# Patient Record
Sex: Male | Born: 1960 | Race: White | Hispanic: No | Marital: Married | Smoking: Never smoker
Health system: Southern US, Academic
[De-identification: ages and names within clinical notes are randomized; demographics above are authoritative.]

## PROBLEM LIST (undated history)

## (undated) DIAGNOSIS — S99919A Unspecified injury of unspecified ankle, initial encounter: Secondary | ICD-10-CM

## (undated) DIAGNOSIS — T148XXA Other injury of unspecified body region, initial encounter: Secondary | ICD-10-CM

## (undated) DIAGNOSIS — E785 Hyperlipidemia, unspecified: Secondary | ICD-10-CM

## (undated) DIAGNOSIS — S8990XA Unspecified injury of unspecified lower leg, initial encounter: Secondary | ICD-10-CM

## (undated) DIAGNOSIS — R42 Dizziness and giddiness: Secondary | ICD-10-CM

## (undated) DIAGNOSIS — K219 Gastro-esophageal reflux disease without esophagitis: Secondary | ICD-10-CM

## (undated) DIAGNOSIS — Z9989 Dependence on other enabling machines and devices: Secondary | ICD-10-CM

## (undated) DIAGNOSIS — Z973 Presence of spectacles and contact lenses: Secondary | ICD-10-CM

## (undated) DIAGNOSIS — G4730 Sleep apnea, unspecified: Secondary | ICD-10-CM

## (undated) DIAGNOSIS — E119 Type 2 diabetes mellitus without complications: Secondary | ICD-10-CM

## (undated) HISTORY — PX: HX CATARACT REMOVAL: SHX102

## (undated) HISTORY — PX: HX HERNIA REPAIR: SHX51

## (undated) HISTORY — DX: Unspecified injury of unspecified ankle, initial encounter: S99.919A

## (undated) HISTORY — PX: HX ORCHIOPEXY: SHX67

## (undated) HISTORY — PX: HX OTHER: 2100001105

## (undated) HISTORY — DX: Other injury of unspecified body region, initial encounter: T14.8XXA

## (undated) HISTORY — PX: HX WISDOM TEETH EXTRACTION: SHX21

## (undated) HISTORY — PX: HX TONSILLECTOMY: SHX27

## (undated) HISTORY — PX: KNEE ARTHROSCOPY: SUR90

## (undated) HISTORY — DX: Unspecified injury of unspecified lower leg, initial encounter: S89.90XA

---

## 1999-01-16 ENCOUNTER — Ambulatory Visit (HOSPITAL_COMMUNITY): Payer: Self-pay

## 1999-02-01 ENCOUNTER — Ambulatory Visit (INDEPENDENT_AMBULATORY_CARE_PROVIDER_SITE_OTHER): Payer: Self-pay | Admitting: NEUROSURGERY

## 1999-03-01 ENCOUNTER — Ambulatory Visit (INDEPENDENT_AMBULATORY_CARE_PROVIDER_SITE_OTHER): Payer: Self-pay | Admitting: NEUROSURGERY

## 2003-01-26 ENCOUNTER — Ambulatory Visit (HOSPITAL_COMMUNITY): Payer: Self-pay

## 2003-11-03 ENCOUNTER — Emergency Department (HOSPITAL_COMMUNITY): Payer: Self-pay | Admitting: EXTERNAL

## 2007-12-20 ENCOUNTER — Ambulatory Visit (INDEPENDENT_AMBULATORY_CARE_PROVIDER_SITE_OTHER): Payer: Worker's Compensation | Admitting: PREVENTIVE MEDICINE

## 2007-12-20 DIAGNOSIS — K429 Umbilical hernia without obstruction or gangrene: Secondary | ICD-10-CM

## 2007-12-26 NOTE — H&P (Signed)
[facility] Department of Occupational Medicine  [address]  [place], [place] [address]      INDEPENDENT MEDICAL EVALUATION    PATIENT NAME: [name], [name]  CHART NUMBER: [id number]  DATE OF BIRTH: 03-27-1961  DATE OF SERVICE: 12/20/2007    CLAIM NUMBER:   [id number]  DATE OF INJURY:  08/27/2007    At the onset of our visit, the claimant was reminded they are here for an independent medical evaluation. The claimant was advised that we are not, nor would we become, their treating physician under this claim. Additionally, the claimant was advised that whatever information they provided in relation to this claim would be included in our report.  The claimant was asked, as the examination proceeded, to perform maneuvers to the best of their ability; however, they were not to perform any maneuver which might result in further injury.      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  While he was lifting the hood on his truck, he felt a tearing sensation in his abdomen and developed pain in his abdomen suddenly.  That pain persisted and went to the emergency department at [facility].  They diagnosed him with an umbilical hernia.  He was referred to Dr. Okey Dupre, a surgeon, who a few weeks later took him to the operating room and did surgery in late January 2009 with a mesh repair.  He had a few postop followup visits with Dr. Okey Dupre, but developed no complications and then on 10/14/2007, he returned to work without limitations.    At the present time, he has no complaints and no limitations in his ability to perform his usual work.    REVIEW OF SYSTEMS:  A complete review of systems is available in the claimant's POC chart.  Pertinent positives include history of  earaches and some sinus difficulties.  He does have a history of fractures, plus the hernia, which he lists under musculoskeletal, but with this hernia, no disk problems.  He does have a history of weight gain and reports frequent worry.  The balance of his review of systems is negative.    CURRENT MEDICATIONS:  None.      ALLERGIES:  He reports no drug, food or environmental allergies.    SOCIAL HISTORY:  The claimant denies use of alcohol, having quit in 1988.  Denies use of smoked and smokeless tobacco, as well as illicit drugs.  He reports he has his bachelor's degree.  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  He did serve in [employer] for four years.  He is not retired and is not on Hydrologist.    PAST SURGICAL HISTORY:  History of broken thumb, the hernia, and wisdom teeth removal.    FAMILY HISTORY:  Father deceased from an injury and Mother deceased from cancer.  He has no siblings.    PHYSICAL EXAMINATION:  The claimant was alert and cooperative with evaluation.  His vital signs include a height of 5 feet 8 inches, weight 346 pounds, temperature 37.3, pulse 80, respirations 16 and blood pressure 144/84.      Examination was limited to the belly starting with inspection.  In an arc just inferior to the umbilicus, there is a scar that is approximately 7 cm in overall length.  This scar is still red.  It is not raised or hypertrophied and was normally mobile.  Palpation of the area about the belly button finds that there is a palpable nodule to the right and superior to the umbilicus that is approximately 1 cm in diameter, this was somewhat rubbery in consistency.  Careful palpation of the area around the umbilicus, I was not able to identify any defect in the abdominal wall that persists.    REVIEW OF PROVIDED MEDICAL RECORDS:  Provided medical records start with a referral letter, although I do not seem to have any questions.   I just have the scheduling letter with the claimant from [facility].  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 as a Nurse, mental health.  The physician diagnosis was of abdominal pain with abdominal herniation and the type of injury was stretched skin, 553.9.      The attending physician's report dated 10/01/2007, reflects that he had repair of an incarcerated umbilical hernia with Ventralex dual mesh patch of 6.5 cm in diameter on 09/13/2007 and seen postop 09/18/2007.  He had some Cipro perioperatively and Vicodin with soreness and inability to bend.      I have the pathology report dated 09/13/2007, and their impression was that it was incarcerated omentum from an umbilical hernia.      The operative report dated 09/13/2007 reflects the diagnosis of an umbilical hernia, incarcerated, with protuberant 3 inches with excoriated skin and then the postoperative diagnosis was the same with also 2 cm fascial defect found.  The operation was repair of incarcerated umbilical hernia with Ventralex dual mesh patch 6.5 cm in diameter done by Dr. Brayton El.      I have appropriate operative report and postoperative followup notes plus emergency department note reflecting their diagnosis of, I believe, it says medvulate hernia.      Return to work notice reflects return to work on 10/21/2007.      I do have the referral letter, dated 11/20/2007, which asks the three standard questions.    DIAGNOSIS:  Umbilical hernia, status post repair.    PROGNOSIS:  This gentleman has returned to his pre-injury job at full duty and has been able to do that for the last two months without limitation. I have no reason to expect that this would change.    REVIEW AND RECOMMENDATIONS:  1.  DOES THE CLAIMANT HAVE A WORK-RELATED INJURY OR ILLNESS?  It does seem quite reasonable that the type of work he was doing with having to lift the hood of a vehicle could result in  an umbilical hernia or certainly incarceration of an umbilical hernia, such as this gentleman had.  I have no evidence suggesting another cause, although his morbid obesity could certainly contribute to the development of such a condition.    2.  TREATMENTS AND/OR CONSULTATIONS NEEDED:  At this point, I do not believe any additional treatment or consultations will be necessary for this gentleman.    3.  IS THE CLAIMANT AT MAXIMUM MEDICAL IMPROVEMENT?  It is the opinion of this evaluating physician that this claimant is at maximum medical improvement with relation to the injuries covered under this claim.    4.  CLAIMANT'S WORK STATUS:  This gentleman has returned to his pre-injury job  and is doing that at full duty and has for the last several months.    5.  LIMITATIONS.  At this point, this gentleman's limitations would be none.      6.  WOULD THE CLAIMANT BENEFIT FROM WORK CONDITIONING OR WORK HARDENING?  I do not believe this gentleman would benefit from work conditioning or work hardening as he has returned to his pre-injury job without limitations.    7.  DOES THE CLAIMANT CURRENTLY MEET THE CRITERIA FOR TTD?  At this point, I believe this gentleman is eligible for neither medical nor vocational TTD benefits.    8.  PERMANENT IMPAIRMENT CALCULATIONS:  This claimant was evaluated in accordance with the AMA Guides to the Evaluation of Permanent Impairment, the Fourth Edition.  There are two residual defects that need to be considered.      The first of these would be the abdominal wall defect itself.  Abdominal wall hernias are evaluated using Table 7 on Page 243 of The Guides.  To qualify for Class 1 requires a palpable defect of supporting structures of the abdominal wall and a slight protrusion defect with increased abdominal pressure, easily reducible, or occasional mild discomfort at the site of the defect, but not precluding normal activity.  My examination reveals that this gentleman does not have a  palpable defect and also does have very occasional discomfort that does not preclude any activities.  Therefore, I believe we could place him in Class 1.  Class 1 does allow a 0-9% impairment of the whole person.  It is my opinion that there is a 0% impairment of the whole person.  This gentleman is quite similar to the example offered for Class 1 further down Page 247.    The second defect that must be considered is that to his skin from the incision.  This claimant had a 7-cm curvilinear scar that is an area that would be exposed by normal male bathing attire.  Evaluation of skin disorders is done using Table 2 on Page 280 of The Guides, and you would place him in Class 1 of that table.  There is a long series of examples of findings of skin changes, and it is my opinion that the scar, which is in a potentially visible area, which I expect will continue to become less and less visible as we get further out from the surgery is most similar to the examples listed on Page 281 of The Guides, which yields a 0% impairment of the whole person.  This gentleman has a 0% impairment of the whole person for his scar.      In summary, a 0% impairment of the whole person for abdominal wall defect combined with 0% impairment of the whole person for skin defects and that yields a 0% impairment of the whole person for all injuries covered under this claim.    9.  WOULD THE CLAIMANT BENEFIT FROM A VOCATIONAL REHABILITATION ASSESSMENT?  At this point, vocational rehabilitation would not be necessary for this gentleman as he has returned to his pre-injury employment at full duty.      Verlin Dike III, DO  Assistant Clinical Professor  [facility] Department of Occupational Medicine    MV/HQI/[id number]; D: 12/22/2007 84:13:24; T: 12/22/2007 08:48:38  Time:  Records 15 minutes. IME 1.25 Hours

## 2009-12-17 ENCOUNTER — Emergency Department (EMERGENCY_DEPARTMENT_HOSPITAL): Payer: Self-pay

## 2009-12-17 ENCOUNTER — Emergency Department (EMERGENCY_DEPARTMENT_HOSPITAL): Admission: EM | Admit: 2009-12-17 | Discharge: 2009-12-17 | Payer: Self-pay

## 2010-07-20 ENCOUNTER — Observation Stay (HOSPITAL_BASED_OUTPATIENT_CLINIC_OR_DEPARTMENT_OTHER): Payer: 59 | Admitting: HOSPITALIST

## 2010-07-20 ENCOUNTER — Emergency Department (EMERGENCY_DEPARTMENT_HOSPITAL): Payer: 59

## 2010-07-20 ENCOUNTER — Observation Stay
Admission: EM | Admit: 2010-07-20 | Discharge: 2010-07-22 | Disposition: A | Discharge status: Home / Self Care | Payer: 59 | Attending: HOSPITALIST | Admitting: HOSPITALIST

## 2010-07-20 ENCOUNTER — Encounter (HOSPITAL_COMMUNITY): Payer: Self-pay

## 2010-07-20 DIAGNOSIS — R072 Precordial pain: Principal | ICD-10-CM | POA: Insufficient documentation

## 2010-07-20 DIAGNOSIS — E785 Hyperlipidemia, unspecified: Secondary | ICD-10-CM | POA: Insufficient documentation

## 2010-07-20 DIAGNOSIS — E119 Type 2 diabetes mellitus without complications: Secondary | ICD-10-CM | POA: Insufficient documentation

## 2010-07-20 DIAGNOSIS — I10 Essential (primary) hypertension: Secondary | ICD-10-CM | POA: Insufficient documentation

## 2010-07-20 DIAGNOSIS — G4730 Sleep apnea, unspecified: Secondary | ICD-10-CM | POA: Insufficient documentation

## 2010-07-20 HISTORY — DX: Hyperlipidemia, unspecified: E78.5

## 2010-07-20 HISTORY — DX: Sleep apnea, unspecified: G47.30

## 2010-07-20 HISTORY — DX: Morbid (severe) obesity due to excess calories: E66.01

## 2010-07-20 LAB — BASIC METABOLIC PANEL
ANION GAP: 7 mmol/L (ref 5–16)
BUN/CREAT RATIO: 15 (ref 6–22)
BUN: 15 mg/dL (ref 8–26)
CALCIUM: 8.2 mg/dL — ABNORMAL LOW (ref 8.5–10.4)
CARBON DIOXIDE: 24 mmol/L (ref 23–33)
CHLORIDE: 106 mmol/L (ref 96–111)
CREATININE: 1.01 mg/dL (ref 0.62–1.27)
ESTIMATED GLOMERULAR FILTRATION RATE: >59 ml/min/1.73m2 (ref >59)
GLUCOSE,NONFAST: 132 mg/dL (ref 65–139)
POTASSIUM: 5.7 mmol/L — ABNORMAL HIGH (ref 3.5–5.1)
SODIUM: 137 mmol/L (ref 136–145)

## 2010-07-20 LAB — VENOUS BLOOD GAS/CO-OX - OP ONLY
%FIO2: 97 %
BASE DEFICIT: 0.6 mmol/L (ref 0.0–2.0)
BICARBONATE: 23.5 mmol/L (ref 22.0–26.0)
CARBOHYHEMOGLOBIN: 1.6 % — ABNORMAL HIGH (ref 0.0–1.5)
HEMATOCRIT: 46 % (ref 39.8–50.2)
HEMOGLOBIN: 15.4 g/dL (ref 13.5–18.0)
MET-HEMOGLOBIN: 0 % (ref 0.0–3.0)
O2CT: 14.6 (ref 7.5–17.5)
O2HB: 67.7 % (ref 40.0–70.0)
PCO2: 43 mmHg (ref 41.0–51.0)
PH: 7.37 (ref 7.310–7.410)
PO2: 41 mmHg (ref 35–50)

## 2010-07-20 LAB — CBC/DIFF
BASOPHILS: 0 % (ref 0–1)
BASOS ABS: 0.024 THOU/uL (ref 0.0–0.2)
EOS ABS: 0.135 THOU/uL (ref 0.1–0.3)
EOSINOPHIL: 1 % (ref 1–6)
HCT: 43.3 % (ref 39.8–50.2)
HGB: 14.3 g/dL (ref 13.1–17.3)
LYMPHOCYTES: 24 % (ref 20–45)
LYMPHS ABS: 2.64 THOU/uL (ref 1.0–4.8)
MCH: 29.9 pg (ref 27.4–33.0)
MCHC: 33 g/dL (ref 31.6–35.5)
MCV: 90.5 fL (ref 82.0–99.0)
MONOCYTES: 5 % (ref 4–13)
MONOS ABS: 0.552 THOU/uL (ref 0.1–0.9)
MPV: 7 FL — ABNORMAL LOW (ref 7.4–10.4)
NRBC'S: 0 /100{WBCs}
PLATELET COUNT: 278 10*3/uL (ref 140–450)
PMN ABS: 7.64 THOU/uL (ref 1.5–7.7)
PMN'S: 70 % (ref 40–75)
RBC: 4.79 MIL/uL (ref 4.46–5.70)
RDW: 13.2 % (ref 10.2–14.0)
WBC: 11 THOU/uL (ref 3.5–11.0)

## 2010-07-20 LAB — PT/INR
INR: 1 (ref 0.8–1.2)
PROTHROMBIN TIME: 10.8 s (ref 9.1–11.5)

## 2010-07-20 LAB — CREATINE KINASE (CK), MB FRACTION, SERUM
CK-MB: 2.4 ng/mL (ref <6.4)
MB INDEX: 1.4 % (ref 0.0–5.0)

## 2010-07-20 LAB — D-DIMER: D-DIMER (QUANT): 0.33 mg{FEU}/L (ref <0.59)

## 2010-07-20 LAB — TROPONIN-I (FOR ED ONLY): TROPONIN-I: <0.01 ng/mL (ref <0.030)

## 2010-07-20 LAB — CREATINE KINASE (CK), TOTAL, SERUM OR PLASMA: CREATINE KINASE (CK): 177 U/L (ref 48–222)

## 2010-07-20 LAB — PTT (PARTIAL THROMBOPLASTIN TIME): APTT: 21.3 s — ABNORMAL LOW (ref 22.0–32.0)

## 2010-07-20 MED ORDER — HYDROMORPHONE (PF) 1 MG/ML INJECTION SOLUTION
0.2000 mg | INTRAMUSCULAR | Status: DC | PRN
Start: 2010-07-20 — End: 2010-07-23

## 2010-07-20 MED ORDER — OLMESARTAN 20 MG TABLET
40.0000 mg | ORAL_TABLET | Freq: Every day | ORAL | Status: DC
Start: 2010-07-21 — End: 2010-07-23
  Administered 2010-07-21 – 2010-07-22 (×2): 40 mg via ORAL
  Filled 2010-07-20 (×2): qty 2

## 2010-07-20 MED ORDER — ENOXAPARIN 40 MG/0.4 ML SUBCUTANEOUS SYRINGE
40.0000 mg | INJECTION | SUBCUTANEOUS | Status: DC
Start: 2010-07-21 — End: 2010-07-23
  Administered 2010-07-21 – 2010-07-22 (×2): 40 mg via SUBCUTANEOUS
  Filled 2010-07-20 (×3): qty 0.4

## 2010-07-20 MED ORDER — NITROGLYCERIN 0.4 MG SUBLINGUAL TABLET
0.4000 mg | SUBLINGUAL_TABLET | SUBLINGUAL | Status: DC | PRN
Start: 2010-07-20 — End: 2010-07-23

## 2010-07-20 MED ORDER — SODIUM CHLORIDE 0.9 % INTRAVENOUS SOLUTION
INTRAVENOUS | Status: DC
Start: 2010-07-21 — End: 2010-07-21

## 2010-07-20 MED ORDER — HYDROCHLOROTHIAZIDE 25 MG TABLET
25.0000 mg | ORAL_TABLET | Freq: Every day | ORAL | Status: DC
Start: 2010-07-21 — End: 2010-07-21
  Administered 2010-07-21: 25 mg via ORAL
  Filled 2010-07-20: qty 1

## 2010-07-20 MED ORDER — SODIUM CHLORIDE 0.9 % (FLUSH) INJECTION SYRINGE
0.5000 mL | INJECTION | INTRAMUSCULAR | Status: DC | PRN
Start: 2010-07-20 — End: 2010-07-23

## 2010-07-20 MED ORDER — OMEGA-3 FATTY ACIDS-VITAMIN E 1,000 MG CAPSULE
1000.00 mg | ORAL_CAPSULE | Freq: Two times a day (BID) | ORAL | Status: DC
Start: 2010-07-21 — End: 2010-07-23
  Administered 2010-07-21 (×2): 0 mg via ORAL
  Administered 2010-07-21: 1000 mg via ORAL
  Administered 2010-07-22: 0 mg via ORAL
  Filled 2010-07-20 (×5): qty 1

## 2010-07-20 MED ORDER — SENNOSIDES 8.6 MG-DOCUSATE SODIUM 50 MG TABLET
1.0000 | ORAL_TABLET | Freq: Two times a day (BID) | ORAL | Status: DC | PRN
Start: 2010-07-20 — End: 2010-07-23
  Filled 2010-07-20 (×3): qty 1

## 2010-07-20 MED ORDER — POLYETHYLENE GLYCOL 3350 17 GRAM ORAL POWDER PACKET
17.0000 g | Freq: Every day | ORAL | Status: DC | PRN
Start: 2010-07-20 — End: 2010-07-23
  Filled 2010-07-20 (×3): qty 1

## 2010-07-20 MED ORDER — MULTIVITAMIN TABLET
1.0000 | ORAL_TABLET | Freq: Every day | ORAL | Status: DC
Start: 2010-07-21 — End: 2010-07-23
  Administered 2010-07-21 – 2010-07-22 (×2): 1 via ORAL
  Filled 2010-07-20 (×2): qty 1

## 2010-07-20 MED ORDER — ASPIRIN 81 MG TABLET
81.0000 mg | ORAL_TABLET | Freq: Every day | ORAL | Status: DC
Start: 2010-07-21 — End: 2010-07-20

## 2010-07-20 MED ORDER — DOCUSATE SODIUM 100 MG CAPSULE
100.0000 mg | ORAL_CAPSULE | Freq: Two times a day (BID) | ORAL | Status: DC | PRN
Start: 2010-07-20 — End: 2010-07-23
  Filled 2010-07-20 (×3): qty 1

## 2010-07-20 MED ORDER — SODIUM CHLORIDE 0.9 % (FLUSH) INJECTION SYRINGE
1.0000 mL | INJECTION | Freq: Three times a day (TID) | INTRAMUSCULAR | Status: DC
Start: 2010-07-20 — End: 2010-07-23
  Administered 2010-07-21: 0 mL
  Administered 2010-07-21 (×2): 1 mL
  Administered 2010-07-21: 0 mL
  Administered 2010-07-22: 1 mL

## 2010-07-20 MED ORDER — ONDANSETRON HCL (PF) 4 MG/2 ML INJECTION SOLUTION
4.0000 mg | Freq: Four times a day (QID) | INTRAMUSCULAR | Status: DC | PRN
Start: 2010-07-20 — End: 2010-07-23

## 2010-07-20 MED ORDER — ASPIRIN 81 MG CHEWABLE TABLET
81.0000 mg | CHEWABLE_TABLET | Freq: Every day | ORAL | Status: DC
Start: 2010-07-21 — End: 2010-07-23
  Administered 2010-07-21 – 2010-07-22 (×2): 81 mg via ORAL
  Filled 2010-07-20 (×2): qty 1

## 2010-07-20 MED ORDER — INSULIN LISPRO 100 UNIT/ML SUB-Q SSIP
2.00 [IU] | INJECTION | Freq: Four times a day (QID) | SUBCUTANEOUS | Status: DC | PRN
Start: 2010-07-20 — End: 2010-07-23
  Administered 2010-07-21 – 2010-07-22 (×3): 4 [IU] via SUBCUTANEOUS
  Filled 2010-07-20: qty 3

## 2010-07-20 NOTE — ED Nurses Note (Signed)
1930  Pt in bed, alert and oriented x3.  Pt denies chest pain.  Pt c/o back pain, rate 1 on pain scale 0-10.  Denies shortness of breath.  Monitor showing sr rate 94.  Skin warm, dry and pink.  o2 sat 96% on ra. Pt verbalized wanting to eat. Pt stated he has not eaten since 8am.  Notified  Resident of pt's needs. Order received to feed pt.  Medicine consulted for admission.  Hm

## 2010-07-20 NOTE — H&P (Signed)
[facility]   General Medicine  Admission H&P    Date of Service:  07/20/2010  [name], [name], 49 y.o. 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  The patient had not exerted himself prior to the episode of pain.   The patient notes the pain felt like a pressure, as if someone was push against his chest wall.  Notes a generalized tingling feeling, but no focal weakness or numbness.  Denies jaw pain or arm pain.  No similar previous episodes noted.  Notes some nausea, but no vomiting or change in bowel habits.  Denies abdominal pain.  No SOB.  Denies cough, fever, chills or diaphoresis.  The pain resolved when he received nitroglycerin SL x 2.  The patient denies previous cardiac disease.  The patient has noted risk factors of morbid obesity, diabetes, HTN, and hyperlipidemia.  He denies a family history of CAD and tobacco use.  The patient notes he had a stress test a "few years back", but cannot recall when, but he states the test was "normal".          PAST MEDICAL:    Past Medical History   Diagnosis Date   . HTN    . Diabetes    . Sleep apnea    . Hyperlipidemia    . Morbid obesity        Past Surgical History   Procedure Date   . Hx hernia repair      inguinal and umbilical repair    . Hx wisdom teeth extraction        Medication Sig   . Liraglutide (VICTOZA) 0.6 mg/0.1 mL (18 mg/3 mL) Subcutaneous Pen Injector 1.2 mg by Subcutaneous route Once a day.   . Metformin (GLUCOPHAGE) 1,000 mg Oral Tablet take 1,000 mg by mouth Twice daily with food.   . Olmesartan-Hydrochlorothiazide (BENICAR HCT) 40-12.5 mg Oral Tablet take 1 Tab by mouth Once a day.    . Glipizide (GLUCOTROL) 10 mg Oral Tablet take 10 mg by mouth Daily before Breakfast. Take 30 minutes before meals    . Aspirin 81 mg Oral Tablet take 81 mg by mouth Once a day.   . Omega-3 Fatty Acids-Vitamin E (FISH OIL) 1,000 mg Oral Capsule take 1,000 mg by mouth Twice daily.   . multivitamin Oral Tablet take 1 Tab by mouth Once a day.   . hydrochlorothiazide (HYDRODIURIL) 25 mg Oral Tablet take 25 mg by mouth Once a day.     Allergies   Allergen Reactions   . Tylox (Oxycodone-acetaminophen)        Family History  Family History   Problem Relation Age of Onset   . Diabetes Paternal Uncle        Social History  History   Substance Use Topics   . Smoking status: Never Smoker    . Smokeless tobacco: Not on file   . Alcohol Use: No          ROS: Other than ROS in the HPI, all other systems were negative.    Examination:  Temperature: 37 C (98.6 F)  Heart Rate: 96   BP (Non-Invasive): 142/65 mmHg  Respiratory  Rate: 20   SpO2-1: 96 %  Pain Score: 5  General: morbidly obese and no distress  Eyes: Conjunctiva clear.  HENT:Head atraumatic and normocephalic  Neck: No JVD  Lungs: Clear to auscultation bilaterally.   Cardiovascular: regular rate and rhythm  S1, S2 normal     no reproducible chest pain no noted murmurs  Abdomen: Soft, non-tender, Bowel sounds normal, obese  Genito-urinary: Deferred  Extremities: No cyanosis or edema  Skin: Skin warm and dry  Neurologic: Grossly normal  Psychiatric: Normal    Labs:    I have reviewed all lab results.    EKG:  SR with RsR`, left atrial abnormality, and non-specific t wave changes     Imaging Studies:  I have reviewed all imaging studies.      DNR Status: Full Code     Assessment/Plan:   Active Hospital Problems   (*Primary Problem)   Diagnoses   . 1Chest pain   . HTN (hypertension)     Chronic   . Diabetes mellitus     Chronic   . Hyperlipidemia     Chronic   . Morbid obesity     Chronic   . Sleep apnea     Chronic        1.  Chest pain, may be cardiac in nature, currently asymptomatic, however, with patient's multiple risk factors (morbid obesity, HTN, hyperlipidemia, and diabetes), will plan to check serial cardiac enzymes and MPS in AM.  Plan for NPO after midnight.  Will check A1C and lipid panel in AM.      2.  HTN, chronic, continue home therapy  3.  Diabetes, will hold oral agents, fingersticks and sliding scale, check A1C in Am  4.  Hyperlipidemia, patient states he is not on statin, will continue omega fish oil, plan to check lipid profile in AM, may need statin therapy  5.  Sleep apnea, will continue CPAP at night  6.  Morbid obesity, will place on diabetic diet     DVT/PE Prophylaxis: Lovenox    Sanjuana Mae, MD 07/20/2010, 9:49 PM

## 2010-07-20 NOTE — ED Nurses Note (Signed)
Pt report called to 7E and given to nurse Lolita Lenz, RN.  hm

## 2010-07-20 NOTE — ED Provider Notes (Signed)
HPI Comments:           [name] is a 49 y.o. Male ho presents to the ER with symptom of cheatpain and back pain. He states that about an hour ago while he was driving he developed suddent onset of chest pain and back pain. He states that his cheat pain was retrosternal,dull aching,3/10 intensity along with nausea. Back pain was cramping pain,10/10 intensity,radiating to shoulders. He states he also had blurring of vision and whole body numbness.    Non SOB,abdominal pain,bladder and bowel disturbances,seizurs,LOS,fever,headaches.    Past Medical History:    HTN                                                           DIABETES                                                      SLEEP APNEA                                                   Past Surgical History:    Hx hernia repair                                              Hx wisdom teeth extraction                                    Family history   Mother had cancer  Uncle- Diabetes and HTN    Social History: Live with is wife and daughter. Works as a Naval architect. Has mild erectile dysfunction. Denies nicotine,alcohol and illicit drug use.    Current outpatient prescriptions:  Liraglutide (VICTOZA) 0.6 mg/0.1 mL (18 mg/3 mL) Subcutaneous Pen Injector, 1.2 mg by Subcutaneous route Once a day.  Metformin (GLUCOPHAGE) 1,000 mg Oral Tablet, take 1,000 mg by mouth Twice daily with food.  Olmesartan-Hydrochlorothiazide (BENICAR HCT) 40-12.5 mg Oral Tablet, take 1 Tab by mouth Once a day.  Glipizide (GLUCOTROL) 10 mg Oral Tablet, take 10 mg by mouth Daily before Breakfast. Take 30 minutes before meals   Aspirin 81 mg Oral Tablet, take 81 mg by mouth Once a day.  Omega-3 Fatty Acids-Vitamin E (FISH OIL) 1,000 mg Oral Capsule, take 1,000 mg by mouth Twice daily.  multivitamin Oral Tablet, take 1 Tab by mouth Once a day.  hydrochlorothiazide (HYDRODIURIL) 25 mg Oral Tablet, take 25 mg by mouth Once a day.           Review of Systems    Cardiovascular: Positive for chest pain.   Musculoskeletal: Positive for back pain.   All other systems reviewed and are negative.      Physical Exam   Constitutional: He is oriented to person, place, and time. He appears well-developed and well-nourished.   HENT:  Head: Normocephalic and atraumatic.   Eyes: Conjunctivae and EOM are normal. Pupils are equal, round, and reactive to light.   Neck: Normal range of motion. Neck supple.   Cardiovascular: Regular rhythm, normal heart sounds and intact distal pulses.  Tachycardia present.    Pulmonary/Chest: Effort normal and breath sounds normal.   Abdominal: Soft.   Musculoskeletal: Normal range of motion.   Neurological: He is alert and oriented to person, place, and time. No cranial nerve deficit.   Skin: Skin is warm.   Psychiatric: He has a normal mood and affect. His behavior is normal. Judgment and thought content normal.     ED Course:    Results:  Recent Results   -CBC/DIFF     WBC (THOU/uL)                 11.0      Low: 3.5  High: 11.0     RBC (MIL/uL)                  4.79      Low: 4.46  High: 5.70     HGB (g/dL)                    16.1      Low: 13.1  High: 17.3     HCT (%)                       43.3      Low: 39.8  High: 50.2     MCV (fL)                      90.5      Low: 82.0  High: 99.0     MCH (pg)                      29.9      Low: 27.4  High: 33.0     MCHC (g/dL)                   09.6      Low: 31.6  High: 35.5     RDW (%)                       13.2      Low: 10.2  High: 14.0     PLATELET COUNT (THOU/uL)      278       Low: 140  High: 450     MPV (FL)                      7.0 (*)   Low: 7.4  High: 10.4     PMN'S (%)                     70        Low: 40   High: 75     PMN ABS (THOU/uL)             7.640     Low: 1.5  High: 7.7     LYMPHOCYTES (%)               24        Low: 20   High: 45     LYMPHS ABS (THOU/uL)          2.640     Low:  1.0  High: 4.8     MONOCYTES (%)                 5         Low: 4    High: 13      MONOS ABS (THOU/uL)           0.552     Low: 0.1  High: 0.9     EOSINOPHIL (%)                1         Low: 1    High: 6      EOS ABS (THOU/uL)             0.135     Low: 0.1  High: 0.3     BASOPHILS (%)                 0         Low: 0    High: 1      BASOS ABS (THOU/uL)           0.024     Low: 0.0  High: 0.2     NRBC'S (/100WBC)              0       Range: 0    -BASIC METABOLIC PANEL, NON-FASTING     SODIUM (mmol/L)               137       Low: 136  High: 145     POTASSIUM (mmol/L)            5.7 (*)   Low: 3.5  High: 5.1     CHLORIDE (mmol/L)             106       Low: 96   High: 111     CARBON DIOXIDE (mmol/L)       24        Low: 23   High: 33     ANION GAP (mmol/L)            7         Low: 5    High: 16     CREATININE (mg/dL)            5.40      Low: 0.62  High: 1.27     ESTIMATED GLOMERULAR FILTRATION RA* (ml/min/1.44m2)  >59     Range: >59     GLUCOSE,NONFAST (mg/dL)       981       Low: 65   High: 139     BUN (mg/dL)                   15        Low: 8    High: 26     BUN/CREAT RATIO               15        Low: 6    High: 22     CALCIUM (mg/dL)               8.2 (*)   Low: 8.5  High: 10.4    -PT/INR     PROTHROMBIN TIME (Sec)        10.8      Low: 9.1  High: 11.5     INR  1.0       Low: 0.8  High: 1.2    -ROUTINE PTT     aPTT (Sec)                    21.3 (*)  Low: 22.0  High: 32.0    -TROPONIN-I (FOR ED ONLY)     TROPONIN-I (ng/mL)            <0.010  Range: <0.030    -CREATINE KINASE (CK), TOTAL     CREATINE KINASE (CK) (U/L)    177       Low: 48   High: 222    -CREATINE KINASE (CK) MB ISOENZYME     CK-MB (ng/mL)                 2.4     Range: <6.4     MB INDEX (%)                  1.4       Low: 0.0  High: 5.0    -D-DIMER     D-DIMER (QUANT) (mg/L FEU)    0.33    Range: <0.59    -VENOUS BLOOD GAS/COOXIMETRY     %FIO2 (%)                     97                               PH                            7.370     Low: 7.310  High: 7.410      PCO2 (mm Hg)                  43.0      Low: 41.0  High: 51.0     PO2 (mm Hg)                   41        Low: 35   High: 50     BICARBONATE (mmol/L)          23.5      Low: 22.0  High: 26.0     BASE EXCESS (mmol/L)                    Low: 0.0  High: 2.0                   Value:Test Not Performed     BASE DEFICIT (mmol/L)         0.6       Low: 0.0  High: 2.0     Specimen Type                 VENOUS                           HEMOGLOBIN (g/dL)             16.1      Low: 13.5  High: 18.0     O2HB (%)                      67.7      Low: 40.0  High: 70.0  CARBOHYHEMOGLOBIN (%)         1.6 (*)   Low: 0.0  High: 1.5     MET-HEMOGLOBIN (%)            0.0       Low: 0.0  High: 3.0     O2CT                          14.6      Low: 7.5  High: 17.5     RTCXV SPEC TYPE               VENOUS                           HEMATOCRIT (%)                46        Low: 39.8  High: 50.2      ED Course:  XR AP MOBILE CHEST  CBC/DIFF  BASIC METABOLIC PANEL, NON-FASTING  PT/INR  PTT (PARTIAL THROMBOPLASTIN TIME)  TROPONIN-I (FOR ED ONLY)  CPK (CK)  CK ISOENZYMES ONLY  D-DIMER  VENOUS BLOOD GAS/COOXIMETRY  ECG 12-LEAD  2 doses of sublingual nitrates given and patient states his chest pain is completely gone and has very minimal back pain.  Repeat EKG showed no progression or any significant changes.  Medicine consulted    Evaluation / Plan:  Disposition- Patient will be admitted to medicine for further evaluation and management.    Heath Gold, MD 07/21/2010, 12:13 AM

## 2010-07-20 NOTE — ED Nurses Note (Signed)
Pt given bag lunch, ate 100%.  Medicine consult at bedside. hm

## 2010-07-20 NOTE — ED Nurses Note (Signed)
1548:  Patient is alert and oriented x 3.  Respirations are even and non-labored.  Skin is pink, warm and dry.  Patient states he suddenly developed chest pain this evening while driving.  Patient denies shortness of breath, diaphoresis, nausea and vomiting.  Patient states the pain was relieved with NTG administered by EMS prior to his arrival here in the ED.  Patient placed on cardiac monitor, dinamapp, and pulse ox.  Patient made aware to notify me if chest pain reoccurs.  IV site, left ac, established by EMS.      1630:  Patient continues to deny chest pain.  Will continue to monitor.    1729:  Patient continues to deny chest pain.  Patient ambulated to restroom.    1908:  Patient care transferred with verbal nursing report to Lucia Gaskins, RN.

## 2010-07-20 NOTE — ED Nurses Note (Signed)
2140  Pt calm and relax, sitting up in bed. Monitor showing sr rate 90.  Given diet sprite as requested.  Hm

## 2010-07-20 NOTE — ED Attending Note (Signed)
I saw and examined the patient.  I directly supervised the patient's care.  I discussed the patient's care with the resident and I agree with the findings, diagnosis, and plan as documented in the resident's note.  Any exceptions, additions, or corrections are noted below.  49 yo M presents with sudden chest and back pain. Aching, nonradiating. Pain in the middle of his upper back also. Chest pain now gone after NTG.  Hx htn, DM.   Bedside US - no R DVT, heart without effusion, gross aortic root dilation, squeeze grossly OK, no obvious RV dilation  Will admit for rule out  MDM - re-evaluated pt, hemo monitoring, reviewed old records, reviewed labs, reviewed imaging, reviewed and interpreted ECGs, discussed care with consulting service, IV therapy was given

## 2010-07-21 ENCOUNTER — Inpatient Hospital Stay (HOSPITAL_COMMUNITY): Payer: 59

## 2010-07-21 LAB — CBC/DIFF
BASOPHILS: 0 % (ref 0–1)
BASOS ABS: 0.014 THOU/uL (ref 0.0–0.2)
EOS ABS: 0.171 THOU/uL (ref 0.1–0.3)
EOSINOPHIL: 2 % (ref 1–6)
HCT: 41.5 % (ref 39.8–50.2)
HGB: 13.8 g/dL (ref 13.1–17.3)
LYMPHOCYTES: 31 % (ref 20–45)
LYMPHS ABS: 3.11 THOU/uL (ref 1.0–4.8)
MCH: 30.1 pg (ref 27.4–33.0)
MCHC: 33.3 g/dL (ref 31.6–35.5)
MCV: 90.6 fL (ref 82.0–99.0)
MONOCYTES: 6 % (ref 4–13)
MONOS ABS: 0.599 THOU/uL (ref 0.1–0.9)
MPV: 7 FL — ABNORMAL LOW (ref 7.4–10.4)
NRBC'S: 0 /100{WBCs}
PLATELET COUNT: 276 THOU/uL (ref 140–450)
PMN ABS: 6.16 THOU/uL (ref 1.5–7.7)
PMN'S: 61 % (ref 40–75)
RBC: 4.58 MIL/uL (ref 4.46–5.70)
RDW: 13.1 % (ref 10.2–14.0)
WBC: 10.1 THOU/uL (ref 3.5–11.0)

## 2010-07-21 LAB — BASIC METABOLIC PANEL
ANION GAP: 11 mmol/L (ref 5–16)
BUN/CREAT RATIO: 16 (ref 6–22)
BUN: 15 mg/dL (ref 8–26)
CALCIUM: 8.3 mg/dL — ABNORMAL LOW (ref 8.5–10.4)
CARBON DIOXIDE: 24 mmol/L (ref 23–33)
CHLORIDE: 103 mmol/L (ref 96–111)
CREATININE: 0.96 mg/dL (ref 0.62–1.27)
ESTIMATED GLOMERULAR FILTRATION RATE: >59 ml/min/1.73m2 (ref >59)
GLUCOSE,NONFAST: 207 mg/dL — ABNORMAL HIGH (ref 65–139)
POTASSIUM: 3.2 mmol/L — ABNORMAL LOW (ref 3.5–5.1)
SODIUM: 138 mmol/L (ref 136–145)

## 2010-07-21 LAB — PHOSPHORUS: PHOSPHORUS: 2.3 mg/dL — ABNORMAL LOW (ref 2.4–4.7)

## 2010-07-21 LAB — LIPID PANEL
CHOLESTEROL: 162 mg/dL (ref <200)
HDL-CHOLESTEROL: 28 mg/dL — ABNORMAL LOW (ref >39)
LDL (CALCULATED): 79 mg/dL (ref <100)
NON - HDL (CALCULATED): 134 mg/dL (ref <190)
TRIGLYCERIDES: 274 mg/dL — ABNORMAL HIGH (ref <150)
VLDL (CALCULATED): 55 mg/dL — ABNORMAL HIGH (ref <30)

## 2010-07-21 LAB — HGA1C (HEMOGLOBIN A1C WITH EST AVG GLUCOSE)
ESTIMATED AVERAGE GLUCOSE: 206 mg/dL
HEMOGLOBIN A1C: 8.8 % — ABNORMAL HIGH (ref 4.0–6.0)

## 2010-07-21 LAB — TROPONIN-I
TROPONIN-I: <0.01 ng/mL (ref <0.030)
TROPONIN-I: <0.01 ng/mL (ref <0.030)
TROPONIN-I: <0.01 ng/mL (ref <0.030)

## 2010-07-21 LAB — PERFORM POC WHOLE BLOOD GLUCOSE
GLUCOSE, POINT OF CARE: 209 mg/dL — ABNORMAL HIGH (ref 70–105)
GLUCOSE, POINT OF CARE: 219 mg/dL — ABNORMAL HIGH (ref 70–105)

## 2010-07-21 LAB — MAGNESIUM: MAGNESIUM: 2.1 mg/dL (ref 1.7–2.5)

## 2010-07-21 MED ORDER — REGADENOSON 0.4 MG/5 ML INTRAVENOUS SYRINGE
0.40 mg | INJECTION | INTRAVENOUS | Status: AC
Start: 2010-07-21 — End: 2010-07-21
  Administered 2010-07-21: 0.4 mg via INTRAVENOUS

## 2010-07-21 MED ORDER — NITROGLYCERIN 0.4 MG SUBLINGUAL TABLET
0.40 mg | SUBLINGUAL_TABLET | SUBLINGUAL | Status: DC | PRN
Start: 2010-07-21 — End: 2011-12-04

## 2010-07-21 MED ORDER — REGADENOSON 0.4 MG/5 ML INTRAVENOUS SYRINGE
INJECTION | INTRAVENOUS | Status: AC
Start: 2010-07-21 — End: 2010-07-21
  Filled 2010-07-21: qty 5

## 2010-07-21 MED ORDER — METOPROLOL TARTRATE 25 MG TABLET
25.00 mg | ORAL_TABLET | Freq: Two times a day (BID) | ORAL | Status: DC
Start: 2010-07-21 — End: 2010-07-23
  Administered 2010-07-21 – 2010-07-22 (×2): 25 mg via ORAL
  Filled 2010-07-21 (×5): qty 1

## 2010-07-21 MED ORDER — NITROGLYCERIN 0.4 MG SUBLINGUAL TABLET
0.4000 mg | SUBLINGUAL_TABLET | SUBLINGUAL | Status: DC | PRN
Start: 2010-07-21 — End: 2010-07-21

## 2010-07-21 MED ORDER — POTASSIUM CHLORIDE ER 20 MEQ TABLET,EXTENDED RELEASE(PART/CRYST)
40.0000 meq | ORAL_TABLET | Freq: Once | ORAL | Status: AC
Start: 2010-07-21 — End: 2010-07-21
  Administered 2010-07-21: 40 meq via ORAL
  Filled 2010-07-21: qty 2

## 2010-07-21 MED ORDER — METOPROLOL TARTRATE 25 MG TABLET
25.00 mg | ORAL_TABLET | Freq: Two times a day (BID) | ORAL | Status: DC
Start: 2010-07-21 — End: 2018-04-03

## 2010-07-21 NOTE — Progress Notes (Signed)
[facility]  Discharge Day Note    [name]  Date of service: 07/21/2010  Date of Admission:  07/20/2010  Hospital Day:  LOS: 1 day       Examination at discharge:  Vital Signs:  Temperature: 36.9 C (98.4 F) (07/21/10 1558)  Heart Rate: 84  (07/21/10 1558)  BP (Non-Invasive): 129/69 mmHg (07/21/10 1558)  Respiratory Rate: 18  (07/21/10 1558)  SpO2-1: 96 % (07/21/10 1049)  Pain Score: 0 (07/21/10 1049)  General: no distress  HENT:Head atraumatic and normocephalic, ENT without erythema or injection, mucouse membranes moist.  Neck: No JVD or thyromegaly or lymphadenopathy  Lungs: Clear to auscultation bilaterally.   Cardiovascular: regular rate and rhythm, S1, S2 normal, no murmur, click, rub or gallop  Abdomen: Soft, non-tender, Bowel sounds normal, non-distended  Extremities: No cyanosis or edema  Neurologic: Grossly normal    Assessment/ Plan:   Active Hospital Problems   (*Primary Problem)   Diagnoses   . 1Chest pain   . HTN (hypertension)     Chronic   . Diabetes mellitus     Chronic   . Hyperlipidemia     Chronic   . Morbid obesity     Chronic   . Sleep apnea     Chronic       Discharge home.   Follow up with PCP and Cardiology.   Instructions given.   Start metoprolol and nitroglycerin prn.     Disposition : Home discharge      Darylene Price, MD 07/21/2010, 5:40 PM

## 2010-07-21 NOTE — Respiratory Therapy (Signed)
Placed pt on c-pap for sleep apnea.

## 2010-07-21 NOTE — Nurses Notes (Addendum)
Pt denies chest pain. Completed stress test. Pt radioactive till 9AM tomorrow Service notified test is completed  and diet requested. Assessment per [employer].

## 2010-07-21 NOTE — Progress Notes (Addendum)
[facility]  Medicine Progress Note    [name]  Date of service: 07/21/2010  Date of Admission:  07/20/2010    Hospital Day:  LOS: 1 day   Subjective:  Still complaining of funny sensation in the chest, but better than yesterday. Stress test results mentioned, wants to go home tomorrow, as he has no ride tonight.     Vital Signs:  Temp (24hrs) Max:37.2 C (99 F)      Systolic (24hrs), Avg:132 mmHg, Min:124 mmHg, Max:143 mmHg    Diastolic (24hrs), Avg:68 mmHg, Min:60 mmHg, Max:84 mmHg    Temp  Avg: 36.8 C (98.2 F)  Min: 36 C (96.8 F)  Max: 37.2 C (99 F)  Pulse  Avg: 88.5   Min: 81   Max: 96   Resp  Avg: 18.7   Min: 16   Max: 20   SpO2  Avg: 95.8 %  Min: 95 %  Max: 97 %  Pain Score: 0  Fi02    I/O:  I/O last 24 hours:    Intake/Output Summary (Last 24 hours) at 07/21/10 1854  Last data filed at 07/21/10 1500   Gross per 24 hour   Intake   1000 ml   Output   1100 ml   Net   -100 ml         Current facility-administered medications:  metoprolol (LOPRESSOR) tablet  25 mg Oral Q12H   multivitamin tablet  1 Tab Oral Daily   olmesartan (BENICAR) tablet  40 mg Oral Daily   Omega-3 Fatty Acids-Vitamin E 1,000 mg Cap 1,000 mg 1,000 mg Oral 2x/day   enoxaparin (LOVENOX) 40 mg/0.4 mL SubQ injection  40 mg Subcutaneous Q24H   docusate sodium (COLACE) capsule  100 mg Oral 2x/day PRN   polyethylene glycol (MIRALAX) oral packet  17 g Oral Daily PRN   sennosides-docusate sodium (SENOKOT-S) 8.6-50mg  per tablet  1 Tab Oral 2x/day PRN   ondansetron (ZOFRAN) 2 mg/mL injection  4 mg Intravenous Q6H PRN   NS 10 mL injection  1 mL Intracatheter Q8HRS   NS 10 mL injection  0.5 mL Intracatheter Q1 MIN PRN   HYDROmorphone (DILAUDID) 1 mg/mL injection  0.2 mg Intravenous Q4H PRN   nitroglycerin (NITROSTAT) sublingual tablet  0.4 mg Sublingual Q5 Min PRN   SSIP insulin lispro human (HUMALOG) 100 units/mL injection  2-6 Units Subcutaneous 4x/day PRN   aspirin chewable tablet 81 mg 81 mg Oral Daily          Allergies   Allergen Reactions   . Tylox (Oxycodone-acetaminophen)          Physical Exam:    General: no distress, moderately obese.  Lungs: Bilateral air entry present, Clear.   Cardiovascular: regular rate and rhythm, S1, S2 normal, no murmur, click, rub or gallop  Abdomen: Soft, non-tender, Bowel sounds normal, non-distended  Extremities: No cyanosis or edema      Labs:  Lab Results for Last 24 Hours:    Results for orders placed during the hospital encounter of 07/20/10 (from the past 24 hour(s))   TROPONIN-I    Collection Time    07/20/10 11:00 PM   Component Value Range   . TROPONIN-I <0.010  <0.030 (ng/mL)   HGA1C (HEMOGLOBIN A1C WITH EST AVG GLUCOSE)    Collection Time    07/20/10 11:00 PM   Component Value Range   . HEMOGLOBIN A1C 8.8 (*) 4.0 - 6.0 (%)   . ESTIMATED AVERAGE GLUCOSE 206  (mg/dL)  TROPONIN-I    Collection Time    07/21/10  3:00 AM   Component Value Range   . TROPONIN-I <0.010  <0.030 (ng/mL)   CBC/DIFF    Collection Time    07/21/10  6:00 AM   Component Value Range   . WBC 10.1  3.5 - 11.0 (THOU/uL)   . RBC 4.58  4.46 - 5.70 (MIL/uL)   . HGB 13.8  13.1 - 17.3 (g/dL)   . HCT 41.5  39.8 - 50.2 (%)   . MCV 90.6  82.0 - 99.0 (fL)   . MCH 30.1  27.4 - 33.0 (pg)   . MCHC 33.3  31.6 - 35.5 (g/dL)   . RDW 13.1  10.2 - 14.0 (%)   . PLATELET COUNT 276  140 - 450 (THOU/uL)   . MPV 7.0 (*) 7.4 - 10.4 (FL)   . PMN'S 61  40 - 75 (%)   . PMN ABS 6.160  1.5 - 7.7 (THOU/uL)   . LYMPHOCYTES 31  20 - 45 (%)   . LYMPHS ABS 3.110  1.0 - 4.8 (THOU/uL)   . MONOCYTES 6  4 - 13 (%)   . MONOS ABS 0.599  0.1 - 0.9 (THOU/uL)   . EOSINOPHIL 2  1 - 6 (%)   . EOS ABS 0.171  0.1 - 0.3 (THOU/uL)   . BASOPHILS 0  0 - 1 (%)   . BASOS ABS 0.014  0.0 - 0.2 (THOU/uL)   . NRBC'S 0  0 (/100WBC)   BASIC METABOLIC PANEL, NON-FASTING    Collection Time    07/21/10  6:00 AM   Component Value Range   . SODIUM 138  136 - 145 (mmol/L)   . POTASSIUM 3.2 (*) 3.5 - 5.1 (mmol/L)   . CHLORIDE 103  96 - 111 (mmol/L)    . CARBON DIOXIDE 24  23 - 33 (mmol/L)   . ANION GAP 11  5 - 16 (mmol/L)   . CREATININE 0.96  0.62 - 1.27 (mg/dL)   . ESTIMATED GLOMERULAR FILTRATION RATE >59  >59 (ml/min/1.19m2)   . GLUCOSE,NONFAST 207 (*) 65 - 139 (mg/dL)   . BUN 15  8 - 26 (mg/dL)   . BUN/CREAT RATIO 16  6 - 22    . CALCIUM 8.3 (*) 8.5 - 10.4 (mg/dL)   MAGNESIUM    Collection Time    07/21/10  6:00 AM   Component Value Range   . MAGNESIUM 2.1  1.7 - 2.5 (mg/dL)   PHOSPHORUS    Collection Time    07/21/10  6:00 AM   Component Value Range   . PHOSPHORUS 2.3 (*) 2.4 - 4.7 (mg/dL)   LIPID PANEL    Collection Time    07/21/10  6:00 AM   Component Value Range   . TRIGLYCERIDES 274 (*) <150 (mg/dL)   . CHOLESTEROL 162  <200 (mg/dL)   . HDL-CHOLESTEROL 28 (*) >39 (mg/dL)   . LDL (CALCULATED) 79  <100 (mg/dL)   . VLDL (CALCULATED) 55 (*) <30 (mg/dL)   . NON - HDL (CALCULATED) 134  <190 (mg/dL)   TROPONIN-I    Collection Time    07/21/10  7:00 AM   Component Value Range   . TROPONIN-I <0.010  <0.030 (ng/mL)   POCT WHOLE BLOOD GLUCOSE    Collection Time    07/21/10 10:52 AM   Component Value Range   . GLUCOSE, POINT OF CARE 209 (*) 70 - 105 (mg/dL)  Assessment/ Plan:   Active Hospital Problems   (*Primary Problem)   Diagnoses   . 1Chest pain   . HTN (hypertension)     Chronic   . Diabetes mellitus     Chronic   . Hyperlipidemia     Chronic   . Morbid obesity     Chronic   . Sleep apnea     Chronic       Plan:    1. Stress test was done and the results are reveiwed, mentioned as limited MPS shows a fixed inferior wall defect, differential considerations include infarct vs diaphragm attenuation. Cardiology fellow was consulted who reviewed the stress test report and felt that this could be artifact, and if there is any defect it is fixed, so he can be discharged with metoprolol and prn nitroglycerin and follow up in [facility] in 2 weeks.    2. Patient was about to be discharged at 6pm, but he states that he has no ride and he likes to go home in the morning.   Will discharge him in am.   3. Start Metoprolol 25mg  po bid, d/c hctz.  4. LDL is 79. TC: 162. Patient not on statins.   5. Hypokalemia: corrected.     DVT/PE Prophylaxis: Lovenox    Disposition Planning: Home discharge      Darylene Price, MD 07/21/2010, 6:54 PM

## 2010-07-21 NOTE — Nurses Notes (Signed)
FS 147 per Zach [place], no coverage required.

## 2010-07-21 NOTE — Discharge Summary (Signed)
[facility]                                              DISCHARGE SUMMARY      PATIENT NAMENobuo, Nunziata   MRN:  [id number]  DOB:  [birth year]    ADMISSION DATE:  07/20/2010  DISCHARGE DATE: 07/21/2010    ATTENDING PHYSICIAN: Riker Collier, MD  PRIMARY CARE PHYSICIAN: Sander Nephew, DO     ADMISSION DIAGNOSIS: Chest pain  DISCHARGE DIAGNOSIS:   Hospital Problems    1Chest pain         Date Noted: 07/20/2010      HTN (hypertension)         Date Noted: 07/20/2010      Diabetes mellitus         Date Noted: 07/20/2010      Hyperlipidemia         Date Noted: 07/20/2010      Morbid obesity         Date Noted: 07/20/2010      Sleep apnea         Date Noted: 07/20/2010               Abbee Cremeens, MD                                                                                     DISCHARGE MEDICATIONS:  Current Discharge Medication List      START taking these medications       metoprolol (LOPRESSOR) 25 mg Oral Tablet    take 1 Tab by mouth every 12 hours.    Qty: 30 Tab Refills: 2        nitroglycerin (NITROSTAT) 0.4 mg Sublingual Tablet, Sublingual    1 Tab by Sublingual route Every 5 minutes as needed for Chest pain. for 3 doses over 15 minutes    Qty: 30 Tab Refills: 1          CONTINUE these medications which have NOT CHANGED       Liraglutide (VICTOZA) 0.6 mg/0.1 mL (18 mg/3 mL) Subcutaneous Pen Injector    1.2 mg by Subcutaneous route Once a day.        Metformin (GLUCOPHAGE) 1,000 mg Oral Tablet    take 1,000 mg by mouth Twice daily with food.        Olmesartan-Hydrochlorothiazide (BENICAR HCT) 40-12.5 mg Oral Tablet    take 1 Tab by mouth Once a day.        Glipizide (GLUCOTROL) 10 mg Oral Tablet    take 10 mg by mouth Daily before Breakfast. Take 30 minutes before meals         Aspirin 81 mg Oral Tablet    take 81 mg by mouth Once a day.        Omega-3 Fatty Acids-Vitamin E (FISH OIL) 1,000 mg Oral Capsule    take 1,000 mg by mouth Twice daily.           multivitamin Oral Tablet    take 1 Tab by  mouth Once a day.        hydrochlorothiazide (HYDRODIURIL) 25 mg Oral Tablet    take 25 mg by mouth Once a day.                DISCHARGE INSTRUCTIONS:     SCHEDULE FOLLOW-UP - CARDIOLOGY - [place] [facility]   Follow-up in: 2 WEEKS    Reason for visit: HOSPITAL DISCHARGE    Followup reason: Hospital discharge, atypical chest pain with risk factors, Stress test showed fixed inferior defect.     Provider: First Available Physician      SCHEDULE FOLLOW-UP OUTSIDE PHYSICIAN   Follow-up in: 1 WEEK    Followup reason: hospital discharge    Provider: Dr. Tiburcio Pea      DISCHARGE INSTRUCTION - DIET   Diet: LIMIT SALT/SODIUM      DISCHARGE INSTRUCTION - ACTIVITY   Activity: MAY RESUME PREVIOUS ACTIVITY      MISCELLANEOUS INSTRUCTIONS TO PATIENT   Please check Blood pressure and if BP is running low, hold Hydrochlorthiazide.   Any severe chest pain unresolved by nitroglycerin 3 pills every 5 min, call 911 and go to ER.       REASON FOR HOSPITALIZATION AND HOSPITAL COURSE:  This is a 49 y.o., male admitted with atypical chest pain. EKG was unremarkable, admitted to Observation unit and 3 sets of cardiac enzymes obtained and negative. Stress test was done and report was read as: 1. Limited MPS shows a fixed inferior wall defect. Differential considerations include infarct versus diaphragm attenuation. Correlate with other noninvasive cardiac studies.     Cardiology fellow was consulted who reviewed the stress test report and felt that this could be artifact, and if there is any defect it is fixed, so he can be discharged with metoprolol and prn nitroglycerin and follow up in [facility] heart institute in 2 weeks.   When the patient was stable he was discharged home and advised him to follow up with PCP and cardiology.     CONDITION ON DISCHARGE:  A. Ambulation: independent  B. Self-care Ability: full  C. Cognitive Status alert, awake and oriented.     DISCHARGE DISPOSITION:  Home discharge      cc: Primary Care Physician:  Sander Nephew  [address]  [place] [address]     UE:[id number] Physician:  Referral Self  No address on file.       Darylene Price, MD

## 2010-07-21 NOTE — Care Management Notes (Addendum)
Care Coordinator/Social Work Plan    Patient Name: [name]  DOB: 28-Jan-1961  Age: 49  Account Number: [id number]  MR Number: [id number]    Assessment Information    100. Adult Admission Assessment  Created By Lorenda Ishihara Date/Time 2010-07-21 15:27:18.210    Patient Assessment & Education    CCC/MSW met with pt/family/other and provided education on    On role of the CM Dept in their hospital stay  On role of the Clinical Care Coordinator  Communications:    Primary Language:    English    In what languge does the patient/caregiver want the educational material documen  ted:    English    Does the patient/caregiver need a Translator:    No  Living Arrangments    Prior to admission housing arrangements:    With Spouse    Steps:    Number of steps to enter 6  Number of steps inside 0  Support System:    Agencies the patient was using prior to admission:    None    Comments:    Notes:  Pt. has a Bipap at home.  Agencies, Pharmacy, Dialysis and PCP Active Prior to Admission:    Does the patient have prescription coverage?    Yes- Commercial     Patient's Pharmacy    Name & Phone Number [facility]    Primary Care Physician    Practice/MD Name & Phone Number Dr. Thornton Park-- [facility]  Case Discussed with:    Case Discussed with Patient    Yes    Case Discussed with Medical Team (MD, RN, OT, NP, PA)    Yes, Name and profession Dr. Brayton Mars    Was the Patient and/or caregiver(s) able to verbalize understanding of the disch  arge plan?    Yes D/C home    Additional Information    Notes:  49 y/o male admitted for chest pain/nausea. Pt. has no h/o of HHA or DME Utiliz  ation other than for bipap. Pt. can arrange transportation home at d/c. Antici  pated d/c to home when medically stable.    140. Medical Necessity and Level of Care  Created By Blaine Hamper Date/Time 2010-07-22 08:59:23.020    Medical Necessity and Level Of Care Notification    Level of Care     Met with pt/family/other and provided education on OBS patient status    Level of Care Notification    Patient notified of Level of Care status change to Observation (Date / Time)    07/22/2010 9am

## 2010-07-21 NOTE — Nurses Notes (Signed)
Pt received from ED. Report received from ED RN. Pt is alert and oriented. Lungs are clear 96% on RA. Pt has complaint of mild back pain, but states that it is much better than when he first came to ER. Admission completed. See flowsheet for full assessment.

## 2010-07-21 NOTE — Nurses Notes (Signed)
Pt NPO for stress test. PO potassium due at 0700. MD paged. MD states pt may take medication with sips of water. Will carry out orders as requested.

## 2010-07-21 NOTE — Care Management Notes (Signed)
Utilization Review Determination    SECTION I  Reason for Physician Advisor Referral: Does not meet screening criteria for admission. 07/20/10 (date)    Current MERLIN MD Order: inpt  Current MERLIN ADT Order: inpt  Utilization Review Findings: obs  Additional information available for medical team: Yes    Utilization Review MD: Asencion Noble  Based on clinical information available to me as per above and in chart, the patient's status should be: Observation    Utilization Review RN: Sullivan Lone, RN                               Date/Time: 07/22/10 17:50  -------------------------------------------------------------------------------------------------------------------  1.  I have reviewed this case with the patient's treating physician [facility], and the MD agrees with this change in status.  They are aware of the referral to the Utilization Review Committee.    4.  MERLIN order changed: Yes       OBS Database: Yes       UDR Doc in Allscripts: Yes    5.  The patient has been notified on na (time) on na (day) of any changes in level of care. Discharged prior to review.        Patient Notification Doc in Allscripts:Yes    Utilization Review RN: Sullivan Lone, RN                                  Date/Time: 07/21/10 17:51  (Patient notification is required only if the status is changed while an Inpatient to Observation Status)  -------------------------------------------------------------------------------------------------------------------

## 2010-07-22 LAB — BASIC METABOLIC PANEL
ANION GAP: 6 mmol/L (ref 5–16)
BUN/CREAT RATIO: 11 (ref 6–22)
BUN: 10 mg/dL (ref 8–26)
CALCIUM: 8.2 mg/dL — ABNORMAL LOW (ref 8.5–10.4)
CARBON DIOXIDE: 26 mmol/L (ref 23–33)
CHLORIDE: 104 mmol/L (ref 96–111)
CREATININE: 0.95 mg/dL (ref 0.62–1.27)
ESTIMATED GLOMERULAR FILTRATION RATE: >59 ml/min/1.73m2 (ref >59)
GLUCOSE,NONFAST: 168 mg/dL — ABNORMAL HIGH (ref 65–139)
POTASSIUM: 3.8 mmol/L (ref 3.5–5.1)
SODIUM: 136 mmol/L (ref 136–145)

## 2010-07-22 LAB — CBC/DIFF
BASOPHILS: 0 % (ref 0–1)
BASOS ABS: 0.025 THOU/uL (ref 0.0–0.2)
EOS ABS: 0.246 THOU/uL (ref 0.1–0.3)
EOSINOPHIL: 3 % (ref 1–6)
HCT: 42.5 % (ref 39.8–50.2)
HGB: 13.9 g/dL (ref 13.1–17.3)
LYMPHOCYTES: 26 % (ref 20–45)
LYMPHS ABS: 2.16 THOU/uL (ref 1.0–4.8)
MCH: 29.5 pg (ref 27.4–33.0)
MCHC: 32.6 g/dL (ref 31.6–35.5)
MCV: 90.5 fL (ref 82.0–99.0)
MONOCYTES: 7 % (ref 4–13)
MONOS ABS: 0.542 THOU/uL (ref 0.1–0.9)
MPV: 6.2 FL — ABNORMAL LOW (ref 7.4–10.4)
NRBC'S: 0 /100{WBCs}
PLATELET COUNT: 259 THOU/uL (ref 140–450)
PMN ABS: 5.33 THOU/uL (ref 1.5–7.7)
PMN'S: 64 % (ref 40–75)
RBC: 4.7 MIL/uL (ref 4.46–5.70)
RDW: 13.1 % (ref 10.2–14.0)
WBC: 8.3 THOU/uL (ref 3.5–11.0)

## 2010-07-22 LAB — MAGNESIUM: MAGNESIUM: 2.2 mg/dL (ref 1.7–2.5)

## 2010-07-22 LAB — PHOSPHORUS: PHOSPHORUS: 3.8 mg/dL (ref 2.4–4.7)

## 2010-07-22 LAB — PERFORM POC WHOLE BLOOD GLUCOSE: GLUCOSE, POINT OF CARE: 233 mg/dL — ABNORMAL HIGH (ref 70–105)

## 2010-07-22 NOTE — Care Management Notes (Signed)
Patient did not discharge last night as planned OBS letter given this am

## 2010-07-22 NOTE — Respiratory Therapy (Signed)
Pt wore CPAP all night with no issues, which is ordered for sleep apnea.

## 2010-07-22 NOTE — Consults (Addendum)
[facility]   Cardiology Consult  Initial Consultation      [name], 49 y.o. male  Date of service: 07/22/2010  Date of Birth:  March 06, 1961  Requestion Faculty:Dr. Panugoti  Information Obtained from: patient, health care provider and history reviewed via medical record  Chief Complaint: Chest pain    Impression: 49 yr old male with no known CAD admitted with -   1. Atypical chest pain.  2. HTN  3. DM  4. Hyperlipidemia.  5. Sleep apnea.  6. Obesity.    Recommendations:  1. Further testing invasive/ non-invasive needed in view of a rather inconclusive stress MPS. Patient is more inclined to follow up as an outpatient in 1-2 weeks time and then decide on further management strategy. Does not wish to undergo invasive evaluation with a cardiac cath at this time.  2. Agree with aspirin, betablocker and prn nitro. Statin for a goal LDL of < 100.   3. Good blood sugar control. Low cholesterol/ fat diet and exercise for weight reduction.  4. Report immediately if recurrence of symptoms.    Follow up at [facility] in 1-2 weeks time.    The patient verbalized understanding and agrees with the above plan of care.    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  Describes the pain as a pressure like feeling as if someone was pushing against his chest wall. Notes a generalized tingling feeling, but no focal weakness or numbness. No jaw pain or arm pain. Intermittent back pain. No similar previous episodes. Some associated nausea, but no vomiting or change in bowel habits. Denies abdominal pain. No SOB. The pain resolved with nitroglycerin SL x 2. No previous cardiac disease. No exertional chest discomfort in the past.     Cardiac risk factors : morbid obesity, diabetes, HTN, and hyperlipidemia, sleep apnea. No family history of CAD and tobacco use.     Stress test a "few years back", cannot recall when, but apparently normal.     No cough, fever, chills or diaphoresis.       Past Medical History   Diagnosis Date   . HTN    . Diabetes    . Sleep apnea    . Hyperlipidemia    . Morbid obesity        Past Surgical History   Procedure Date   . Hx hernia repair      inguinal and umbilical repair    . Hx wisdom teeth extraction    . Hx tonsillectomy        Prior to Admission Medications:  Prescriptions prior to admission   Medication Sig Dispense Refill   . Liraglutide (VICTOZA) 0.6 mg/0.1 mL (18 mg/3 mL) Subcutaneous Pen Injector 1.2 mg by Subcutaneous route Once a day.       . Metformin (GLUCOPHAGE) 1,000 mg Oral Tablet take 1,000 mg by mouth Twice daily with food.       . Olmesartan-Hydrochlorothiazide (BENICAR HCT) 40-12.5 mg Oral Tablet take 1 Tab by mouth Once a day.       . Glipizide (GLUCOTROL) 10 mg Oral Tablet take 10 mg by mouth Daily before Breakfast. Take 30 minutes before meals        . Aspirin 81 mg Oral Tablet take 81 mg by mouth Once  a day.       . Omega-3 Fatty Acids-Vitamin E (FISH OIL) 1,000 mg Oral Capsule take 1,000 mg by mouth Twice daily.       . multivitamin Oral Tablet take 1 Tab by mouth Once a day.       . hydrochlorothiazide (HYDRODIURIL) 25 mg Oral Tablet take 25 mg by mouth Once a day.           Inpatient Medications:    Current facility-administered medications:  metoprolol (LOPRESSOR) tablet  25 mg Oral Q12H   multivitamin tablet  1 Tab Oral Daily   olmesartan (BENICAR) tablet  40 mg Oral Daily   Omega-3 Fatty Acids-Vitamin E 1,000 mg Cap 1,000 mg 1,000 mg Oral 2x/day   enoxaparin (LOVENOX) 40 mg/0.4 mL SubQ injection  40 mg Subcutaneous Q24H   docusate sodium (COLACE) capsule  100 mg Oral 2x/day PRN   polyethylene glycol (MIRALAX) oral packet  17 g Oral Daily PRN    sennosides-docusate sodium (SENOKOT-S) 8.6-50mg  per tablet  1 Tab Oral 2x/day PRN   ondansetron (ZOFRAN) 2 mg/mL injection  4 mg Intravenous Q6H PRN   NS 10 mL injection  1 mL Intracatheter Q8HRS   NS 10 mL injection  0.5 mL Intracatheter Q1 MIN PRN   HYDROmorphone (DILAUDID) 1 mg/mL injection  0.2 mg Intravenous Q4H PRN   nitroglycerin (NITROSTAT) sublingual tablet  0.4 mg Sublingual Q5 Min PRN   SSIP insulin lispro human (HUMALOG) 100 units/mL injection  2-6 Units Subcutaneous 4x/day PRN   aspirin chewable tablet 81 mg 81 mg Oral Daily       Allergies   Allergen Reactions   . Tylox (Oxycodone-acetaminophen)        Dye Allergy:  No  Iodine Allergy:  No    ROS: Other than ROS in the HPI, all other systems were negative.    Exam:  Temperature: 36.8 C (98.2 F)  Heart Rate: 89   BP (Non-Invasive): 140/75 mmHg  Respiratory Rate: 19   SpO2-1: 97 %  Pain Score: 0  General: no distress  Eyes: Conjunctiva clear., Pupils equal and round.   HENT:Head atraumatic and normocephalic  Neck: No JVD  Lungs: Clear to auscultation bilaterally.   Cardiovascular: regular rate and rhythm  S1, S2 normal  no rub     no murmur.  Abdomen: Soft, non-tender, Bowel sounds normal  Extremities: trace edema.  Skin: Skin warm and dry  Neurologic: Grossly normal      Labs:  BMP:     Lab Results   Component Value Date    SODIUM 136 07/22/2010    POTASSIUM 3.8 07/22/2010    CHLORIDE 104 07/22/2010    CO2 26 07/22/2010    BUN 10 07/22/2010    CREATININE 0.95 07/22/2010    GLUCOSENF 168* 07/22/2010    ANIONGAP 6 07/22/2010    BUNCRRATIO 11 07/22/2010    GFR >59 07/22/2010    CALCIUM 8.2* 07/22/2010       CBC with Differential:     Lab Results   Component Value Date    WBC 8.3 07/22/2010    HGB 13.9 07/22/2010    HCT 42.5 07/22/2010    PLTCNT 259 07/22/2010    RBC 4.70 07/22/2010    MCV 90.5 07/22/2010    MCHC 32.6 07/22/2010    MCH 29.5 07/22/2010    RDW 13.1 07/22/2010    MPV 6.2* 07/22/2010    PMNS 64 07/22/2010    LYMPHOCYTES 26 07/22/2010    EOSINOPHIL  3 07/22/2010     MONOCYTES 7 07/22/2010    BASOPHILS 0 07/22/2010       Magnesium:     Lab Results   Component Value Date    MAGNESIUM 2.2 07/22/2010       Phosphorus:     Lab Results   Component Value Date    PHOSPHORUS 3.8 07/22/2010       INR:     Lab Results   Component Value Date    INR 1.0 07/20/2010       PTT:     Lab Results   Component Value Date    APTT 21.3* 07/20/2010       Last 3 Cardiac Markers:  No results found for this encounter  Lipid Panel:    Lab Results   Component Value Date    CHOLESTEROL 162 07/21/2010    HDLCHOL 28* 07/21/2010    LDLCHOL 79 07/21/2010    TRIG 274* 07/21/2010    VLDLCAL 55* 07/21/2010       TSH:    No results found for this basename: tsh       Imaging Studies:    Previous echo results:  None.  Previous ECG results:  NSR  Previous cath results:  None.  Previous stess results on 07/21/2010:  IMPRESSION:   1. Limited MPS shows a fixed inferior wall defect. Differential considerations include infarct versus diaphragm attenuation. Correlate with other noninvasive cardiac studies.     Delane Ginger, MD 07/22/2010, 9:34 AM    Cardiology Consult Staff    Please see full note by resident/fellow. I discussed this morning with Dr. Raphael Gibney, and suggested PET-CT or cath. Patient was discharged before I saw him; we made afternoon rounds near 2 p.m.      Gara Kroner, MD  Beeper: [phone]  Cell phone: [phone]

## 2010-07-22 NOTE — Nurses Notes (Signed)
Patient given discharge instructions per AVS. Written copy and prescriptions given. IV access removed. Patient to be discharged to home, ambulatory. Cab called to take patient to his truck.

## 2010-07-22 NOTE — Nurses Notes (Signed)
Pt has no C/O N/V, SOB or Chest pain .  Vital signs are stable. Afebrile.  O2 sats are 96-98 on RA.  Complete assessment as per doc flowsheet.  Will continue to monitor

## 2010-08-04 ENCOUNTER — Encounter (HOSPITAL_BASED_OUTPATIENT_CLINIC_OR_DEPARTMENT_OTHER): Payer: 59 | Admitting: INTERVENTIONAL CARDIOLOGY

## 2011-08-22 DIAGNOSIS — L0390 Cellulitis, unspecified: Secondary | ICD-10-CM

## 2011-08-22 HISTORY — DX: Cellulitis, unspecified: L03.90

## 2011-09-03 ENCOUNTER — Observation Stay
Admission: EM | Admit: 2011-09-03 | Discharge: 2011-09-06 | Disposition: A | Discharge status: Home / Self Care | Payer: 59 | Attending: Urology | Admitting: Urology

## 2011-09-03 ENCOUNTER — Encounter (HOSPITAL_COMMUNITY): Payer: Self-pay

## 2011-09-03 ENCOUNTER — Observation Stay (HOSPITAL_BASED_OUTPATIENT_CLINIC_OR_DEPARTMENT_OTHER): Payer: 59 | Admitting: Urology

## 2011-09-03 DIAGNOSIS — E119 Type 2 diabetes mellitus without complications: Secondary | ICD-10-CM | POA: Insufficient documentation

## 2011-09-03 DIAGNOSIS — L03319 Cellulitis of trunk, unspecified: Principal | ICD-10-CM | POA: Insufficient documentation

## 2011-09-03 LAB — BASIC METABOLIC PANEL
BUN/CREAT RATIO: 12 (ref 6–22)
BUN: 9 mg/dL (ref 8–26)
CALCIUM: 8.5 mg/dL (ref 8.5–10.4)
CARBON DIOXIDE: 24 mmol/L (ref 23–33)
CHLORIDE: 105 mmol/L (ref 96–111)
CREATININE: 0.72 mg/dL (ref 0.62–1.27)
ESTIMATED GLOMERULAR FILTRATION RATE: >59 ml/min/1.73m2 (ref >59)
GLUCOSE,NONFAST: 192 mg/dL — ABNORMAL HIGH (ref 65–139)
POTASSIUM: 3.4 mmol/L — ABNORMAL LOW (ref 3.5–5.1)
SODIUM: 139 mmol/L (ref 136–145)

## 2011-09-03 LAB — CBC/DIFF
BASOPHILS: 0 % (ref 0–1)
BASOS ABS: 0.01 10*3/uL (ref 0.0–0.2)
EOS ABS: 0.32 10*3/uL — ABNORMAL HIGH (ref 0.1–0.3)
HCT: 42.1 % (ref 39.8–50.2)
HGB: 14.4 g/dL (ref 13.1–17.3)
LYMPHOCYTES: 22 % (ref 20–45)
LYMPHS ABS: 2.17 10*3/uL (ref 1.0–4.8)
MCH: 29.9 pg (ref 27.4–33.0)
MCHC: 34.1 g/dL (ref 31.6–35.5)
MCV: 87.9 fL (ref 82.0–99.0)
MONOS ABS: 0.637 10*3/uL (ref 0.1–0.9)
MPV: 6.3 FL — ABNORMAL LOW (ref 7.4–10.4)
NRBC'S: 0 /100WBC
PLATELET COUNT: 357 10*3/uL (ref 140–450)
PMN'S: 68 % (ref 40–75)
RBC: 4.8 MIL/uL (ref 4.46–5.70)
RDW: 12.5 % (ref 10.2–14.0)
WBC: 9.8 THOU/uL (ref 3.5–11.0)

## 2011-09-03 LAB — C-REACTIVE PROTEIN(CRP),INFLAMMATION: C-REACTIVE PROTEIN (CRP),INFLAMMATION: 3.632 mg/dL — ABNORMAL HIGH (ref <0.800)

## 2011-09-03 MED ORDER — DIPHTH,PERTUS(ACEL)TETANUS(PF)2LF-(2.5-5-3-5MCG)-5 LF/0.5 ML IM SUSP
0.50 mL | INTRAMUSCULAR | Status: AC
Start: 2011-09-04 — End: 2011-09-04
  Administered 2011-09-04: 0.5 mL via INTRAMUSCULAR
  Filled 2011-09-03: qty 0.5

## 2011-09-03 MED ORDER — ONDANSETRON HCL (PF) 4 MG/2 ML INJECTION SOLUTION
INTRAMUSCULAR | Status: AC
Start: 2011-09-03 — End: 2011-09-05
  Filled 2011-09-03: qty 2

## 2011-09-03 MED ORDER — ONDANSETRON HCL (PF) 4 MG/2 ML INJECTION SOLUTION
4.00 mg | INTRAMUSCULAR | Status: AC
Start: 2011-09-04 — End: 2011-09-03
  Administered 2011-09-03: 4 mg via INTRAVENOUS

## 2011-09-03 NOTE — ED Nurses Note (Signed)
Pt reports increased nausea.  ED Resident notified, orders obtained for 4mg  zofran, pt medicated see MAR.  Will continue to monitor.

## 2011-09-03 NOTE — ED Attending Note (Signed)
Note begun by: Ezzie Dural, MD 09/03/2011, 11:33 PM    I was physically present and directly supervised this patient's care.  Patient was seen and examined.  The resident's history and exam were reviewed.  Key elements in addition to and/or correction of that documentation are as follows:  Vitals  Blood pressure 168/98, pulse 101, temperature 36.9 C (98.5 F), resp. rate 18, weight 162.388 kg (358 lb), SpO2 96.00%..  This is a 51 y.o. male.  He is here complaining of testicle pain and swelling and redness. This is been progressive for the last 48 hours. 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 He was seen at an outside facility where ultrasound showed no torsion however they were concerned for possible Fournier's gangrene. They transferred him for further evaluation. His scrotum is swollen red tender and fluctuant. No crepitus.         Old records were reviewed.          Disposition: Admitted    Clinical Impression:     Encounter Diagnosis   Name Primary?   . Cellulitis, scrotum        Critical Care:  None

## 2011-09-03 NOTE — ED Provider Notes (Signed)
HPI  Pt sent from [facility] with concern for testicular swelling and redness, at outside facility concern for Fournier's gangrene. Had outside [place] that ruled out torsion. Given Primaxin and Vanc and transferred here. Outside labs were unremarkable and WBC was 10.2. The pt states that his testicle redness and swelling began two days prior.  Pt is about two weeks out from treatment of bronchitis with amoxicillin. He denies any fevers, chills, nausea, vomiting, abdominal pain.      Review of Systems  Constitutional: No fever, chills or weakness   Skin: No rash or diaphoresis  HENT: No headaches or congestion  Eyes: No vision changes   Cardio: No chest pain, palpitations or leg swelling   Respiratory: No cough, wheezing or SOB  GI:  No nausea, vomiting or stool changes  GU:  No urinary changes. + scrotal redness and swelling  MSK: No joint or back pain  Neuro: No seizures or LOC  Psychiatric: No depression, SI or substance abuse  All other systems reviewed and are negative.        History:   Past Medical History:  Past Medical History   Diagnosis Date   . HTN    . Diabetes    . Sleep apnea    . Morbid obesity        Past Surgical History:  Past Surgical History   Procedure Date   . Hx hernia repair    . Hx hernia repair    . Hx orchiopexy        Social History:  History   Substance Use Topics   . Smoking status: Never Smoker    . Smokeless tobacco: Not on file   . Alcohol Use: No     History   Drug Use        Family History:  Reports no significant family history    The patient past medical, surgical, family, and social history were reviewed with the patient. No discrepancies from what is noted above.      Physical Exam  Nursing notes reviewed.    ED Triage Vitals   Enc Vitals Group      BP (Non-Invasive) 09/03/11 2231 168/98 mmHg      Heart Rate 09/03/11 2231 101       Respiratory Rate 09/03/11 2231 18       Temperature 09/03/11 2231 36.9 C (98.5 F)      Temp src --       SpO2-1 09/03/11 2231 96 %       Weight 09/03/11 2231 162.388 kg (358 lb)     Constitutional: NAD. Oriented  HENT:   Head: Normocephalic and atraumatic.   Mouth/Throat: Oropharynx is clear and moist.   Eyes: EOMI, PERRL   Neck: Trachea midline. Neck supple.  Cardiovascular: RRR, No murmurs, rubs or gallops. Intact distal pulses.  Pulmonary/Chest: BS equal bilaterally. No respiratory distress. No wheezes, rales or chest tenderness.   Abdominal: BS +. Abdomen soft, no tenderness, rebound or guarding.    GU: Scrotum redness, swelling, and fluctuance. No crepitus. Redness extends to bilateral inguinal areas and perineum.  Musculoskeletal: No edema, tenderness or deformity.  Skin: warm and dry. No rash, erythema, pallor or cyanosis  Psychiatric: normal mood and affect. Behavior is normal.   Neurological: Alert. Grossly intact.      Course  Concern for Fournier's vs cellulitis of the scrotum.  Repeat basic labs.  Outside images loaded onto image grid.  Orders Placed This Encounter   . ADULT  ROUTINE BLOOD CULTURE, SET OF 2 BOTTLES (BACTERIA AND YEAST)   . ADULT ROUTINE BLOOD CULTURE, SET OF 2 BOTTLES (BACTERIA AND YEAST)   . URINE CULTURE,ROUTINE   . CBC/DIFF   . BASIC METABOLIC PANEL, NON-FASTING   . URINALYSIS W/CULTURE (INPT ROUTINE)   . C-REACTIVE PROTEIN(CRP),INFLAMMATION   . SEDIMENTATION RATE   . BLOOD/SPECIMEN IN LAB   . PT/INR   . PTT (PARTIAL THROMBOPLASTIN TIME)   . ondansetron (ZOFRAN) 2 mg/mL injection   . diphtheria,pertussis-acel,tetanus (ADACEL) vaccine     Urology was consulted. After their evaluation Dr. Althea Charon believed the pt had cellulitis of the groin. He desired to admit the pt for observation and continue iv abx. He did not want a CT of the pt at this time, and would order it as needed upstairs.    The pt was admitted without further difficulty.    Kennon Holter, MD 09/04/2011, 1:04 AM

## 2011-09-03 NOTE — ED Nurses Note (Signed)
Pt to room 7 from [facility], with increased swelling with rash to testicles and genital area.  Upon arrival to ED pt is alert + oriented, able to move all extremities and denies any further complaints.  Labs drawn from 20g IV to right hand placed PTA.

## 2011-09-04 ENCOUNTER — Encounter (HOSPITAL_COMMUNITY): Payer: Self-pay | Admitting: Urology

## 2011-09-04 LAB — PT/INR
INR: 1 (ref 0.8–1.2)
PROTHROMBIN TIME: 10.7 s (ref 9.1–11.5)

## 2011-09-04 LAB — CLOSTRIDIUM DIFFICILE TOXIN DETECTION

## 2011-09-04 LAB — SEDIMENTATION RATE: SEDIMENTATION RATE: 28 mm/hr — ABNORMAL HIGH (ref 0–15)

## 2011-09-04 LAB — PERFORM POC WHOLE BLOOD GLUCOSE
GLUCOSE, POINT OF CARE: 284 mg/dL — ABNORMAL HIGH (ref 70–105)
GLUCOSE, POINT OF CARE: 258 mg/dL — ABNORMAL HIGH (ref 70–105)
GLUCOSE, POINT OF CARE: 240 mg/dL — ABNORMAL HIGH (ref 70–105)

## 2011-09-04 MED ORDER — ONDANSETRON HCL (PF) 4 MG/2 ML INJECTION SOLUTION
4.00 mg | Freq: Four times a day (QID) | INTRAMUSCULAR | Status: DC | PRN
Start: 2011-09-04 — End: 2011-09-06

## 2011-09-04 MED ORDER — VANCOMYCIN 10 GRAM INTRAVENOUS SOLUTION
15.00 mg/kg | Freq: Two times a day (BID) | INTRAVENOUS | Status: AC
Start: 2011-09-04 — End: 2011-09-04
  Administered 2011-09-04 (×2): 2500 mg via INTRAVENOUS
  Administered 2011-09-04: 0 mg via INTRAVENOUS
  Filled 2011-09-04 (×2): qty 25

## 2011-09-04 MED ORDER — SODIUM CHLORIDE 0.9 % (FLUSH) INJECTION SYRINGE
2.00 mL | INJECTION | Freq: Three times a day (TID) | INTRAMUSCULAR | Status: DC
Start: 2011-09-04 — End: 2011-09-06
  Administered 2011-09-04 – 2011-09-05 (×5): 2 mL
  Administered 2011-09-05: 0 mL
  Administered 2011-09-05: 2 mL
  Administered 2011-09-06: 0 mL

## 2011-09-04 MED ORDER — INSULIN LISPRO (U-100) 100 UNIT/ML SUBCUTANEOUS SOLUTION
8.0000 [IU] | Freq: Three times a day (TID) | SUBCUTANEOUS | Status: DC
Start: 2011-09-04 — End: 2011-09-06
  Administered 2011-09-04 – 2011-09-06 (×5): 8 [IU] via SUBCUTANEOUS
  Filled 2011-09-04: qty 3

## 2011-09-04 MED ORDER — INSULIN GLARGINE (U-100) 100 UNIT/ML SUBCUTANEOUS SOLUTION
25.0000 [IU] | SUBCUTANEOUS | Status: AC
Start: 2011-09-04 — End: 2011-09-04
  Administered 2011-09-04: 25 [IU] via SUBCUTANEOUS
  Filled 2011-09-04: qty 25

## 2011-09-04 MED ORDER — DIPHENHYDRAMINE 25 MG CAPSULE
25.00 mg | ORAL_CAPSULE | Freq: Every evening | ORAL | Status: DC | PRN
Start: 2011-09-04 — End: 2011-09-06
  Administered 2011-09-06: 25 mg via ORAL
  Filled 2011-09-04: qty 1

## 2011-09-04 MED ORDER — SODIUM CHLORIDE 0.9 % (FLUSH) INJECTION SYRINGE
2.00 mL | INJECTION | INTRAMUSCULAR | Status: DC | PRN
Start: 2011-09-04 — End: 2011-09-06

## 2011-09-04 MED ORDER — POTASSIUM CHLORIDE ER 20 MEQ TABLET,EXTENDED RELEASE(PART/CRYST)
40.0000 meq | ORAL_TABLET | Freq: Once | ORAL | Status: AC
Start: 2011-09-04 — End: 2011-09-04
  Administered 2011-09-04: 40 meq via ORAL
  Filled 2011-09-04: qty 2

## 2011-09-04 MED ORDER — PIPERACILLIN-TAZOBACTAM 40.5 GRAM INTRAVENOUS SOLUTION
3.38 g | Freq: Three times a day (TID) | INTRAVENOUS | Status: DC
Start: 2011-09-04 — End: 2011-09-05
  Administered 2011-09-04 (×3): 3.375 g via INTRAVENOUS
  Administered 2011-09-04: 0 g via INTRAVENOUS
  Administered 2011-09-05: 3.375 g via INTRAVENOUS
  Filled 2011-09-04 (×6): qty 15

## 2011-09-04 MED ORDER — INSULIN REGULAR HUMAN 100 UNIT/ML INJECTION SSIP
2.00 [IU] | INJECTION | Freq: Four times a day (QID) | SUBCUTANEOUS | Status: DC | PRN
Start: 2011-09-04 — End: 2011-09-04
  Administered 2011-09-04 (×2): 6 [IU] via SUBCUTANEOUS
  Filled 2011-09-04: qty 3

## 2011-09-04 MED ORDER — ENOXAPARIN 40 MG/0.4 ML SUBCUTANEOUS SYRINGE
40.00 mg | INJECTION | SUBCUTANEOUS | Status: DC
Start: 2011-09-04 — End: 2011-09-05
  Administered 2011-09-04 – 2011-09-05 (×2): 40 mg via SUBCUTANEOUS
  Filled 2011-09-04 (×3): qty 0.4

## 2011-09-04 MED ORDER — INSULIN LISPRO 100 UNIT/ML SUB-Q SSIP
3.00 [IU] | INJECTION | SUBCUTANEOUS | Status: DC | PRN
Start: 2011-09-04 — End: 2011-09-06
  Administered 2011-09-04 (×2): 6 [IU] via SUBCUTANEOUS
  Administered 2011-09-05 (×2): 3 [IU] via SUBCUTANEOUS
  Administered 2011-09-05: 9 [IU] via SUBCUTANEOUS
  Administered 2011-09-05 – 2011-09-06 (×2): 3 [IU] via SUBCUTANEOUS

## 2011-09-04 MED ORDER — OLMESARTAN 20 MG TABLET
20.0000 mg | ORAL_TABLET | Freq: Every day | ORAL | Status: DC
Start: 2011-09-04 — End: 2011-09-06
  Administered 2011-09-04 – 2011-09-06 (×3): 20 mg via ORAL
  Filled 2011-09-04 (×3): qty 1

## 2011-09-04 MED ORDER — METOPROLOL TARTRATE 25 MG TABLET
25.00 mg | ORAL_TABLET | Freq: Two times a day (BID) | ORAL | Status: DC
Start: 2011-09-04 — End: 2011-09-06
  Administered 2011-09-04 – 2011-09-06 (×5): 25 mg via ORAL
  Filled 2011-09-04 (×7): qty 1

## 2011-09-04 MED ORDER — PNEUMOCOCCAL 23 POLYVALENT VACCINE 25 MCG/0.5 ML INJECTION SOLUTION
0.5000 mL | Freq: Once | INTRAMUSCULAR | Status: AC
Start: 2011-09-04 — End: 2011-09-04
  Administered 2011-09-04: 0.5 mL via INTRAMUSCULAR
  Filled 2011-09-04: qty 0.5

## 2011-09-04 MED ORDER — INSULIN GLARGINE (U-100) 100 UNIT/ML SUBCUTANEOUS SOLUTION
25.0000 [IU] | Freq: Every evening | SUBCUTANEOUS | Status: DC
Start: 2011-09-05 — End: 2011-09-06
  Administered 2011-09-05: 25 [IU] via SUBCUTANEOUS
  Filled 2011-09-04: qty 25

## 2011-09-04 NOTE — Consults (Addendum)
[facility]  Endocrinology Initial Diabetes Consult      Admission Date: 09/03/2011  Name: [name]  Age: 51 y.o.  Attending: Al-Omar, Samuel Germany, MD  PCP Nehemiah Settle, MD    Assessment  1. Diabetes Mellitus Type II    Recommendations:   1. Start Lispro 8units TID with meals.  2. Start Lantus 25units qAM starting tomorrow and please give a one time dose of Lantus 25units now.  3. Start a conservative Lispro SSI.  4. Please check a HgbA1C.  5. Start a 2000 calorie ADA diet.  6. Have Diabetic Educator see patient.      History of Present Illness    [name] is a 51 y.o. year old male with Diabetes Mellitus Type II for 3 years.     He has no/following complications from DM:  Complications Yes/No   Retinopathy No   Stroke No   Coronary Artery Disease No   Hypertension No   Hyperlipidemia No   Gastroparesis No   Chronic Kidney Disease No   Microalbuminuria No   Peripheral Artery Disease No   Peripheral Vascular Disease No   Autonomic Neuropathy No   Peripheral Neuropathy No     Current Diabetes Medications  Metformin (Glucophage), Glipizide and Liraglutide (Victoza)    Blood Sugar monitoring/Home testing frequency: Pre and post-meals    Recent Labs   Basename 09/04/11 1112 09/04/11 0119    GLUCOSEPOC 258* 284*          Hypoglycemia: Not occurring    Last Eye exam: unknown  Last Foot exam: unknown  No results found for this basename: HA1C,GLUCOSEFAST,CHOLESTEROL,HDLCHOL,LDLCHOL,TRIG,MICALBRNUR,MICALBCRERAT in the last 16109 hours    Other recent hospitalizations for diabetes related issue: No    Patient Active Problem List   Diagnoses   . Cellulitis, scrotum   . Sleep apnea     Past Medical History  Past Medical History   Diagnosis Date   . HTN    . Diabetes    . Sleep apnea    . Morbid obesity     Past Surgical History   Procedure Date   . Hx hernia repair      Bilateral   . Hx hernia repair    . Hx orchiopexy         Current Facility-Administered Medications   Medication    . enoxaparin (LOVENOX) 40 mg/0.4 mL SubQ injection   . vancomycin (VANCOCIN) 2,500 mg in NS 500 mL IVPB   . piperacillin-tazobactam (ZOSYN) 3.375 g in D5W 50 mL IVPB   . SSIP insulin R human (HUMULIN R) 100 units/mL injection   . ondansetron (ZOFRAN) 2 mg/mL injection   . diphenhydrAMINE (BENADRYL) capsule   . metoprolol tartrate (LOPRESSOR) tablet   . NS flush syringe    And   . NS flush syringe   . olmesartan (BENICAR) tablet   . potassium chloride (K-DUR) extended release tablet   . ondansetron (ZOFRAN) 2 mg/mL injection   . diphtheria,pertussis-acel,tetanus (ADACEL) vaccine     Allergies   Allergen Reactions   . Tylox (Oxycodone-Acetaminophen)        Family History  No family hx of DM    Social History  History   Substance Use Topics   . Smoking status: Never Smoker    . Smokeless tobacco: Not on file   . Alcohol Use: No      Review of Systems  Non-Endocrine ROS: fever, headache, visual changes, chest pain, shortness of breath, nausea/vomiting/diarrhea, bruising,  rash, burning/numbness/tingling, weakness  Endocrine Related ROS: negative    Examination:  Vital Signs:  Temp  Avg: 36.9 C (98.4 F)  Min: 36.7 C (98.1 F)  Max: 37 C (98.6 F)    Pulse  Avg: 98.5   Min: 88   Max: 105  BP  Min: 143/76  Max: 173/100   Resp  Avg: 18.3   Min: 18   Max: 20  SpO2  Avg: 96 %  Min: 96 %  Max: 96 %   Pain Score: 0    General: Well appearing obesemale in no distress        Bubba Hales, MD 09/04/2011, 3:14 PM  Karen Chafe, MD  PGY-3  Department of Internal Medicine  [facility]     I have seen this patient and reviewed the chart and examined the patient and agree with the above note. Lindie Spruce, MD 09/05/2011, 8:20 AM

## 2011-09-04 NOTE — Care Management Notes (Signed)
Utilization Review Determination    SECTION I  Reason for Physician Advisor Referral: Does not meet screening criteria for admission. 09/04/11    Current MERLIN MD Order: inpatient  Current MERLIN ADT Order: inpatient  Utilization Review Findings: observation  Additional information available for medical team: No    Utilization Review MD: Seward Meth  Based on clinical information available to me as per above and in chart, the patient's status should be: Observation    Utilization Review RN: J.Jolin Benavides                               Date/Time: 09/04/11  1044  -------------------------------------------------------------------------------------------------------------------  1.  I have reviewed this case with the patient's treating physician Dr.Hubsher, and the MD agrees with this change in status.  They are aware of the referral to the Utilization Review Committee.    Marland Kitchen  MERLIN order changed: Yes              UDR Doc in Allscripts: Yes

## 2011-09-04 NOTE — Care Management Notes (Signed)
[facility]  Care Management Initial Evaluation    Patient Name: [name]  Date of Birth: 02/08/1961  Sex: male  Date/Time of Admission: 09/03/2011 10:29 PM  Room/Bed: 805/A  Payor: Investment banker, operational HEALTHCARE  Plan: [place] [facility]  Product Type: Non Managed Care      [name] is a 51 y.o., male    Height/Weight: 172.7 cm (5\' 8" ) / 156.4 kg (344 lb 12.8 oz)    Subjective/Objective: Discharge plan/needs     LOS: 1 day   Admitting Diagnosis: CELLULITIS OF GROIN  Assessment:    09/04/11 1100   Assessment Detail   Assessment Type Admission   Date of Care Management Update 09/04/11   Date of Next DCP Update 09/05/11   Care Management Plan   Discharge Planning Status initial meeting   Projected Discharge Date 09/05/11   CM will evaluate for rehabilitation potential no   Discharge Needs Assessment   Patient has PCP/Pediatrician?  Yes       Name of Provider Dr. Durel Salts   !! Taking Coumadin at home? No   Equipment Currently Used At Home cpap   Equipment Needed After Discharge none   Discharge Facility/Level Of Care Needs Home (Patient/Family Member/other) (code 1)   Transportation Available family or friend will provide   Referral Information   Admission Type observation   Insurance Verified verified-no change   Care Manager Assigned to Case [employer]   Living Environment   Lives With spouse   Quality Of Family Relationships supportive   Able To Return To Prior Living Arrangements yes   Home Safety   Home Assessment: No Problems Identified   Home Accessibility no concerns   Functional Status Current   Ambulation 0-->independent   Transferring 0-->independent   Toileting 0-->independent   Bathing 0-->independent   Dressing 0-->independent   Eating 0-->independent   Communication 0-->understands/communicates without difficulty   Swallowing (If Score 2 Or More For Any Item, Consult Rehab Services) 0-->swallows foods and liquids without difficulty   Functional Status Prior    Ambulation 0-->independent   Transferring 0-->independent   Toileting 0-->independent   Bathing 0-->independent   Dressing 0-->independent   Eating 0-->independent   Communication 0-->understands/communicates without difficulty   Swallowing 0-->swallows foods and liquids without difficulty   Swelling noted to groin and scrotal area. Cellulitis noted. IVs initiated at outside hospital and they obtained cultures also.   Met with patient at bedside. Lives in [place] with wife. Has support, will have transportation at discharge. Has insurance with RX coverage. Utilizes [facility] for meds in [place]. No HH. DME-[facility]. PCP-Dr. Durel Salts.   Discharge Plan:  Discharge to home when medically stable. No discharge needs at this time.The patient will continue to be evaluated for developing discharge needs.     Case Manager: Hendricks Limes Asmaa Tirpak, RN 09/04/2011, 3:33 PM  Phone: [phone]

## 2011-09-04 NOTE — Care Plan (Signed)
Problem: General Plan of Care(Adult,OB)  Goal: Plan of Care Review(Adult,OB)  The patient and/or their representative will communicate an understanding of their plan of care   patient admitted for cellulitis of the groin area. Patient scrotum elevated per orders, and scrotal support applied by patient. Monitor patient for pain, VS, and Labs. Patient on CPAP at night for sleep apnea.

## 2011-09-04 NOTE — Care Plan (Signed)
Problem: General Plan of Care(Adult,OB)  Goal: Plan of Care Review(Adult,OB)  The patient and/or their representative will communicate an understanding of their plan of care   Outcome: Ongoing (see interventions/notes)  [name] was placed on cpap for sleep apnea. He is to wear it QHS. Settings were adjusted per comfort. [name] was educated on cpap usage and what to do when he is ready to take it off. He has no questions at this time.Will follow and continue with current plan.

## 2011-09-04 NOTE — ED Nurses Note (Signed)
Report given to RN pt to room 805 via carrier with transport.

## 2011-09-04 NOTE — Consults (Addendum)
[facility] - Visited [name] per request MD for DM education. All information discussed documented under Patient Education. See notes. [name] states that he has had DM x 3 years. He states that prior to admission he was controlled by Metformin, Glipizide and Victoza.  He states that he was testing his BG 1 x a day alternating before and after meals.  He states that he is being followed by his PCP.  Discussed current hospital insulin regimen. He voiced concern regarding length of time he will be on insulin.  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 Notified resident of [name] concern. Plan to return in am to continue DM education. He denies having any questions at this time.  Encouraged him to call the DEC when needed. Contact number given.   Given consult folder containing information for type II. (proper sharps disposal, consult form, living with diabetes, diabetes survival skills, glucose self testing tips, hyperglycemia, sick days, hypoglycemia, adult diabetes health screenings and maintenance, blood glucose monitoring, diabetes and heart disease, importance of exercise, healthy breakfast ideas, healthy snacks, and diabetic foot care).

## 2011-09-04 NOTE — Progress Notes (Addendum)
[facility]                                                  UROLOGY  PROGRESS NOTE    [name], [name], 51 y.o. male  Date of Admission:  09/03/2011  Date of Birth:  1960/12/26  Date of Service:  09/04/2011    Post Op Day:   S/P    Chief Complaint: scrotal swelling    Subjective: Doing well this morning.  Pain is improved from yesterday.  Denies any fevers or chills.     Vital Signs:  Temp (24hrs) Max:36.9 C (98.5 F)      Temperature: 36.9 C (98.4 F) (09/04/11 0323)  BP (Non-Invasive): 143/76 mmHg (09/04/11 0323)  Heart Rate: 88  (09/04/11 0323)  Respiratory Rate: 18  (09/04/11 0323)  Pain Score: 3 (09/04/11 0114)  SpO2-1: 96 % (09/03/11 2231)    Current Medications:    Current Facility-Administered Medications:  enoxaparin (LOVENOX) 40 mg/0.4 mL SubQ injection 40 mg Subcutaneous Q24H   vancomycin (VANCOCIN) 2,500 mg in NS 500 mL IVPB 15 mg/kg Intravenous Q12H   piperacillin-tazobactam (ZOSYN) 3.375 g in D5W 50 mL IVPB 3.375 g Intravenous Q8H   SSIP insulin R human (HUMULIN R) 100 units/mL injection 2-6 Units Subcutaneous Q6H PRN   ondansetron (ZOFRAN) 2 mg/mL injection 4 mg Intravenous Q6H PRN   diphenhydrAMINE (BENADRYL) capsule 25 mg Oral HS PRN - MR x 1   metoprolol tartrate (LOPRESSOR) tablet 25 mg Oral Q12H   NS flush syringe 2 mL Intracatheter Q8HRS   And      NS flush syringe 2-6 mL Intracatheter Q1 MIN PRN   olmesartan (BENICAR) tablet 20 mg Oral Daily       Today's Physical Exam:  General: no distress  Lungs: non-labored breathing   Abdomen: obese. Soft. Erythema on pannus improved   Genito-urinary: scrotal swelling improved. Erythema on groin improved   Extremities: No cyanosis or edema  Skin: Skin warm and dry  Neurologic: Alert and oriented x3    I/O:   Intake/Output Summary (Last 24 hours) at 09/04/11 0722  Last data filed at 09/04/11 0600   Gross per 24 hour   Intake    574 ml   Output      0 ml   Net    574 ml      I/O current shift:  01/14 0000 - 01/14 0759  In: 574 [I.V.:574]  Out: -     Prophylaxis:  Date Started Date Completed   DVT/PE Lovenox     GI Not indicated          Nutrition/Residuals:  Full Liquid       Labs    Reviewed:   Lab Results for Last 24 Hours:    Results for orders placed during the hospital encounter of 09/03/11 (from the past 24 hour(s))   CBC/DIFF       Component Value Range    WBC 9.8  3.5 - 11.0 THOU/uL    RBC 4.80  4.46 - 5.70 MIL/uL    HGB 14.4  13.1 - 17.3 g/dL    HCT 21.3  08.6 - 57.8 %    MCV 87.9  82.0 - 99.0 fL    MCH 29.9  27.4 - 33.0 pg    MCHC 34.1  31.6 - 35.5 g/dL  RDW 12.5  10.2 - 14.0 %    PLATELET COUNT 357  140 - 450 THOU/uL    MPV 6.3 (*) 7.4 - 10.4 FL    PMN'S 68  40 - 75 %    PMN ABS 6.650  1.5 - 7.7 THOU/uL    LYMPHOCYTES 22  20 - 45 %    LYMPHS ABS 2.170  1.0 - 4.8 THOU/uL    MONOCYTES 7  4 - 13 %    MONOS ABS 0.637  0.1 - 0.9 THOU/uL    EOSINOPHIL 3  1 - 6 %    EOS ABS 0.320 (*) 0.1 - 0.3 THOU/uL    BASOPHILS 0  0 - 1 %    BASOS ABS 0.010  0.0 - 0.2 THOU/uL    NRBC'S 0  0 /100WBC   BASIC METABOLIC PANEL, NON-FASTING       Component Value Range    SODIUM 139  136 - 145 mmol/L    POTASSIUM 3.4 (*) 3.5 - 5.1 mmol/L    CHLORIDE 105  96 - 111 mmol/L    CARBON DIOXIDE 24  23 - 33 mmol/L    ANION GAP 10  5 - 16 mmol/L    CREATININE 0.72  0.62 - 1.27 mg/dL    ESTIMATED GLOMERULAR FILTRATION RATE >59  >59 ml/min/1.49m2    GLUCOSE,NONFAST 192 (*) 65 - 139 mg/dL    BUN 9  8 - 26 mg/dL    BUN/CREAT RATIO 12  6 - 22    CALCIUM 8.5  8.5 - 10.4 mg/dL   C-REACTIVE PROTEIN(CRP),INFLAMMATION       Component Value Range    C-REACTIVE PROTEIN HIGH SENSITIVITY (INFLAMMATION) 3.632 (*) <0.800 mg/dL   SEDIMENTATION RATE       Component Value Range    SEDIMENTATION RATE 28 (*) 0 - 15 mm/hr   PT/INR       Component Value Range    PROTHROMBIN TIME 10.7  9.1 - 11.5 Sec    INR 1.0  0.8 - 1.2   PTT (PARTIAL THROMBOPLASTIN TIME)       Component Value Range    APTT 18.7 (*) 22.0 - 32.0 Sec    POCT WHOLE BLOOD GLUCOSE       Component Value Range    GLUCOSE, POINT OF CARE 284 (*) 70 - 105 mg/dL     Ordered:  None    Radiology Tests   Reviewed: N/A  Ordered:  n/a      Assessment/ Plan:   Active Hospital Problems   (*Primary Problem)   Diagnoses   . *Cellulitis, scrotum   . Sleep apnea     Cont Vanc and Zosyn   Will advance to regular diet   Hypokalemia - will replete     Dispo: Possible d/c to home in 2-3 days       [place] Edwin Morley, MD 09/04/2011, 7:22 AM      I saw and examined the patient.  I reviewed the resident's note.  I agree with the findings and plan of care as documented in the resident's note.  Any exceptions/additions are edited/noted.    Cyril Loosen, MD 09/04/2011, 8:37 AM

## 2011-09-04 NOTE — Pharmacy Vancomycin Dosing (Signed)
Therapeutic Drug Monitoring  09/04/2011      [name],Dean D   Date of Birth:  Jun 17, 1961    Vancomycin 15mg /kg q 12 hours ordered X 2 doses. No levels needed.     [name], [facility]  09/04/2011  12:51 AM

## 2011-09-04 NOTE — Nurses Notes (Signed)
Services at bedside. Scrotum elevated with ice per orders.

## 2011-09-04 NOTE — H&P (Addendum)
[facility]  Urology Admission History and Physical      [name], [name], 51 y.o. male  Date of Admission:  09/03/2011  Date of Birth:  [birth year]    PCP: Dean Settle, MD    Information Obtained from: patient, health care provider and history reviewed via medical record  Chief Complaint: scrotal edema and cellulitis        HPI: (must include no less than 4 of the following main descriptors) Location (of pain): Quality (character of pain) Severity (minimal, mild, severe, scale or 1-10) Duration (how long has pain/sx present) Timing (when does pain/sx occur)  Context (activity at/before onset) Modifying Factors (what makes pain/sx  Better/worse) Associate Sign/Sx (what accompanies main pain/sx)     [name] is a 51 y.o., White male who presents as a transfer from [facility] with worsening scrotal swelling and edema.  Patient reports that about a week ago he noted a rash in his inguinal folds and scrotum and began applying zync oxide.  The rash remained unchanged until yesterday he noted increased swelling of his scrotum with white marks on the skin.  He states that his scrotum is uncomfortable and "burns to touch"  Additionally he has trouble urinating without urine spraying onto his scrotum.  Patient denies any fevers (he reports T max of 99.3 yesterday.  His WBC at outside facility was 10.5 and his U/A showed trace leukocytes.  He denies any issues with urinating and has been having loose BM since starting the Amoxicillin a week ago for bronchitis.  He was given a dose of Zosyn and Imipenim prior to transfer.  Blood and urine cultures were obtained.   Patient has past history of torsion in the "early 1980s" that was repaired emergently.  Additionally has had bilateral inguinal hernia repairs in the past and umbilical hernia repair.  PMH significant for DMII which runs in the 170-200 range, HTN, "split ribs from coughing" and sleep apnea.  Last meal was this morning and patient reports mild nausea due to "not eating all day." Scrotal ultrasound was performed at outside facility and was negative for torsion.         ROS:  MUST comment on all "Abnormal" findings   ROS Other than ROS in the HPI, all other systems were negative.      PAST MEDICAL/ FAMILY/ SOCIAL HISTORY:       Past Medical History   Diagnosis Date   . HTN    . Diabetes    . Sleep apnea    . Morbid obesity      Allergies   Allergen Reactions   . Tylox (Oxycodone-Acetaminophen)      Medications Prior to Admission     Medication    GLIPIZIDE ORAL    take  by mouth.      metFORMIN (GLUCOPHAGE) 850 mg Oral Tablet    take 850 mg by mouth Twice daily with food.      olmesartan (BENICAR) 20 mg Oral Tablet    take 20 mg by mouth Once a day.      metoprolol (LOPRESSOR) 25 mg Oral Tablet    take 25 mg by mouth Twice daily.      amoxicillin (AMOXIL) 500 mg Oral Capsule    take 500 mg by mouth Three times a day.      LIRAGLUTIDE (VICTOZA SUBQ)    by Subcutaneous route.             Current Facility-Administered Medications:  enoxaparin (LOVENOX) 40 mg/0.4 mL SubQ injection 40 mg Subcutaneous Q24H   vancomycin (VANCOCIN) 2,500 mg in NS IVPB 15 mg/kg Intravenous Q12H   piperacillin-tazobactam (ZOSYN) 3.375 g in D5W 50 mL IVPB 3.375 g Intravenous Q8H   SSIP insulin R human (HUMULIN R) 100 units/mL injection 2-6 Units Subcutaneous Q6H PRN   ondansetron (ZOFRAN) 2 mg/mL injection 4 mg Intravenous Q6H PRN   diphenhydrAMINE (BENADRYL) capsule 25 mg Oral HS PRN - MR x 1   metoprolol tartrate (LOPRESSOR) tablet 25 mg Oral Q12H   NS flush syringe 2 mL Intracatheter Q8HRS   And       NS flush syringe 2-6 mL Intracatheter Q1 MIN PRN   olmesartan (BENICAR) tablet 20 mg Oral Daily   diphtheria,pertussis-acel,tetanus (ADACEL) vaccine 0.5 mL Intramuscular Now     Past Surgical History   Procedure Date   . Hx hernia repair      Bilateral   . Hx hernia repair    . Hx orchiopexy      No family history of urologic cancer or adverse reaction to anesthesia     History   Substance Use Topics   . Smoking status: Never Smoker    . Smokeless tobacco: Not on file   . Alcohol Use: No       PHYSICAL EXAMINATION: MUST comment on all "Abnormal" findings       Temperature: 36.9 C (98.5 F)  Heart Rate: 101   BP (Non-Invasive): 168/98 mmHg  Respiratory Rate: 18   SpO2-1: 96 %  General: morbidly obese and no distress  Eyes: Pupils equal and round.   HENT:Head atraumatic and normocephalic  Neck: Unable to assess JVD due to body habitus  Lungs: Clear to auscultation bilaterally.   Cardiovascular: regular rate and rhythm  Abdomen: Soft, non-tender , Obese and mildly distended.    Genito-urinary: Circumcised male with diffuse cellulitis of the inguinal folds, penis and scrotum.  Scrotal edema is roughly the size of large soft ball with several areas of white skin break down.  Scrotum is soft but testicular exam limited due to edema  Extremities: No cyanosis or edema  Skin: Skin warm and dry  Neurologic: Grossly normal, CN II - XII grossly intact   Lymphatics: No lymphadenopathy  Psychiatric: Normal       Labs Ordered/ Reviewed (Please indicate ordered or reviewed)   Reviewed: Labs:  Lab Results for Last 24 Hours:    Results for orders placed during the hospital encounter of 09/03/11 (from the past 24 hour(s))   CBC/DIFF       Component Value Range    WBC 9.8  3.5 - 11.0 THOU/uL    RBC 4.80  4.46 - 5.70 MIL/uL    HGB 14.4  13.1 - 17.3 g/dL    HCT 13.0  86.5 - 78.4 %    MCV 87.9  82.0 - 99.0 fL    MCH 29.9  27.4 - 33.0 pg    MCHC 34.1  31.6 - 35.5 g/dL    RDW 69.6  29.5 - 28.4 %    PLATELET COUNT 357  140 - 450 THOU/uL     MPV 6.3 (*) 7.4 - 10.4 FL    PMN'S 68  40 - 75 %    PMN ABS 6.650  1.5 - 7.7 THOU/uL    LYMPHOCYTES 22  20 - 45 %    LYMPHS ABS 2.170  1.0 - 4.8 THOU/uL    MONOCYTES 7  4 - 13 %  MONOS ABS 0.637  0.1 - 0.9 THOU/uL    EOSINOPHIL 3  1 - 6 %    EOS ABS 0.320 (*) 0.1 - 0.3 THOU/uL    BASOPHILS 0  0 - 1 %    BASOS ABS 0.010  0.0 - 0.2 THOU/uL    NRBC'S 0  0 /100WBC   BASIC METABOLIC PANEL, NON-FASTING       Component Value Range    SODIUM 139  136 - 145 mmol/L    POTASSIUM 3.4 (*) 3.5 - 5.1 mmol/L    CHLORIDE 105  96 - 111 mmol/L    CARBON DIOXIDE 24  23 - 33 mmol/L    ANION GAP 10  5 - 16 mmol/L    CREATININE 0.72  0.62 - 1.27 mg/dL    ESTIMATED GLOMERULAR FILTRATION RATE >59  >59 ml/min/1.75m2    GLUCOSE,NONFAST 192 (*) 65 - 139 mg/dL    BUN 9  8 - 26 mg/dL    BUN/CREAT RATIO 12  6 - 22    CALCIUM 8.5  8.5 - 10.4 mg/dL   C-REACTIVE PROTEIN(CRP),INFLAMMATION       Component Value Range    C-REACTIVE PROTEIN HIGH SENSITIVITY (INFLAMMATION) 3.632 (*) <0.800 mg/dL   SEDIMENTATION RATE       Component Value Range    SEDIMENTATION RATE 28 (*) 0 - 15 mm/hr             Radiology Tests Ordered/ Reviewed (Please indicate ordered or reviewed)   Reviewed:   Scrotal U/S :   No evidence of torsion.  No observable fluid collections       Assessment:   51 yo male with scrotal edema, unlikely to be early Fournier's without leukocytosis or evidence of abscess formation     Plan:   Admit: To urology : Floor  Diagnosis: Scrotal cellulitis  Condition: stable   Vitals: q6  Activity: As tolerated   Nursing: Please ensure scrotal support   Diet: Full liquid  Imaging: Reviewed Scrotal ultrasound.  Will hold off on CT at this time  Meds: Home medications, SSIP, Zosyn and Vancomycin    Labs: CBC, BMP  Prophylaxis: Venodynes and Lovenox   Procedures: None   Cardiac Testing: None  Consults: None      Earnest Rosier, MD 09/04/2011, 12:48 AM            I saw and examined the patient.  I reviewed the resident's note.  I agree with the findings and plan of care as documented in the resident's note.  Any exceptions/additions are edited/noted.    Duy Lemming Al-Omar, MD 09/04/2011, 3:22 PM

## 2011-09-04 NOTE — Care Management Notes (Signed)
[facility]  Care Management Initial Evaluation    Patient Name: [name]  Date of Birth: 08/08/1961  Sex: male  Date/Time of Admission: 09/03/2011 10:29 PM  Room/Bed: 805/A  Payor: Investment banker, operational HEALTHCARE  Plan: [place] [facility]  Product Type: Non Managed Care      [name] is a 51 y.o., male    Height/Weight: 172.7 cm (5\' 8" ) / 156.4 kg (344 lb 12.8 oz)    Subjective/Objective: Discharge plan/needs     LOS: 1 day   Admitting Diagnosis: CELLULITIS OF GROIN  Assessment:    09/04/11 1100   Assessment Detail   Assessment Type Admission   Date of Care Management Update 09/04/11   Date of Next DCP Update 09/05/11   Care Management Plan   Discharge Planning Status initial meeting   Projected Discharge Date 09/05/11   CM will evaluate for rehabilitation potential no   Discharge Needs Assessment   Patient has PCP/Pediatrician?  Yes       Name of Provider Dr. Durel Salts   !! Taking Coumadin at home? No   Equipment Currently Used At Home cpap   Equipment Needed After Discharge none   Discharge Facility/Level Of Care Needs Home (Patient/Family Member/other) (code 1)   Transportation Available family or friend will provide   Referral Information   Admission Type observation   Insurance Verified verified-no change   Care Manager Assigned to Case [employer]   Living Environment   Lives With spouse   Quality Of Family Relationships supportive   Able To Return To Prior Living Arrangements yes   Home Safety   Home Assessment: No Problems Identified   Home Accessibility no concerns   Functional Status Current   Ambulation 0-->independent   Transferring 0-->independent   Toileting 0-->independent   Bathing 0-->independent   Dressing 0-->independent   Eating 0-->independent   Communication 0-->understands/communicates without difficulty   Swallowing (If Score 2 Or More For Any Item, Consult Rehab Services) 0-->swallows foods and liquids without difficulty   Functional Status Prior    Ambulation 0-->independent   Transferring 0-->independent   Toileting 0-->independent   Bathing 0-->independent   Dressing 0-->independent   Eating 0-->independent   Communication 0-->understands/communicates without difficulty   Swallowing 0-->swallows foods and liquids without difficulty    Presents with scrotal edema, no evidence of abscess formation. Cellulitis to scrotal/groin area.Blood cultures obtained at outside hospital-IV ABXS initiated.   Met with patient at bedside. Patient alert and oriented x 3. States that he lives in [place] area with his wife. Has support. Will have transportation at discharge. Has RX coverage. Utilies [facility] in [place]. No HH. DME-CPAP. PCP-Dr. Durel Salts.   Discharge Plan:  Discharge to home when medically stable. Anticipate discharge within next 24-48 hours if stable. The patient will continue to be evaluated for developing discharge needs.     Case Manager: Hendricks Limes Irwin Toran, RN 09/04/2011, 3:22 PM  Phone: [phone]

## 2011-09-04 NOTE — Care Management Notes (Signed)
Care Coordinator/Social Work Plan  [place]  Patient Name: [name]   MRN: [id number]   Acct Number: [id number]  DOB: 06-May-1961 Age: 51  **Admission Information**  Patient Type: OBSERVATION  Admit Date: 09/03/2011 Admit Time: 22:29  Admit Reason: CELLULITIS OF GROIN  Admitting Phys: Ellyn Hack   Attending Phys: Ellyn Hack   Unit: 8W Bed: 805-A  160. LOC Notification, CMS Important Message / Detailed Notice  Created by : Adonis Housekeeper Date/Time 2011-09-04 11:37:[id number]  Medical Necessity and Level Of Care Notification  Level of Care Met with pt/family/other and provided education on OBS patient status   Level of Care Notification Patient notified of Level of Care status change to Observation (Date / Time) 09/04/11  1137

## 2011-09-04 NOTE — Care Plan (Signed)
Problem: General Plan of Care(Adult,OB)  Goal: Plan of Care Review(Adult,OB)  The patient and/or their representative will communicate an understanding of their plan of care   Outcome: Ongoing (see interventions/notes)  Discharge Plan:  Discharge to home when medically stable. No discharge needs at this time. The patient will continue to be evaluated for developing discharge needs.

## 2011-09-05 LAB — CBC/DIFF
BASOPHILS: 1 % (ref 0–1)
BASOS ABS: 0.048 10*3/uL (ref 0.0–0.2)
EOS ABS: 0.384 10*3/uL — ABNORMAL HIGH (ref 0.1–0.3)
EOSINOPHIL: 4 % (ref 1–6)
HCT: 40.1 % (ref 39.8–50.2)
HGB: 13.4 g/dL (ref 13.1–17.3)
LYMPHOCYTES: 26 % (ref 20–45)
LYMPHS ABS: 2.22 THOU/uL (ref 1.0–4.8)
MCH: 29.8 pg (ref 27.4–33.0)
MCHC: 33.3 g/dL (ref 31.6–35.5)
MCV: 89.5 fL (ref 82.0–99.0)
MONOCYTES: 7 % (ref 4–13)
MONOS ABS: 0.598 THOU/uL (ref 0.1–0.9)
MPV: 6.4 FL — ABNORMAL LOW (ref 7.4–10.4)
NRBC'S: 0 /100{WBCs}
PLATELET COUNT: 315 10*3/uL (ref 140–450)
PMN ABS: 5.39 10*3/uL (ref 1.5–7.7)
PMN'S: 62 % (ref 40–75)
RBC: 4.48 MIL/uL (ref 4.46–5.70)
RDW: 12.6 % (ref 10.2–14.0)
WBC: 8.6 THOU/uL (ref 3.5–11.0)

## 2011-09-05 LAB — PERFORM POC WHOLE BLOOD GLUCOSE
GLUCOSE, POINT OF CARE: 178 mg/dL — ABNORMAL HIGH (ref 70–105)
GLUCOSE, POINT OF CARE: 269 mg/dL — ABNORMAL HIGH (ref 70–105)
GLUCOSE, POINT OF CARE: 178 mg/dL — ABNORMAL HIGH (ref 70–105)

## 2011-09-05 LAB — HGA1C (HEMOGLOBIN A1C WITH EST AVG GLUCOSE)
ESTIMATED AVERAGE GLUCOSE: 255 mg/dL
HEMOGLOBIN A1C: 10.5 % — ABNORMAL HIGH (ref 4.0–6.0)

## 2011-09-05 MED ORDER — SULFAMETHOXAZOLE 800 MG-TRIMETHOPRIM 160 MG TABLET
1.0000 | ORAL_TABLET | Freq: Two times a day (BID) | ORAL | Status: DC
Start: 2011-09-05 — End: 2011-09-06
  Administered 2011-09-05 – 2011-09-06 (×3): 160 mg via ORAL
  Filled 2011-09-05 (×4): qty 1

## 2011-09-05 MED ORDER — HEPARIN (PORCINE) 5,000 UNIT/ML INJECTION SOLUTION
5000.0000 [IU] | Freq: Three times a day (TID) | INTRAMUSCULAR | Status: DC
Start: 2011-09-05 — End: 2011-09-06
  Administered 2011-09-05 – 2011-09-06 (×4): 5000 [IU] via SUBCUTANEOUS
  Filled 2011-09-05 (×7): qty 1

## 2011-09-05 MED ORDER — FLU VACCINE 2012-2013 TVS (65 YR+)(PF) 180 MCG/0.5 ML IM SYRINGE
0.5000 mL | INJECTION | Freq: Once | INTRAMUSCULAR | Status: AC
Start: 2011-09-05 — End: 2011-09-05
  Administered 2011-09-05: 0.5 mL via INTRAMUSCULAR
  Filled 2011-09-05: qty 0.5

## 2011-09-05 MED ORDER — PNEUMOCOCCAL 23 POLYVALENT VACCINE 25 MCG/0.5 ML INJECTION SOLUTION
0.5000 mL | Freq: Once | INTRAMUSCULAR | Status: DC
Start: 2011-09-05 — End: 2011-09-05
  Filled 2011-09-05: qty 0.5

## 2011-09-05 NOTE — Care Plan (Signed)
Problem: General Plan of Care(Adult,OB)  Goal: Plan of Care Review(Adult,OB)  The patient and/or their representative will communicate an understanding of their plan of care   Outcome: Ongoing (see interventions/notes)  Patient admitted for cellulitis of the groin area. Patient scrotum elevated per orders, and ice applied. Monitor patient for pain, VS, and Labs. Patient on CPAP at night for sleep apnea.

## 2011-09-05 NOTE — Progress Notes (Addendum)
[facility]                                                  UROLOGY  PROGRESS NOTE    [name], [name], 51 y.o. male  Date of Admission:  09/03/2011  Date of Birth:  November 02, 1960  Date of Service:  09/05/2011    Post Op Day:   S/P    Chief Complaint: scrotal swelling    Subjective: Denies any new complaints. Denies any fevers or chills.     Vital Signs:  Temp (24hrs) Max:37 C (98.6 F)      Temperature: 37 C (98.6 F) (09/05/11 0342)  BP (Non-Invasive): 143/81 mmHg (09/05/11 0342)  Heart Rate: 87  (09/05/11 0342)  Respiratory Rate: 18  (09/05/11 0342)  Pain Score: 0 (09/05/11 0000)  SpO2-1: 96 % (09/04/11 2000)    Current Medications:    Current Facility-Administered Medications:  trimethoprim-sulfamethoxazole (BACTRIM DS) 160-800mg  per tablet 1 Tab Oral Q12H   heparin 5,000 unit/mL injection 5,000 Units 5,000 Units Subcutaneous Q8HRS   enoxaparin (LOVENOX) 40 mg/0.4 mL SubQ injection 40 mg Subcutaneous Q24H   ondansetron (ZOFRAN) 2 mg/mL injection 4 mg Intravenous Q6H PRN   diphenhydrAMINE (BENADRYL) capsule 25 mg Oral HS PRN - MR x 1   metoprolol tartrate (LOPRESSOR) tablet 25 mg Oral Q12H   NS flush syringe 2 mL Intracatheter Q8HRS   And      NS flush syringe 2-6 mL Intracatheter Q1 MIN PRN   olmesartan (BENICAR) tablet 20 mg Oral Daily   insulin glargine (LANTUS) 100 units/mL injection 25 Units Subcutaneous NIGHTLY   insulin lispro (HUMALOG) 100 units/mL injection 8 Units Subcutaneous 3x/day AC   SSIP insulin lispro (HUMALOG) 100 units/mL injection 3-9 Units Subcutaneous Q4H PRN       Today's Physical Exam:  General: no distress  Lungs: non-labored breathing   Abdomen: obese. Soft. Erythema on pannus unchanged   Genito-urinary: scrotal swelling/erythema unchanged from yesterday afternoon. Mildly tender to palpation   Extremities: No cyanosis or edema  Skin: Skin warm and dry  Neurologic: Alert and oriented x3    I/O:      Intake/Output Summary (Last 24 hours) at 09/05/11 0726  Last data filed at 09/05/11 0600   Gross per 24 hour   Intake   1728 ml   Output    700 ml   Net   1028 ml     I/O current shift:  01/15 0000 - 01/15 0759  In: 48 [I.V.:48]  Out: -     Prophylaxis:  Date Started Date Completed   DVT/PE heparin     GI Not indicated          Nutrition/Residuals:  CALORIE CONTROLLED       Labs    Reviewed:   Lab Results for Last 24 Hours:    Results for orders placed during the hospital encounter of 09/03/11 (from the past 24 hour(s))   POCT WHOLE BLOOD GLUCOSE       Component Value Range    GLUCOSE, POINT OF CARE 258 (*) 70 - 105 mg/dL   POCT WHOLE BLOOD GLUCOSE       Component Value Range    GLUCOSE, POINT OF CARE 240 (*) 70 - 105 mg/dL   C. DIFFICILE DETECTION       Component Value Range  SPECIMEN DESCRIPTION STOOL      SPECIAL REQUESTS NONE      C. DIFF TOXIN ASSAY        Value: Negative for toxigenic C. difficile. A negative result does not exclude C. diff DNA or antigen at levels below assay detection limits. False negative results may occur due to inadequate collection, transport or storage of specimens.       The test performance characteristics were verified by [facility]. These tests have not been validated for children less than 65 years of age. This test will not detect strains of C. difficile that do not contain the tcdB gene. Results should be interpreted in conjunction with other laboratory and clinical data.      This test algorithm utilizes FDA-cleared assays for glutamate dehydrogenase (immunoassay) and toxin gene by PCR amplification using the Cepheid GeneXpert C. Diff Assay on the GeneXpert Dx System.    REPORT STATUS        Value: 09/04/2011      FINAL   POCT WHOLE BLOOD GLUCOSE       Component Value Range    GLUCOSE, POINT OF CARE 221 (*) 70 - 105 mg/dL   CBC/DIFF       Component Value Range    WBC 8.6  3.5 - 11.0 THOU/uL    RBC 4.48  4.46 - 5.70 MIL/uL    HGB 13.4  13.1 - 17.3 g/dL     HCT 16.1  09.6 - 04.5 %    MCV 89.5  82.0 - 99.0 fL    MCH 29.8  27.4 - 33.0 pg    MCHC 33.3  31.6 - 35.5 g/dL    RDW 40.9  81.1 - 91.4 %    PLATELET COUNT 315  140 - 450 THOU/uL    MPV 6.4 (*) 7.4 - 10.4 FL    PMN'S 62  40 - 75 %    PMN ABS 5.390  1.5 - 7.7 THOU/uL    LYMPHOCYTES 26  20 - 45 %    LYMPHS ABS 2.220  1.0 - 4.8 THOU/uL    MONOCYTES 7  4 - 13 %    MONOS ABS 0.598  0.1 - 0.9 THOU/uL    EOSINOPHIL 4  1 - 6 %    EOS ABS 0.384 (*) 0.1 - 0.3 THOU/uL    BASOPHILS 1  0 - 1 %    BASOS ABS 0.048  0.0 - 0.2 THOU/uL    NRBC'S 0  0 /100WBC   POCT WHOLE BLOOD GLUCOSE       Component Value Range    GLUCOSE, POINT OF CARE 178 (*) 70 - 105 mg/dL     Ordered:  None    Radiology Tests   Reviewed: N/A  Ordered:  n/a      Assessment/ Plan:   Active Hospital Problems   (*Primary Problem)   Diagnoses   . *Cellulitis, scrotum   . Sleep apnea     D/c Zosyn. Will start Bactrim  Will obtain urine culture results from OSH    Appreciate endocrine recs.   Heparin for DVT ppx  Encouraged ambulation    Dispo: Plan for d/c to home in 1-2 days       [place] Edwin Morley, MD 09/05/2011, 7:26 AM      I saw and examined the patient.  I reviewed the resident's note.  I agree with the findings and plan of care as documented in the resident's note.  Any exceptions/additions are  edited/noted.    Cyril Loosen, MD 09/05/2011, 8:12 AM

## 2011-09-05 NOTE — Care Plan (Signed)
 Problem: General Plan of Care(Adult,OB)  Goal: Plan of Care Review(Adult,OB)  The patient and/or their representative will communicate an understanding of their plan of care   Outcome: Ongoing (see interventions/notes)  Assess for pain and medicate if needed. Monitor labs vs and i&o. Monitor fingerstick and cover appropriately. Administer antibiotics as ordered.

## 2011-09-05 NOTE — Consults (Signed)
[facility] - Visited [facility] to continue DM education. Discussed hypoglycemia, cause, symptoms, treatment. Discussed Sick day management, BG targets. Recommend that he record BG in log and bring into PCP for review.  Yesterday, demonstrated how to do insulin injection. He states that he has not given any injections at this time. Encouraged him to do so. Demonstrated how to use Lantus SoloSTAR and Kwik Pen along with 2 unit prime. He did a return demonstration without difficult. Again explained that it is undecided what  DM medications he will be D/C home on at this time. He denies having any questions. Will continue to follow.

## 2011-09-05 NOTE — Pharmacy (Signed)
Discharge Pharmacy Service  09/05/2011    [name],Dean D  Jul 04, 1961  805/A/UROLOGY    Date of service: 09/05/2011    Allergies   Allergen Reactions   . Tylox (Oxycodone-Acetaminophen)          Met with patient to :Initiate Discharge Pharmacy Services:  Pending  Freedom of Choice Offerings: Accept  Review of Patient's Medication List: Accept        Sela Hilding, PHAR 09/05/2011, 11:25 AM

## 2011-09-05 NOTE — Care Plan (Signed)
Problem: General Plan of Care(Adult,OB)  Goal: Plan of Care Review(Adult,OB)  The patient and/or their representative will communicate an understanding of their plan of care   Outcome: Ongoing (see interventions/notes)  Plan of care discussed with patient.  Patient wears cpap from home at night.  Will monitor pt for changes.

## 2011-09-05 NOTE — Nurses Notes (Signed)
 Pt Iv removed 2 people tried could not place IV. Pt is to go home tomorrow  Called Dr. [place] Hubsher, told to leave Pt with no Iv access at this time.

## 2011-09-06 LAB — CBC/DIFF
BASOPHILS: 1 % (ref 0–1)
BASOS ABS: 0.062 THOU/uL (ref 0.0–0.2)
EOSINOPHIL: 4 % (ref 1–6)
HGB: 13.5 g/dL (ref 13.1–17.3)
LYMPHOCYTES: 24 % (ref 20–45)
LYMPHS ABS: 1.98 10*3/uL (ref 1.0–4.8)
MCH: 30.2 pg (ref 27.4–33.0)
MCHC: 34.1 g/dL (ref 31.6–35.5)
MCV: 88.4 fL (ref 82.0–99.0)
MONOCYTES: 7 % (ref 4–13)
MONOS ABS: 0.597 10*3/uL (ref 0.1–0.9)
NRBC'S: 0 /100WBC
PLATELET COUNT: 327 10*3/uL (ref 140–450)
PMN ABS: 5.44 THOU/uL (ref 1.5–7.7)
PMN'S: 64 % (ref 40–75)
RBC: 4.49 MIL/uL (ref 4.46–5.70)
RDW: 12.5 % (ref 10.2–14.0)
WBC: 8.4 THOU/uL (ref 3.5–11.0)

## 2011-09-06 MED ORDER — SULFAMETHOXAZOLE 800 MG-TRIMETHOPRIM 160 MG TABLET
1.00 | ORAL_TABLET | Freq: Two times a day (BID) | ORAL | Status: DC
Start: 2011-09-06 — End: 2011-09-25

## 2011-09-06 MED ORDER — INSULIN GLARGINE (U-100) 100 UNIT/ML SUBCUTANEOUS SOLUTION
25.00 [IU] | Freq: Every evening | SUBCUTANEOUS | Status: DC
Start: 2011-09-06 — End: 2011-09-25

## 2011-09-06 MED ORDER — INSULIN LISPRO (U-100) 100 UNIT/ML SUBCUTANEOUS SOLUTION
8.00 [IU] | Freq: Three times a day (TID) | SUBCUTANEOUS | Status: DC
Start: 2011-09-06 — End: 2011-09-25

## 2011-09-06 MED ORDER — HYDROCODONE 5 MG-ACETAMINOPHEN 325 MG TABLET
1.00 | ORAL_TABLET | ORAL | Status: DC | PRN
Start: 2011-09-06 — End: 2011-09-25

## 2011-09-06 NOTE — Nurses Notes (Signed)
Gave pt benadryl 25 mg po per PRN order.

## 2011-09-06 NOTE — Discharge Summary (Addendum)
DISCHARGE SUMMARY      PATIENT NAMEJamelle, [name]  MRN:  [id number]  DOB:  September 24, 1960    ADMISSION DATE:  09/03/2011  DISCHARGE DATE:  09/06/2011    ATTENDING PHYSICIAN: Cyril Loosen, [name]   PRIMARY CARE PHYSICIAN: [name]     ADMISSION DIAGNOSIS: Cellulitis, scrotum  DISCHARGE DIAGNOSIS:   No resolved problems to display.    Active Hospital Problems   Diagnoses Date Noted    . Principle Problem: Cellulitis, scrotum 09/04/2011    . Sleep apnea 09/04/2011       Resolved Hospital Problems   Diagnoses     There are no active non-hospital problems to display for this patient.     DISCHARGE MEDICATIONS:  Current Discharge Medication List      START taking these medications    Details   insulin glargine (LANTUS) 100 unit/mL Subcutaneous injection (vial) 25 Units by Subcutaneous route every night.  Qty: 1 Units, Refills: 0      insulin lispro (HUMALOG) 100 unit/mL Subcutaneous Solution 8 Units by Subcutaneous route Three times daily before meals.  Qty: 1 Vial, Refills: 0      trimethoprim-sulfamethoxazole (BACTRIM DS) 800-160 mg Oral Tablet take 1 Tab by mouth every 12 hours.  Qty: 24 Tab, Refills: 0      hydrocodone-Acetaminophen (NORCO) 5-325 mg Oral Tablet tablet take 1 Tab by mouth Every 4 hours as needed for Pain.  Qty: 30 Tab, Refills: 0         CONTINUE these medications which have NOT CHANGED    Details   GLIPIZIDE ORAL take  by mouth.        metFORMIN (GLUCOPHAGE) 850 mg Oral Tablet take 850 mg by mouth Twice daily with food.        olmesartan (BENICAR) 20 mg Oral Tablet take 20 mg by mouth Once a day.        metoprolol (LOPRESSOR) 25 mg Oral Tablet take 25 mg by mouth Twice daily.        LIRAGLUTIDE (VICTOZA SUBQ) by Subcutaneous route.           STOP taking these medications       amoxicillin (AMOXIL) 500 mg Oral Capsule Comments:   Reason for Stopping:             DISCHARGE INSTRUCTIONS:     SCHEDULE FOLLOW-UP PHYSICANS OFFICE CENTER SURG SPEC -UROLOGY    Follow-up appointment clinic: SURG SPEC - UROLOGY    Follow-up in: 2 WEEKS    Reason for visit: HOSPITAL DISCHARGE    Followup reason: Cellulitis    Provider: Vicie [name]      DISCHARGE INSTRUCTION - MISC   Patient may resume all home medications. No strenuous activity.Do not drive while on pain medication. Call the office at [phone] if you have any questions. Go to the emergency room if develop increased pain, fever, or if overall generall condition worsens. Please take over the counter stool softener as indicated on packaging        REASON FOR HOSPITALIZATION AND HOSPITAL COURSE:  This is a 51 y.o., obese male who presented as a transfer from [facility] with worsening scrotal swelling and edema on 09/03/11.  He reported that about a week ago he noted a rash in his inguinal folds and scrotum and began applying zync oxide. The rash remained unchanged until 09/02/11 when he noted increased swelling of his scrotum with white marks on the skin. He stated that his scrotum  was uncomfortable and "burns to touch" Additionally he had trouble urinating without urine spraying onto his scrotum. His WBC at outside facility was 10.5 and his U/A showed trace leukocytes. He been taking Amoxicillin for the past week for bronchitis. He was given a dose of Zosyn and Imipenim prior to transfer. He was started Vanc and Zosyn for scrotal cellulitis.  A scrotal US showed no testicular torsion or scrotal fluid collections.  He continues to be afebrile without leukocytosis. His blood sugars were poorly controlled on arrival and an Endocrine consult was obtained.  Recommendations were followed and diabetic education was given to the patient.  He was discharged with the above diabetic changes. 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  On Hospital day 2 he was switched to PO Bactrim.  His scrotal edema continued to improve, as did his inguinal rash/erythema.  He was medically stable on hospital day 3 and discharged to home.  Encouraged better personal hygiene.  Instructed patient on the above discharge instructions. He was discharged on a 12 day course of Bactrim DS and with Norcos.  He will f/u in clinic in 2 weeks.        CONDITION ON DISCHARGE:  A. Ambulation: Full ambulation  B. Self-care Ability: Complete  C. Cognitive Status Alert and Oriented x 3    DISCHARGE DISPOSITION:  Home discharge     cc: Primary Care Physician:  [name]  [address]  [place] [address]      UE:[id number] Physician:  No referring provider defined for this encounter.     Italy Edwin Morley, [name]    Referring providers can utilize [email or web address] to access their referred [employer] patient's information.        I saw and examined the patient.  I reviewed the resident's note.  I agree with the findings and plan of care as documented in the resident's note.  Any exceptions/additions are edited/noted.    Cyril Loosen, [name] 09/06/2011, 11:10 AM

## 2011-09-06 NOTE — Nurses Notes (Signed)
 Discharge papers review with pt, verbalize understanding.  Transport called to take pt out with family.

## 2011-09-06 NOTE — Progress Notes (Addendum)
[facility]                                                  UROLOGY  PROGRESS NOTE    Carvel, Huskins, 51 y.o. male  Date of Admission:  09/03/2011  Date of Birth:  Mar 05, 1961  Date of Service:  09/06/2011    Post Op Day:   S/P    Chief Complaint: scrotal swelling    Subjective: Denies any new complaints. Denies any fevers or chills.     Vital Signs:  Temp (24hrs) Max:37.2 C (99 F)      Temperature: 35.8 C (96.4 F) (09/06/11 0001)  BP (Non-Invasive): 158/93 mmHg (09/06/11 0001)  Heart Rate: 77  (09/06/11 0001)  Respiratory Rate: 18  (09/06/11 0001)  Pain Score: 0 (09/06/11 0800)  SpO2-1: 96 % (09/06/11 0800)    Current Medications:    Current Facility-Administered Medications:  trimethoprim-sulfamethoxazole (BACTRIM DS) 160-800mg  per tablet 1 Tab Oral Q12H   heparin 5,000 unit/mL injection 5,000 Units 5,000 Units Subcutaneous Q8HRS   ondansetron (ZOFRAN) 2 mg/mL injection 4 mg Intravenous Q6H PRN   diphenhydrAMINE (BENADRYL) capsule 25 mg Oral HS PRN - MR x 1   metoprolol tartrate (LOPRESSOR) tablet 25 mg Oral Q12H   NS flush syringe 2 mL Intracatheter Q8HRS   And      NS flush syringe 2-6 mL Intracatheter Q1 MIN PRN   olmesartan (BENICAR) tablet 20 mg Oral Daily   insulin glargine (LANTUS) 100 units/mL injection 25 Units Subcutaneous NIGHTLY   insulin lispro (HUMALOG) 100 units/mL injection 8 Units Subcutaneous 3x/day AC   SSIP insulin lispro (HUMALOG) 100 units/mL injection 3-9 Units Subcutaneous Q4H PRN       Today's Physical Exam:  General: no distress  Lungs: non-labored breathing   Abdomen: obese. Soft. Erythema on pannus unchanged   Genito-urinary: scrotal swelling/erythema unchanged from yesterday afternoon. Mildly tender to palpation   Extremities: No cyanosis or edema  Skin: Skin warm and dry  Neurologic: Alert and oriented x3    I/O:     Intake/Output Summary (Last 24 hours) at 09/06/11 0824  Last data filed at 09/05/11 2300    Gross per 24 hour   Intake   2320 ml   Output      0 ml   Net   2320 ml     I/O current shift:       Prophylaxis:  Date Started Date Completed   DVT/PE heparin     GI Not indicated          Nutrition/Residuals:  CALORIE CONTROLLED       Labs    Reviewed:   Lab Results for Last 24 Hours:    Results for orders placed during the hospital encounter of 09/03/11 (from the past 24 hour(s))   POCT WHOLE BLOOD GLUCOSE       Component Value Range    GLUCOSE, POINT OF CARE 269 (*) 70 - 105 mg/dL   POCT WHOLE BLOOD GLUCOSE       Component Value Range    GLUCOSE, POINT OF CARE 167 (*) 70 - 105 mg/dL   POCT WHOLE BLOOD GLUCOSE       Component Value Range    GLUCOSE, POINT OF CARE 178 (*) 70 - 105 mg/dL   CBC/DIFF       Component  Value Range    WBC 8.4  3.5 - 11.0 THOU/uL    RBC 4.49  4.46 - 5.70 MIL/uL    HGB 13.5  13.1 - 17.3 g/dL    HCT 16.1 (*) 09.6 - 50.2 %    MCV 88.4  82.0 - 99.0 fL    MCH 30.2  27.4 - 33.0 pg    MCHC 34.1  31.6 - 35.5 g/dL    RDW 04.5  40.9 - 81.1 %    PLATELET COUNT 327  140 - 450 THOU/uL    MPV 6.4 (*) 7.4 - 10.4 FL    PMN'S 64  40 - 75 %    PMN ABS 5.440  1.5 - 7.7 THOU/uL    LYMPHOCYTES 24  20 - 45 %    LYMPHS ABS 1.980  1.0 - 4.8 THOU/uL    MONOCYTES 7  4 - 13 %    MONOS ABS 0.597  0.1 - 0.9 THOU/uL    EOSINOPHIL 4  1 - 6 %    EOS ABS 0.314 (*) 0.1 - 0.3 THOU/uL    BASOPHILS 1  0 - 1 %    BASOS ABS 0.062  0.0 - 0.2 THOU/uL    NRBC'S 0  0 /100WBC   POCT WHOLE BLOOD GLUCOSE       Component Value Range    GLUCOSE, POINT OF CARE 168 (*) 70 - 105 mg/dL     Ordered:  None    Radiology Tests   Reviewed: N/A  Ordered:  n/a      Assessment/ Plan:   Active Hospital Problems   (*Primary Problem)   Diagnoses   . *Cellulitis, scrotum   . Sleep apnea     Did well overnight  Afebrile  Tolerating Bactrim  D/c home today      Italy P Hubsher, MD 09/06/2011, 8:24 AM    Italy P. Hubsher, M.D.  Resident, [facility]  Division of Urology       I saw and examined the patient.  I reviewed the resident's note.  I agree with the findings and plan of care as documented in the resident's note.  Any exceptions/additions are edited/noted.    Cyril Loosen, MD 09/06/2011, 9:16 AM

## 2011-09-09 LAB — ADULT ROUTINE BLOOD CULTURE, SET OF 2 BOTTLES (BACTERIA AND YEAST)
CULTURE OBSERVATION: NO GROWTH
CULTURE OBSERVATION: NO GROWTH
REPORT STATUS: 1192013
REPORT STATUS: 1192013
SPECIAL REQUESTS: 1
SPECIAL REQUESTS: 1

## 2011-09-25 ENCOUNTER — Ambulatory Visit: Payer: 59 | Attending: Urology | Admitting: Urology

## 2011-09-25 ENCOUNTER — Encounter (INDEPENDENT_AMBULATORY_CARE_PROVIDER_SITE_OTHER): Payer: Self-pay | Admitting: Urology

## 2011-09-25 VITALS — BP 132/74 | Temp 99.4°F | Ht 68.0 in | Wt 346.0 lb

## 2011-09-25 DIAGNOSIS — Z09 Encounter for follow-up examination after completed treatment for conditions other than malignant neoplasm: Secondary | ICD-10-CM | POA: Insufficient documentation

## 2011-09-25 NOTE — Progress Notes (Signed)
S:  Returns for follow up, history of scrotal cellulitis, healed nicely and without complaints today.    O:  Examination:  Well-developed, well-nourished obese male  in no apparent distress.  Abdomen is free of masses, tenderness or hepatosplenomegaly  CVA tenderness: none .  Scrotum c/d/i, no abscess palpable, little edema as well    A:  Normal GU exam    P:  Patient advised to lose weight from his 346 lb baseline as well as exercise and void on a regular schedule q2-3 hours.  Advised to perform self exam to make sure he does not develop another cellulitis.  Follow up in 3 months.    Cyril Loosen, MD  Professor and Chief  Urology Residency Program Director  Division of Urology

## 2011-09-27 ENCOUNTER — Encounter (INDEPENDENT_AMBULATORY_CARE_PROVIDER_SITE_OTHER): Payer: Self-pay | Admitting: Neurological Surgery

## 2011-09-27 ENCOUNTER — Ambulatory Visit (INDEPENDENT_AMBULATORY_CARE_PROVIDER_SITE_OTHER): Payer: Worker's Comp, Other unspecified | Admitting: Neurological Surgery

## 2011-09-27 NOTE — H&P (Addendum)
 [facility] Department of Neurosurgery  New Outpatient/Consult    [name]  Date of Service: 09/27/2011  Referring physician: Karlynn VEAR Neptune, MD  [address]  [place], [place] [address]    Gender: male  Handedness: Right handed  Marital Status: Married     Chief Complaint:   Chief Complaint   Patient presents with   . Neck Pain     RADIATES TO BOTH ARMS OCCASIONALLY DOWN SPINE.     SABRA Tingling     LEFT HAND   . Weakness     NOT SO MUCH AFTER PT     History is provided by patient    History of Present Illness  [name] is a pleasant, 51 year old gentleman referred to our clinic for neck and arm pain. He states that on June 27, 2011 he tripped and hit his left lateral neck and shoulder area on an edge of concrete while at work. He immediately developed left-sided neck pain that radiates into the left arm and 4th and 5th fingers. He went to the emergency room at [facility] that same day and was diagnosed with cubital tunnel syndrome. He was referred to physical therapy. Initially, they were doing therapy on his elbow and when he wasn't improving they started looking at his neck. He has no previous history of spine surgery and has not been involved in any type of pain management. He denies any previous problems with his neck or arm prior to the work injury. He has recently been going to physical therapy for his neck as well and is obtaining slight relief. He reports numbness involving the left 4th and 5th finger. He denies fever, weight loss, chest pain, shortness of breath, weakness, bowel or bladder incontinence, gait issues or problems with fine motor skills.    Past History  Current Outpatient Prescriptions   Medication Sig   . gabapentin (NEURONTIN) 300 mg Oral Capsule Take 300 mg by mouth Twice daily.   . metoprolol  (LOPRESSOR ) 25 mg Oral Tablet take 1 Tab by mouth every 12 hours.   . Liraglutide (VICTOZA) 0.6 mg/0.1 mL (18 mg/3 mL) Subcutaneous Pen Injector 1.2 mg by Subcutaneous route Once a day.   .  Metformin  (GLUCOPHAGE ) 1,000 mg Oral Tablet take 1,000 mg by mouth Twice daily with food.   . Olmesartan -Hydrochlorothiazide  (BENICAR  HCT) 40-12.5 mg Oral Tablet take 1 Tab by mouth Once a day.   . Glipizide  (GLUCOTROL ) 10 mg Oral Tablet take 10 mg by mouth Daily before Breakfast. Take 30 minutes before meals    . Aspirin  81 mg Oral Tablet take 81 mg by mouth Once a day.   . Omega-3 Fatty Acids -Vitamin E  (FISH OIL) 1,000 mg Oral Capsule take 1,000 mg by mouth Twice daily.   . multivitamin Oral Tablet take 1 Tab by mouth Once a day.   . nitroglycerin  (NITROSTAT ) 0.4 mg Sublingual Tablet, Sublingual 1 Tab by Sublingual route Every 5 minutes as needed for Chest pain. for 3 doses over 15 minutes   . hydrochlorothiazide  (HYDRODIURIL ) 25 mg Oral Tablet take 25 mg by mouth Once a day.     Allergies   Allergen Reactions   . Tylox (Oxycodone-Acetaminophen )      Past Medical History   Diagnosis Date   . HTN    . Diabetes    . Sleep apnea    . Cellulitis 2013   . Morbid obesity      Past Surgical History   Procedure Date   . Hx hernia  repair      inguinal and umbilical repair    . Hx wisdom teeth extraction    . Hx tonsillectomy        Family History  Family History   Problem Relation Age of Onset   . Diabetes Paternal Uncle        Social History  History     Social History   . Marital Status: Married     Spouse Name: N/A     Number of Children: N/A   . Years of Education: N/A     Occupational History   .       Social History Main Topics   . Smoking status: Never Smoker    . Smokeless tobacco: Never Used   . Alcohol Use: No   . Drug Use: No   . Sexually Active: Not on file     Other Topics Concern   . Right Hand Dominant Yes     Social History Narrative   . No narrative on file       Review of Systems  Other than ROS in the HPI, all other systems were negative.    Examination  BP 130/90  Pulse 112  Temp(Src) 37.6 C (99.6 F) (Tympanic)  Resp 14  Ht 1.727 m (5' 8)  Wt 181.439 kg (400 lb)  BMI 60.82  kg/m2    Constitutional  General appearance: Abnormal: Obese, otherwise normal  Eyes: Ophthalmic exam of optic discs and posterior segments: Not done  Cardiovascular:   Carotid arteries: Not done  Auscultation: Not done  Peripheral vascular system: Normal  Musculoskeletal  Gait and Station: : Normal  Muscle strength (upper extremities): : Normal  Muscle strength (lower extremities): : Normal  Muscle tone (upper extremities): : Normal  Muscle tone (lower extremities): : Normal  Sensation: Abnormal: minimally diminished in the left 5th finger, otherwise normal  Deep tendon reflexes upper and lower extremities: Normal  Coordination: Normal  Phalen's: Abnormal: mildly positive for cubital tunnel on the left, negative on the right    Neurological  Orientation: Normal  Recent and remote memory: Normal  Attention span and concentration: Normal  Language: Normal  Fund of knowledge: Not done        Data reviewed    1. MRI of the Cervical Spine performed on 09/10/2011 on CD/DVD from [facility] and it shows mild multilevel degenerative disc disease, most marked at C6-C7. There is no evidence of disc herniation or cervical stenosis.     Discussions with other providers:     Assessment:    1. 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 Cervical radiculopathy (723.4)  AMB CONSULT/REFERRAL PHYS THERAPY- EXTERNAL, NCS/EMG, OUTSIDE CONSULT/REFERRAL PROVIDER(AMB)   3. 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 In the meantime, we are going to have him continue physical therapy for his neck and left ulnar nerve.    The patient was seen as a shared visit with the co-signing faculty.    Delon Kitty, PA-C  09/27/2011, 3:38 PM    With Dr. Alix   I have seen and examined this patient with the Physician Assistant and concur with assessment and plan.    Zell Hutchinson  Alix, MD

## 2011-10-02 NOTE — H&P (Signed)
[facility] Department of Neurosurgery  [address]  [place], [place] [address]      HISTORY AND PHYSICAL    PATIENT NAME: [name], [name]  CHART NUMBER: [id number]  DATE OF BIRTH: 09-19-1960  DATE OF SERVICE: 09/27/2011    [facility]  [address]  [place], [place] [address]  Telephone: [phone]   Fax:  [phone]    SUBJECTIVE:  I had the pleasure of seeing [name] today in my office.  [name] presents with neck pain, which was left-sided, radiates into his left upper extremity into the fourth and fifth digits.  This began on June 27, 2011, after a trip and fall at work.  He did do physical therapy.  He was originally diagnosed with cubital tunnel and now currently doing physical therapy for his cervical spine.    OBJECTIVE:  On examination, he has no focal motor weakness.  Deep tendon reflexes are 2+ and symmetric in Achilles, patellar, biceps, triceps, brachioradialis.  He has diminished sensation in his left fifth finger, negative Hoffmann's.       DIAGNOSTIC STUDIES:  MRI of the cervical spine performed on September 10, 2011.  There is multilevel degenerative disease with disk bulging, effacing the subarachnoid space at C6-C7.    ASSESSMENT AND PLAN:  Plan is for EMG of his upper extremities, continued physical therapy for the cervical spine and left upper extremity.      Sueanne Margarita, MD, PhD  Assistant Professor, [facility] Department of Neurosurgery  [facility], [place], [place], [place] [address]    HC/WC/[id number]; D: 10/02/2011 07:37:34; T: 10/02/2011 09:03:11    cc: Nehemiah Settle MD      [address]       [place], [place] [address]

## 2011-10-03 ENCOUNTER — Encounter (INDEPENDENT_AMBULATORY_CARE_PROVIDER_SITE_OTHER): Payer: Self-pay | Admitting: Neurological Surgery

## 2011-10-09 ENCOUNTER — Ambulatory Visit (INDEPENDENT_AMBULATORY_CARE_PROVIDER_SITE_OTHER): Payer: Self-pay | Admitting: Neurological Surgery

## 2011-10-09 NOTE — Telephone Encounter (Signed)
Per Terrie @ [employer], the claim adjuster has approved the referral to Neurology and the EMG. Per Barron Alvine, Claim's Adjuster, will set up the appointment.  Once made, it will be faxed to me.    Nanda Quinton, MA 10/09/2011, 10:31 AM

## 2011-11-02 ENCOUNTER — Encounter (INDEPENDENT_AMBULATORY_CARE_PROVIDER_SITE_OTHER): Payer: Self-pay | Admitting: Neurological Surgery

## 2011-11-14 ENCOUNTER — Encounter (INDEPENDENT_AMBULATORY_CARE_PROVIDER_SITE_OTHER): Payer: Self-pay | Admitting: Neurological Surgery

## 2011-12-04 ENCOUNTER — Encounter (INDEPENDENT_AMBULATORY_CARE_PROVIDER_SITE_OTHER): Payer: Self-pay | Admitting: Neurological Surgery

## 2011-12-04 ENCOUNTER — Ambulatory Visit (INDEPENDENT_AMBULATORY_CARE_PROVIDER_SITE_OTHER): Payer: Worker's Compensation | Admitting: Neurological Surgery

## 2011-12-04 VITALS — BP 140/86 | HR 82 | Temp 98.7°F | Resp 16 | Ht 68.0 in | Wt 350.0 lb

## 2011-12-04 DIAGNOSIS — G5620 Lesion of ulnar nerve, unspecified upper limb: Secondary | ICD-10-CM

## 2011-12-04 NOTE — Progress Notes (Addendum)
[facility] Department of Neurosurgery  New Outpatient/Consult    [name]  Date of Service: 12/04/2011  Referring physician: Nehemiah Settle, MD  [address]  [place], [place] [address]      Chief Complaint:   Chief Complaint   Patient presents with   . Hand Pain     LEFT, f/u EMG   . Neck Pain     History is provided by patient    History of Present Illness  [name]  is a pleasant 51 y.o. right handed male presenting today for follow up after an EMG. He presents today with complaints of pain from his left triceps radiating to his ventral forearm and 4th and 5th digits. He has numbness in the same distribution as the pain. He voices that his neck pain is improved.  His symptoms have been present since a work-related injury on 06/27/2011 in which he tripped and fell. He was evaluated by Dr. Jeanine [name] on 11/20/11 whom suggested his symptoms stemmed from a stretched ulnar nerve. He recommended he hold physical therapy and trey a Hely Weber freehand dex brace. He is taking Neurontin 300mg  b.i.d.       Past History  Current Outpatient Prescriptions   Medication Sig   . Saxagliptin (ONGLYZA) 2.5 mg Oral Tablet Take 2.5 mg by mouth Once a day   . gabapentin (NEURONTIN) 300 mg Oral Capsule Take 300 mg by mouth Twice daily.   . Metformin (GLUCOPHAGE) 1,000 mg Oral Tablet take 1,000 mg by mouth Twice daily with food.   . Glipizide (GLUCOTROL) 10 mg Oral Tablet take 10 mg by mouth Daily before Breakfast. Take 30 minutes before meals    . Aspirin 81 mg Oral Tablet take 81 mg by mouth Once a day.   . hydrochlorothiazide (HYDRODIURIL) 25 mg Oral Tablet take 25 mg by mouth Once a day.   Marland Kitchen DISCONTD: GLIPIZIDE ORAL take  by mouth.     . DISCONTD: metFORMIN (GLUCOPHAGE) 850 mg Oral Tablet take 850 mg by mouth Twice daily with food.     Marland Kitchen DISCONTD: olmesartan (BENICAR) 20 mg Oral Tablet take 20 mg by mouth Once a day.     Marland Kitchen DISCONTD: metoprolol (LOPRESSOR) 25 mg Oral Tablet take 25 mg by mouth Twice daily.      Marland Kitchen DISCONTD: LIRAGLUTIDE (VICTOZA SUBQ) by Subcutaneous route.     . metoprolol (LOPRESSOR) 25 mg Oral Tablet take 1 Tab by mouth every 12 hours.   Marland Kitchen DISCONTD: nitroglycerin (NITROSTAT) 0.4 mg Sublingual Tablet, Sublingual 1 Tab by Sublingual route Every 5 minutes as needed for Chest pain. for 3 doses over 15 minutes   . Olmesartan-Hydrochlorothiazide (BENICAR HCT) 40-12.5 mg Oral Tablet take 1 Tab by mouth Once a day.   . Omega-3 Fatty Acids-Vitamin E (FISH OIL) 1,000 mg Oral Capsule take 1,000 mg by mouth Twice daily.   . multivitamin Oral Tablet take 1 Tab by mouth Once a day.   Marland Kitchen DISCONTD: Liraglutide (VICTOZA) 0.6 mg/0.1 mL (18 mg/3 mL) Subcutaneous Pen Injector 1.2 mg by Subcutaneous route Once a day.     Allergies   Allergen Reactions   . Tylox (Oxycodone-Acetaminophen)      Past Medical History   Diagnosis Date   . HTN    . Diabetes    . Sleep apnea    . Cellulitis 2013   . Morbid obesity      Past Surgical History   Procedure Date   . Hx hernia repair  Bilateral   . Hx hernia repair    . Hx orchiopexy    . Hx hernia repair      inguinal and umbilical repair    . Hx wisdom teeth extraction    . Hx tonsillectomy        Family History  Family History   Problem Relation Age of Onset   . Diabetes Paternal Uncle        Social History    History     Social History   . Marital Status: Married     Spouse Name: N/A     Number of Children: N/A   . Years of Education: N/A     Occupational History   .       Social History Main Topics   . Smoking status: Never Smoker    . Smokeless tobacco: Never Used   . Alcohol Use: No   . Drug Use: No   . Sexually Active: Not on file     Other Topics Concern   . Right Hand Dominant Yes     Social History Narrative    ** Merged History Encounter **        Review of Systems:  Negative for fever, weight loss, chest pain, shortness of breath, abdominal pain, bowel or bladder incontinence, weakness and gait disturbance. All other systems reviewed are negative.      Physical Examination:   BP 140/86   Pulse 82   Temp(Src) 37.1 C (98.7 F) (Tympanic)   Resp 16   Ht 1.727 m (5\' 8" )   Wt 158.759 kg (350 lb)   BMI 53.22 kg/m2  General: Patient appears stated age and in no apparent distress. Vital signs are stable. Afebrile.  Head: normocephalic, atraumatic   Neck: supple, no obvious masses  Musculoskeletal: + Tinel's left ulnar groove  Neuro: Alert &  Oriented x 3. Cranial nerves grossly intact: extraocular muscles are intact, speech and language are normal and appropriate, tongue is midline and shoulders elevate symmetrically. Hearing is grossly intact. Short term, long term memory, attention span and concentration were all grossly normal.  Motor examination is 5/5 in bilateral upper extremities and 5/5 in bilateral lower extremities.  DTR are 2+ and symmetric in upper and lower extremities with downgoing toes. No clonus. Negative Hoffman bilaterally.  Gait is normal. Sensation is delayed in left 4th & 5th digits    Imaging:  I reviewed the film myself and the radiology report, my interpretation is as follows:  EMG 11/20/11- no evidence for neuropathy    Assessment/Plan:  Plan is to hold physical therapy at this time. His neck pain is improved. Dr. Jeanine [name] recommends a hely weber freehand dex brace. We will follow up with him in 1 month.     Patient was seen and discussed with Dr. Idolina [name] whom recommends the plan above.    Dean Cedillos Stottlemire, PA-C 12/04/2011, 1:13 PM       I have seen and examined this patient with the Physician Assistant and concur with assessment and plan.    Debbra Riding, MD

## 2011-12-05 ENCOUNTER — Other Ambulatory Visit (INDEPENDENT_AMBULATORY_CARE_PROVIDER_SITE_OTHER): Payer: Self-pay

## 2011-12-11 NOTE — Progress Notes (Signed)
[name] Department of Neurosurgery  [address]  [place], [place] [address]      PROGRESS NOTE    PATIENT NAME: [name], [name]  CHART NUMBER: [id number]  DATE OF BIRTH: [birth year]  DATE OF SERVICE: 12/04/2011    [facility]  [address], [address]  [place], [place] [address]  Telephone: [phone]   Fax:  [phone]    SUBJECTIVE:  I had the pleasure of following up with Mr. Nosson Wender today in my office.  Mr. Shankland initially presented to [place] on September 27, 2011, with complaints of neck pain, which radiates into his left upper extremity into the fourth and fifth digits.  On examination, he had no focal weakness.  MRI of his cervical spine performed on January 2013, showed multilevel degenerative disease without significant impingement.  We did order an EMG on his last visit, which was performed on November 20, 2011.  This showed no evidence of compressive neuropathy, cervical radiculopathy or myopathy.  At this point, the patient has been doing physical therapy and his neck pain has improved.  He still is complaining of pain in his left the area of his left triceps and forearm radiating to the fourth and fifth digits of his left hand.    IMPRESSION AND PLAN:  The plan is to hold physical therapy.  We are giving him a script for an elbow brace.  Impression is that of a stretched ulnar nerve.  I will follow up with him in 1 month and check his progress.      Sueanne Margarita, MD, PhD  Assistant Professor, [facility] Department of Neurosurgery  [facility], [place], [place], [place] [address]    KG/MW/[id number]; D: 12/10/2011 17:06:44; T: 12/11/2011 10:56:58    cc: Nehemiah Settle MD      [address]       [place], [place] [address]

## 2011-12-13 ENCOUNTER — Encounter (INDEPENDENT_AMBULATORY_CARE_PROVIDER_SITE_OTHER): Payer: Self-pay | Admitting: Neurological Surgery

## 2011-12-28 ENCOUNTER — Ambulatory Visit (INDEPENDENT_AMBULATORY_CARE_PROVIDER_SITE_OTHER): Payer: 59 | Admitting: Urology

## 2012-01-01 ENCOUNTER — Encounter (INDEPENDENT_AMBULATORY_CARE_PROVIDER_SITE_OTHER): Payer: Self-pay | Admitting: Neurological Surgery

## 2012-01-01 ENCOUNTER — Ambulatory Visit (INDEPENDENT_AMBULATORY_CARE_PROVIDER_SITE_OTHER): Payer: Worker's Compensation | Admitting: Neurological Surgery

## 2012-01-01 VITALS — BP 150/90 | HR 88 | Temp 98.7°F | Resp 16 | Ht 68.0 in | Wt 350.0 lb

## 2012-01-01 DIAGNOSIS — G5620 Lesion of ulnar nerve, unspecified upper limb: Secondary | ICD-10-CM

## 2012-01-01 DIAGNOSIS — S5400XA Injury of ulnar nerve at forearm level, unspecified arm, initial encounter: Secondary | ICD-10-CM

## 2012-01-01 NOTE — Progress Notes (Addendum)
[facility] Department of Neurosurgery    Rafael Bihari  Date of Service: 01/01/2012      Subjective:  Mr. Held is a pleasant 51 y.o. right handed male presenting today for follow up. His last date of service was 12/04/11. At that time he voiced complaints of pain and numbness from his left triceps radiating to his ventral forearm and 4th and 5th digits. His neck pain was improved with physical therapy. He was evaluated by Dr. Jeanine Luz on 11/20/11 whom suggested his symptoms stemmed from a stretched ulnar nerve. He recommended he hold physical therapy and trey a Hely Weber freehand dex brace. Dr. Idolina Primer agreed and we held off on PT and gave him a script for the brace. Today, Mr. Gang states he now feels his left arm symptoms are similar to what they were when they first began after a work injury on June 27, 2011; he tripped and fell at work at that time. He is an EMT. He is unsure if the brace is fitted properly. He describes numbness in his left elbow, ventral forearm and hand and fingers, most pronounced in his left 4th and 5th digits. He denies neck pain. He is taking Neurontin 300mg  b.i.d.     Review of Systems:  Negative for fever, weight loss, chest pain, shortness of breath, abdominal pain, bowel or bladder incontinence, weakness and gait disturbance. All other systems reviewed are negative.     Objective:  Filed Vitals:    01/01/12 1031   BP: 150/90   Pulse: 88   Temp: 37.1 C (98.7 F)   TempSrc: Tympanic   Resp: 16   Height: 1.727 m (5\' 8" )   Weight: 158.759 kg (350 lb)     General: Patient appears stated age, in no acute distress.  Neurological: A&O x 3. Cranial nerves 2-12 grossly intact:  extraocular muscles are intact, speech and language are normal and appropriate, tongue is midline and shoulders elevate symmetrically. Hearing is grossly intact. Motor exam: 5/5 and symmetric in upper extremities. DTRs 2+ and symmetric in upper extremities.  Negative Hoffman's sign bilaterally.        Assessment/Plan:  This is a very pleasant 51 year old gentleman with a stretched ulnar nerve. He c/o numbness in his left elbow, ventral forearm and fingers, mostly the 4th and 5th digits. No motor deficits on exam. We are referring him to orthotics for evaluation of the Hely Weber freehand dex brace, instructions on how to use it, etc.. We are also referring him to Dr. Pattricia Boss for evaluation. Lastly, we would like to obtain a repeat EMG/NCV of his left upper extremity by Dr. Eino Farber. We will follow up with him after the EMG. Patient verbalizes understanding and agreement with the above.     Patient was seen and discussed with Dr.Underwood who recommends the plan above.    Drinda Belgard Stottlemire, PA-C 01/01/2012, 2:27 PM   I have seen and examined this patient with the Physician Assistant and concur with assessment and plan.    Debbra Riding, MD

## 2012-01-02 NOTE — Progress Notes (Signed)
[facility] Department of Neurosurgery  [address]  [place], [place] [address]      PROGRESS NOTE    PATIENT NAME: [name], [name]  CHART NUMBER: [id number]  DATE OF BIRTH: 10-15-1960  DATE OF SERVICE: 01/01/2012    [facility]  [address], [address]  [place], [place] [address]  Telephone: [phone]   Fax:  [phone]    SUBJECTIVE:  I had the pleasure of following up with [name].  I last saw [name] on December 04, 2011, with complaints of neck pain as well as left upper extremity pain, pain radiating from his elbow to the fourth and fifth digits of his left hand.  MRI of his cervical spine has demonstrated multilevel degenerative disease without significant impingement.  EMG showed no evidence of compressive neuropathy cervical radiculopathy or myopathy.  We feel he may have an ulnar nerve injury.  However, this is not verified on EMG.  On his last visit, we had prescribed him a brace.  Apparently, the brace was ordered via mail service.  The patient has no instructions on wearing the brace and I do not feel he is wearing it properly.  The patient does not feel any improvement.  He now feels numbness in his first, second and third digits in addition to the fourth and fifth.    OBJECTIVE:  On examination, he has no focal motor deficits.  He has no Hoffmann's or clonus.    ASSESSMENT AND PLAN:  Plan is to refer the patient to orthotics for instructions on wearing his brace.  I would also like to repeat EMG of his left upper extremity.  I am going to refer him to Dr. Pattricia Boss from orthopaedics for ulnar neuropathy.      Sueanne Margarita, MD, PhD  Assistant Professor, [facility] Department of Neurosurgery  [facility], [place], [place], [place] [address]    KG/MW/[id number]; D: 01/02/2012 15:17:15; T: 01/02/2012 16:37:14    cc: Nehemiah Settle MD      [address]       [place], [place] [address]

## 2012-01-04 ENCOUNTER — Ambulatory Visit (INDEPENDENT_AMBULATORY_CARE_PROVIDER_SITE_OTHER): Payer: Self-pay | Admitting: Neurological Surgery

## 2012-01-04 NOTE — Telephone Encounter (Signed)
Patient called in and said that Dr Idolina Primer told him to take the brace off for now and within 24 hours of taking it off his thumb, index, and middle fingers are back to normal. So his question is since they are back to normal does Dr Idolina Primer still want him to have an EMG done?  Cheyanne D Kerns 01/04/2012, 8:57 AM

## 2012-01-04 NOTE — Telephone Encounter (Signed)
Called patient back told him what Heater stated.     Rafael Bihari - 8:55 AM More Detail >>         Zorita Pang, [place]           Sent: Thu Jan 04, 2012  9:11 AM     To: Albin Fischer                Message      We can hold off on the EMG unless symptoms re-develop since he is seeing Dr. Pattricia Boss regardless and last EMG on 11/20/11 was normal.   ----- Message -----     From: Modena Jansky D     Sent: 01/04/2012   8:57 AM       To: Heather Stottlemire, PA-C             Curtiss Mahmood Heffley   51 y.o. / Male (February 13, 1961)    MRN: [id number]   PCP: Nehemiah Settle, MD   Wt: 158.759 kg (350 lb) (01/01/2012)    Home: [phone]   Work: [phone]   Mobile: [phone]           Reason for Call      Has Questions             Contacts            Type Contact Phone     01/04/2012  8:55 AM Phone (Incoming) [place], [place] (Self) [phone] (H)           Call Documentation      Albin Fischer  01/04/2012  8:57 AM  Signed  Patient called in and said that Dr Idolina Primer told him to take the brace off for now and within 24 hours of taking it off his thumb, index, and middle fingers are back to normal. So his question is since they are back to normal does Dr Idolina Primer still want him to have an EMG done?  Cheyanne D Kerns 01/04/2012, 8:57 AM               Care Advice Given      No Care Advice given for this encounter.         Other Encounter Related Information      Call and Routing History           Other Triage Information           Questionnaires           Medications and Allergies

## 2012-01-09 ENCOUNTER — Encounter (INDEPENDENT_AMBULATORY_CARE_PROVIDER_SITE_OTHER): Payer: Self-pay | Admitting: Medical

## 2012-01-18 ENCOUNTER — Encounter (INDEPENDENT_AMBULATORY_CARE_PROVIDER_SITE_OTHER): Payer: Self-pay | Admitting: Neurological Surgery

## 2012-02-08 ENCOUNTER — Other Ambulatory Visit (INDEPENDENT_AMBULATORY_CARE_PROVIDER_SITE_OTHER): Payer: Self-pay | Admitting: Neurological Surgery

## 2012-02-26 ENCOUNTER — Ambulatory Visit
Admission: RE | Admit: 2012-02-26 | Discharge: 2012-02-26 | Disposition: A | Discharge status: Home / Self Care | Payer: Worker's Compensation | Source: Ambulatory Visit | Attending: SURGERY OF THE HAND | Admitting: SURGERY OF THE HAND

## 2012-02-26 ENCOUNTER — Encounter (INDEPENDENT_AMBULATORY_CARE_PROVIDER_SITE_OTHER): Payer: Self-pay | Admitting: SURGERY OF THE HAND

## 2012-02-26 ENCOUNTER — Ambulatory Visit (HOSPITAL_BASED_OUTPATIENT_CLINIC_OR_DEPARTMENT_OTHER): Payer: Worker's Compensation | Admitting: SURGERY OF THE HAND

## 2012-02-26 ENCOUNTER — Other Ambulatory Visit (INDEPENDENT_AMBULATORY_CARE_PROVIDER_SITE_OTHER): Payer: Self-pay | Admitting: SURGERY OF THE HAND

## 2012-02-26 VITALS — BP 154/89 | HR 101 | Temp 97.7°F | Ht 68.0 in | Wt 355.6 lb

## 2012-02-26 DIAGNOSIS — S59909A Unspecified injury of unspecified elbow, initial encounter: Secondary | ICD-10-CM | POA: Insufficient documentation

## 2012-02-26 DIAGNOSIS — M21939 Unspecified acquired deformity of unspecified forearm: Secondary | ICD-10-CM | POA: Insufficient documentation

## 2012-02-26 DIAGNOSIS — R209 Unspecified disturbances of skin sensation: Secondary | ICD-10-CM | POA: Insufficient documentation

## 2012-02-26 DIAGNOSIS — M1990 Unspecified osteoarthritis, unspecified site: Secondary | ICD-10-CM | POA: Insufficient documentation

## 2012-02-27 NOTE — H&P (Addendum)
[facility]                              DEPARTMENT OF [place], [place] [address]                                PATIENT NAME: [name], [name] [facility] UEAVWU:[id number]  DATE OF SERVICE:02/26/2012  DATE OF BIRTH: 1961-01-21    HISTORY AND PHYSICAL    CLAIM NUMBER:  [id number].    HISTORY OF PRESENT ILLNESS:  [name] comes to clinic today with left elbow pain and fourth and fifth left finger numbness and tingling.  The patient said that he had a fall onto his left shoulder back in November 2012.  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  The patient said he mostly fell into left shoulder, but since then he has had pain going from the elbow out to the fourth and fifth digits.  The patient's elbow and his shoulder pain have resolved, but the numbness and tingling have persisted.  He said that the symptoms are worse with use and also worse with grip.  The patient said that all in all he feels that the pain within the elbow is slightly better, but due to the persistent numbness, the patient was sent over here for a second opinion.  The patient is right-hand dominant.  The patient denies any issues with dropping anything out of his left hand.    PAST MEDICAL HISTORY:  Significant for diabetes and high blood pressure.    CURRENT MEDICATIONS:  1.  Victoza 1.2 mg daily.  2.  Metformin 1000 mg b.i.d.  3.  Benicar 40/25 daily.  4.  Glipizide 10 mg x2 daily.  5.  Metoprolol ER 25 mg daily.  6.  Multivitamin.  7.  Aspirin 81 mg daily.  8.  Fish oil.    PAST SURGICAL HISTORY:  Significant for umbilical hernia repair, bi-inguinal hernia repair and wisdom teeth removal.    ALLERGIES:  The patient has an allergy to Tylox.     SOCIAL HISTORY:  The patient denies smoking, chewing tobacco or drinking alcohol.  The patient does work as a Naval architect.    FAMILY HISTORY:  Significant for problems with eyes, cancer, ear, nose and throat, diabetes, heart problems, problems with lungs and hypertension.    REVIEW OF SYSTEMS:  Comprehensive review of systems was reviewed and noted was occasional heartburn, problems with numbness and tingling and hay fever or seasonal allergies.  Otherwise, review of systems is negative except for what is noted in the HPI.    PHYSICAL EXAMINATION:  Vitals:  BP 154/89, pulse 101, temperature 97.1 degrees Fahrenheit, weight 161.3 kg, height 5 feet 8 inches.  General:  The patient appears to be in no acute distress.  HEENT:  Atraumatic, normocephalic.  Breathing is nonlabored.  Cardiac:  Regular rate and rhythm.  Abdomen:  Soft, nondistended.  Musculoskeletal:  On exam of the patient's left upper extremity, the patient shows good full range of motion through his left shoulder.  He is also  able to flex and extend his left elbow to full range.  He is tender to palpation over his left olecranon on the medial aspect of it.  He is able on his left hand to make a thumbs-up, make an okay sign and spread his fingers.  Sensation is intact to light touch to median, radial and ulnar nerves.  Upon doing a Tinel's of the cubital tunnel, the patient does note a radiating pain down the ulnar aspect of his forearm and into his fourth and fifth digits.  The patient's skin is warm and dry.  Neurologic:  The patient is alert and oriented x3.     RADIOGRAPHIC DATA:  X-rays today were obtained of the left elbow which showed a small deformity over the lateral epicondyle which could be a persistent growth plate versus possible unlikely LCL avulsion.  The patient also did note that he had x-rays at [facility] and [facility] and an MRI of the C-spine performed at [facility].  We did not have access to these films; however, the case manager did bring the reads.  The MRI report described DJD.  The patient initially had an EMG performed at an outside institution in which we had the report, which was negative for any signs of ulnar neuropathy.    ASSESSMENT AND PLAN:  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  We told the patient that even though the EMG did not show any signs, it can be negative up to 40% of patients with cubital tunnel.  We told the patient that our recommendation would for him to get a brace and that we have an assistive one that we like for this that is spring-loaded that helps to keep the arm steady when the patient is sleeping.  We had our brace people here show the patient the brace to give him an idea so he can go back and talk with his PCP about seeing if that is something that they would like to order for the patient's symptoms.  I told the patient I would like him to give this a try for 6 weeks and at that time if the patient has no luck, then  potentially we need to discuss undergoing surgical decompression of the ulnar nerve.  I told the patient that since this is a workers' comp and he was sent here for a second opinion, treatment decisions are up to the patients primary physician and we will send a copy of this note to his referring physician.  If the referring physician would like [place] to perform the surgery down the road, we will be more than willing to do that.  Otherwise, all the patient's questions were answered and his concerns were addressed.      Theodosia Blender, MD  Resident   [place] Department of Orthopaedics    Gypsy Decant, MD  [facility]ene [facility] Endowed Chair of Orthopedic Surgery  Associate Professor  [facility] Department of Orthopaedics    I personally interviewed and examined this patient. I made the diagnosis and recommended the plan of care. The note was documented by the resident who saw them with me.    Mattie Marlin, MD 02/28/2012, 7:53 AM          EA/VW/[id number]; D: 02/26/2012 15:18:51; T: 02/26/2012 16:09:38    cc: [employer] [facility] [employer]       [address]       [place], [place] [address]  Debbra Riding MD      [address]      [place], [place] [address]

## 2012-03-06 ENCOUNTER — Ambulatory Visit (INDEPENDENT_AMBULATORY_CARE_PROVIDER_SITE_OTHER): Payer: Self-pay | Admitting: Hand Surgery

## 2012-03-14 ENCOUNTER — Other Ambulatory Visit (INDEPENDENT_AMBULATORY_CARE_PROVIDER_SITE_OTHER): Payer: Self-pay | Admitting: Physician Assistant

## 2012-03-14 DIAGNOSIS — S5400XA Injury of ulnar nerve at forearm level, unspecified arm, initial encounter: Secondary | ICD-10-CM

## 2012-05-02 ENCOUNTER — Ambulatory Visit (INDEPENDENT_AMBULATORY_CARE_PROVIDER_SITE_OTHER): Payer: Self-pay | Admitting: Urology

## 2012-09-09 ENCOUNTER — Ambulatory Visit (INDEPENDENT_AMBULATORY_CARE_PROVIDER_SITE_OTHER): Payer: Self-pay | Admitting: Physician Assistant

## 2012-09-09 ENCOUNTER — Encounter (INDEPENDENT_AMBULATORY_CARE_PROVIDER_SITE_OTHER): Payer: Self-pay | Admitting: Physician Assistant

## 2012-09-09 VITALS — Ht 68.0 in | Wt 338.0 lb

## 2012-09-09 MED ORDER — DICLOFENAC SODIUM 75 MG TABLET,DELAYED RELEASE
75.00 mg | DELAYED_RELEASE_TABLET | Freq: Two times a day (BID) | ORAL | Status: DC
Start: 2012-09-09 — End: 2012-10-29

## 2012-09-09 NOTE — Patient Instructions (Signed)
F/U in 2 weeks

## 2012-09-10 NOTE — H&P (Addendum)
[facility]  [address]  [address]  [place], [place] [address]  [phone]      OFFICE VISIT    PATIENT NAME:       [name], [name]  VISIT IDENTIFICATION  [id number]  MEDICAL RECORD NUMBER [id number]    DICTATING PHYSICIAN: Despina Hick, PA-C   REFERRING PHYSICIAN:     DOB: Oct 08, 1960  DOS: 09/09/2012    cc:      CHIEF COMPLAINT:  Bilateral knee pain.     HISTORY OF PRESENT ILLNESS:  [name] is a 52 year old male who presents today for evaluation on his  bilateral knees. He has pain progressing over the past few weeks. He denies  any acute injury. 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 He  did have a locking episode. He pulled off the road and was taken to a local  emergency room (ER). X-rays were obtained and it was discovered he had  degenerative joint disease (DJD)  of his knee. He was instructed to follow up  with an orthopedic office. He reports today for further follow-up. Today he  rates his pain as 5 out of 10 on the right side and 4 out of 10 on the left  side. It is intermittent on the left. It is constant on the right. The pain is  sharp and aching. It is worse with increased periods of sitting. He has been  using ice and hot water, which offers him some relief. He has also been taking  ibuprofen and Aleve, which helps as well. He has is not having any numbness or  tingling in the legs today and is here today for further evaluation.     PAST MEDICAL HISTORY:  Hypertension, diabetes and sleep apnea.       PAST SURGICAL HISTORY:  Hernia repair x2, orchiopexy, wisdom teeth extraction and tonsillectomy.     MEDICATIONS:  Aspirin, Glucotrol, Victoza, Glucophage, Lopressor and Benicar.     ALLERGIES:  TYLOX.     REVIEW OF SYSTEMS:  Review of systems is completed. All systems are negative except those   mentioned in the history of present illness (HPI) and past medical history.      PHYSICAL EXAMINATION:  VITAL SIGNS: Height: 5 feet 8. Weight: 338 pounds. Pain: 4 to 5 out of 10.   GENERAL: [name] is a pleasant 52 year old male in no acute distress. Mood  is appropriate.   HEENT: Normocephalic, atraumatic. Extraocular muscles are intact.   NECK: Demonstrates full range of motion. Trachea is midline.   CHEST: Demonstrates full expansion with inspiration.   HEART: Regular rate and rhythm.   ABDOMEN: Nontender.   EXTREMITIES: Right knee: There is no gross deformity noted. His skin is intact  without lesion. There are no signs of infection. There is no erythema,  drainage or increased warmth. There is mild swelling noted about the knee.  There are no palpable masses. There is no lymphadenopathy. He is tender to  palpation. This is over the medial aspect of the knee over the medial femoral  condyle and over into the patella; otherwise, nontender throughout the  remainder the knee. He does have functional range of motion of 0 degrees to 90  degrees. There is no abnormal laxity noted with varus and valgus stress  testing. He does have moderate crepitus with patella flexion and extension.  Neurovascular function is intact. Sensation is intact throughout the  dermatomes of the lower leg. Dorsalis pedis and posterior tibialis are 2+.     Left knee: No gross deformity is noted. Skin is intact without lesion. There  are no signs of infection. There is no erythema, drainage, or increased  warmth. There are no palpable masses. There is no lymphadenopathy. He is  tender to palpation. Once again, this seems to be more so over the medial  aspect of the knee and over to the medial femoral condyle and onto the  patella. His range of motion is 0 degrees to 110 degrees. There is no abnormal  laxity noted with varus and valgus stress testing. There is moderate crepitus   over the patella with flexion and extension. His neurovascular function is  intact. Sensation is intact throughout the dermatomes of the lower leg.  Dorsalis pedis and posterior tibialis are 22+.       RADIOGRAPHS:  Four views of the left knee and four views of the right knee were obtained  today. There are advanced tricompartmental degenerative changes noted about  the bilateral knees.     ASSESSMENT:   Bilateral knee degenerative joint disease (DJD).     PLAN:  I discussed my evaluation and treatment plan with [name]. At this time,  we did discuss his radiographic findings. We did discuss the natural course of  knee DJD and treatment options. We discussed conservative measures versus  surgical intervention. At this point we will begin with conservative measures.  We will begin with a course of an antiinflammatory. He was given Voltaren 75  milligrams 1 tablet b.i.d., # 60 with no refills. We also discussed the risks  and benefits of corticosteroid injections for symptom relief including the  risks of infection, continued pain, swelling, bleeding, reaction to medication  and tendon and rupture. After all questions were answered to his satisfaction,  he understood and wished to proceed with injection today.     PROCEDURE:  After consent was obtained, proper site identified and time out performed, he  was given an injection to the bilateral knees and 5 cubic centimeters of  lidocaine, 1 cubic centimeter of Depo-Medrol and 1 cubic centimeter Kenalog  was placed in the intraarticular space bilaterally using an anterolateral  approach and sterile technique. The patient tolerated the procedure well.     FOLLOW-UP:     At this time, we did discuss and activity modification. I would like him to  limit his activities over the next 2 weeks. We will then see him back for  reevaluation. If he continues to have pain, we will discuss further  intervention with possible viscosupplementation in the knee versus further   surgical intervention. We will see how he progresses. All questions were  answered to his satisfaction. He understands and agrees with the treatment  plan. We will see him back as directed.                                Despina Hick, PA-C                                 Duard Larsen, MD    d:  09/09/2012 11:55:00  t:  09/10/2012 13:42:38  nb  doc#:   [id number]  voice#:  [id number]  <START FOOTER> Page 3 of 3  <end footer>

## 2012-09-24 ENCOUNTER — Encounter (INDEPENDENT_AMBULATORY_CARE_PROVIDER_SITE_OTHER): Payer: Self-pay | Admitting: Physician Assistant

## 2012-09-24 ENCOUNTER — Ambulatory Visit (INDEPENDENT_AMBULATORY_CARE_PROVIDER_SITE_OTHER): Payer: 59 | Admitting: Physician Assistant

## 2012-09-24 VITALS — BP 152/88 | Ht 68.0 in | Wt 338.0 lb

## 2012-09-24 NOTE — Patient Instructions (Signed)
F/U for supartz

## 2012-09-25 NOTE — Progress Notes (Addendum)
[facility]  [address]  [address]  [place], [place] [address]  [phone]      FOLLOW-UP VISIT    PATIENT NAME:         [name], [name]  VISIT IDENTIFICATION  [id number]  MEDICAL RECORD NUMBER [id number]    DICTATING PHYSICIAN: Despina Hick, PA-C   REFERRING PHYSICIAN: Verita Schneiders, MD      DOB:   Jan 11, 1961  DOS: 09/24/2012    cc:  Verita Schneiders, MD        HISTORY OF PRESENT ILLNESS:  [name] is a 52 year old male who presents today for follow-up on his  bilateral knees. We were treating him conservatively for bilateral knee  degenerative joint disease (DJD). We had did a corticosteroid injection. He  initially had some improvement, but this seemed to wear off fairly quickly. He  rates his pain today as a 6 out of 10. It is constant, burning, sharp, aching  and throbbing. It is worse with movement, improved with rest. He has been  taking Voltaren and ibuprofen, which seems to offer him some relief. He is not  having any numbness or tingling in the leg. He is here today for further  evaluation.     PAST MEDICAL HISTORY, PAST SURGICAL HISTORY, MEDICATIONS, ALLERGIES, REVIEW  SYSTEMS:   Are reviewed today. They are unchanged since his last visit. See dictation of  September 09, 2012.     PHYSICAL EXAMINATION:  VITAL SIGNS:  Blood pressure: 152/88. Height: 5 feet 8 inches. Weight: 238  pounds. Pain is 6 out of 10.   EXTREMITIES:  Bilateral knees: There is no gross deformity noted. Skin is  intact without lesion. No signs of infection. No erythema, drainage, or  increased warmth. No palpable masses. No lymphadenopathy. He is having some  tenderness to palpation over the medial aspect of the knee. She does have  functional range of motion. He is able to flex and extend from approximately 0  degrees to 110 degrees. His neurovascular function is intact. Sensation is  intact throughout the dermatomes of the foot and the ankle. Dorsalis pedis and  posterior tibialis is 2+.     ASSESSMENT:   Bilateral knee degenerative joint disease (DJD).     PLAN:  I discussed my evaluation and treatment plan with [name]. At this time,  we did discuss continued treatment measures. We discussed particularly  continued conservative treatment versus further intervention. After all  questions were answered to his satisfaction, he wishes to proceed with  continued conservative treatment. He has continued pain despite corticosteroid  injections. We discussed further intervention with viscosupplementation. I  would like to obtain Supartz injections for him. We will obtain the  injections. We will then see him back for re-evaluation. He is to call me or  return sooner if he develops any new or concerning symptoms. All questions  were answered to his satisfaction. He does understand and agree with the  treatment plan. We will see him back as directed.                                     Despina Hick, PA-C                                 Duard Larsen, MD  d:  09/24/2012 16:46:32  t:  09/25/2012 08:40:58  cw  doc#:   [id number]  voice#:  [id number]  <START FOOTER> Page 2 of 2  <end footer>

## 2012-10-16 ENCOUNTER — Telehealth (INDEPENDENT_AMBULATORY_CARE_PROVIDER_SITE_OTHER): Payer: Self-pay | Admitting: Physician Assistant

## 2012-10-16 NOTE — Telephone Encounter (Signed)
 PATIENT CALLED TODAY TO SAY HE HAS APPLIED FOR MEDICAID.  ALSO, HE WANTS TO SEE IF YOU CAN GO AHEAD WITH THE SUPARTZ INJECTIONS?  HE WAS ASKING ABOUT SAMPLES.

## 2012-10-17 NOTE — Telephone Encounter (Signed)
 We can try to have him approved for samples

## 2012-10-29 ENCOUNTER — Encounter (INDEPENDENT_AMBULATORY_CARE_PROVIDER_SITE_OTHER): Payer: Self-pay | Admitting: Physician Assistant

## 2012-10-29 ENCOUNTER — Ambulatory Visit (INDEPENDENT_AMBULATORY_CARE_PROVIDER_SITE_OTHER): Payer: MEDICAID | Admitting: Physician Assistant

## 2012-10-29 VITALS — BP 146/86 | Ht 68.0 in | Wt 338.0 lb

## 2012-10-29 MED ORDER — CELECOXIB 200 MG CAPSULE
200.0000 mg | ORAL_CAPSULE | Freq: Every day | ORAL | Status: DC
Start: 2012-10-29 — End: 2012-11-05

## 2012-10-29 NOTE — Patient Instructions (Signed)
F/U in 1 week

## 2012-10-30 NOTE — Progress Notes (Signed)
 [facility]  [address]  [address]  [place], [place] [address]  [phone]      FOLLOW-UP VISIT    PATIENT NAME:         [name], [name]  VISIT IDENTIFICATION  [id number]  MEDICAL RECORD NUMBER [id number]    DICTATING PHYSICIAN: Elsie CLEMENTEEN [name], PA-C   REFERRING PHYSICIAN: VEAR Derrek Pal, MD    DOB:     [birth year]  DOS: 10/29/2012    cc:  VEAR Derrek Pal, MD    HISTORY OF PRESENT ILLNESS:  [name] is a 52 year old male. We have been treating him conservatively  for bilateral knee degenerative joint disease (DJD). We did a course of  corticosteroid injection, which did not resolve his pain. He is here today to  begin Supartz injections. He does report continued pain. He rates it is a 6  out of 10. It is intermittent. It is sore. It is worse with activities. He  denies any alleviating factors. He has been taking Aleve  which seems to offer  little relief. He is here today for his Supartz injections.     PHYSICAL EXAMINATION:  Bilateral knees: Mild swelling. No gross deformity. Skin is intact without  lesion. No signs of infection. No erythema, drainage or increased warmth. No  palpable masses. No lymphadenopathy. He does continue to have some mild  tenderness over the medial aspect of the knee. This seems to be bilateral. His  range of motion is functional. It is 0 degrees to 110 degrees comfortably.  There is no abnormal laxity noted. His neurovascular function is intact.  Sensation is intact throughout the dermatomes of the lower leg. Dorsalis pedis  and posterior tibialis are 2+.     ASSESSMENT:  Bilateral knee degenerative joint disease (DJD).       PLAN:  I discussed my evaluation and treatment plan with [name]. At this time,  he is ready to proceed with Supartz injections. We did discuss the risks and  benefits of these injections.  After all questions were answered to his  satisfaction, he understood and wished to proceed with injection today.     PROCEDURE:  After consent was obtained,  proper site identified and time out performed, he  was given an injection to his bilateral knees. Sigrid was placed in the  intra-articular space, using the anterolateral approach and sterile technique.  The patient tolerated the procedure well. We will plan to see him again in one  week for the second in his 3 part series. In the meantime, we did discuss  continued conservative measures. I would like to start him on a prescription  strength anti-inflammatory. He has had naproxen  and ibuprofen with very little  relief. I would like to start him on a course of Celebrex . He was given  Celebrex  200 milligrams once daily, number 7 with no refills. We will see how  this works for him. If this seems to help, we will give him an extended  prescription. He understands and agrees with treatment plan. We will see him  as directed.                                   Elsie CLEMENTEEN [name], PA-C                                 Fairy Goo, MD  d:  10/29/2012 12:37:20  t:  10/30/2012 09:10:59  ag  doc#:   [id number]  voice#:  [phone]  <START FOOTER> Page 2 of 2  <end footer>

## 2012-11-05 ENCOUNTER — Ambulatory Visit (INDEPENDENT_AMBULATORY_CARE_PROVIDER_SITE_OTHER): Payer: MEDICAID | Admitting: Physician Assistant

## 2012-11-05 ENCOUNTER — Encounter (INDEPENDENT_AMBULATORY_CARE_PROVIDER_SITE_OTHER): Payer: Self-pay | Admitting: Physician Assistant

## 2012-11-05 VITALS — BP 140/82 | Ht 68.0 in | Wt 338.0 lb

## 2012-11-05 MED ORDER — CELECOXIB 200 MG CAPSULE
200.0000 mg | ORAL_CAPSULE | Freq: Every day | ORAL | Status: DC
Start: 2012-11-05 — End: 2012-11-11

## 2012-11-05 NOTE — Patient Instructions (Signed)
F/U in 1 week

## 2012-11-05 NOTE — Progress Notes (Signed)
[facility]  [address]  [address]  [place], [place] [address]  [phone]      FOLLOW-UP VISIT    PATIENT NAME:         [name], [name]  VISIT IDENTIFICATION  [id number]  MEDICAL RECORD NUMBER [id number]    DICTATING PHYSICIAN: Despina Hick, PA-C   REFERRING PHYSICIAN: Nehemiah Settle, MD    DOB:     1961/06/09  DOS: 11/05/2012    cc:  Nehemiah Settle, MD    HISTORY OF PRESENT ILLNESS:  [name] is a 52 year old male who presents today for follow-up on his  bilateral knees. We have been treating him for bilateral knee degenerative  joint disease (DJD). We have been doing a series of Supartz. He is here for  the second in 3 part series. He does report some continued pain. He has also  been taking some Celebrex, which is also helping him with the pain. He rates  his pain as a 6 out of 10. It is intermittent. It is dull and aching. It is  worse with increased periods of ambulation and improved with rest. He is here  today for further evaluation.     PHYSICAL EXAMINATION:  Bilateral knees: Continues to be some mild swelling. No gross deformity. Skin  is intact without lesion. No signs of infection. No erythema, drainage or  increased warmth. No palpable masses. No lymphadenopathy. He is tender to  palpation most noted over the medial aspect of both knees. He does have good  functional range of motion at 0 degrees to 120 degrees. No abnormal laxity is  noted. There is crepitus over the patella with flexion and extension.  Neurovascular function is intact. Sensation is intact throughout dermatomes of  the lower leg. Dorsalis pedis and posterior tibialis is 2+.     ASSESSMENT:  Bilateral knee degenerative joint disease (DJD).       PLAN:  I discussed my evaluation and treatment plan with [name]. At this time,  we did discuss the risks and benefits of Supartz injections. After all  questions were answered to his satisfaction, he understood and wished to  proceed with injection today.     PROCEDURE:   After consent was obtained, proper site identified and time out performed, he  was given an injection to the bilateral knees. [name] was placed in the  intra-articular space using an anterolateral approach and sterile technique.  The patient tolerated the procedure well. At this point, we will plan to see  him again in 1 week for the 3rd injection in his 3 part series. He was given a  prescription for Celebrex 200 milligrams one tablet daily, number 30 with no  refills. All questions were answered to his satisfaction. He understands and  agrees with the treatment plan. We will see him back as directed.                                     Despina Hick, PA-C                                 Duard Larsen, MD        d:  11/05/2012 15:30:42  t:  11/05/2012 18:20:46  ag  doc#:   [id number]  voice#:  [id number]  <START FOOTER> Page 2 of 2  <  end footer>

## 2012-11-11 ENCOUNTER — Other Ambulatory Visit (INDEPENDENT_AMBULATORY_CARE_PROVIDER_SITE_OTHER): Payer: Self-pay | Admitting: Physician Assistant

## 2012-11-11 MED ORDER — MELOXICAM 7.5 MG TABLET
7.5000 mg | ORAL_TABLET | Freq: Every day | ORAL | Status: DC
Start: 2012-11-11 — End: 2012-12-10

## 2012-11-11 NOTE — Telephone Encounter (Signed)
INSURANCE DENIED CELEBREX NEXT CLOSEST THING THEY WILL COVER IS MOBIC.  PLEASE SEE PENDED ORDER.

## 2012-11-12 ENCOUNTER — Encounter (INDEPENDENT_AMBULATORY_CARE_PROVIDER_SITE_OTHER): Payer: Self-pay | Admitting: Physician Assistant

## 2012-11-14 ENCOUNTER — Ambulatory Visit (INDEPENDENT_AMBULATORY_CARE_PROVIDER_SITE_OTHER): Payer: MEDICAID | Admitting: Physician Assistant

## 2012-11-14 ENCOUNTER — Encounter (INDEPENDENT_AMBULATORY_CARE_PROVIDER_SITE_OTHER): Payer: Self-pay | Admitting: Physician Assistant

## 2012-11-14 VITALS — BP 144/98 | Ht 68.0 in | Wt 345.0 lb

## 2012-11-14 NOTE — Patient Instructions (Signed)
F/U in 2 weeks

## 2012-11-28 ENCOUNTER — Encounter (INDEPENDENT_AMBULATORY_CARE_PROVIDER_SITE_OTHER): Payer: Self-pay | Admitting: Physician Assistant

## 2012-11-28 ENCOUNTER — Ambulatory Visit (INDEPENDENT_AMBULATORY_CARE_PROVIDER_SITE_OTHER): Payer: MEDICAID | Admitting: Physician Assistant

## 2012-11-28 VITALS — BP 138/84 | Ht 68.0 in | Wt 345.0 lb

## 2012-11-28 NOTE — Patient Instructions (Signed)
F/U with Dr Jaci Carrel

## 2012-11-29 NOTE — Progress Notes (Addendum)
[facility]  [address]  [address]  [place], [place] [address]  [phone]      FOLLOW-UP VISIT    PATIENT NAME:         [name], [name]  VISIT IDENTIFICATION  [id number]  MEDICAL RECORD NUMBER [id number]    DICTATING PHYSICIAN: Despina Hick, PA-C   REFERRING PHYSICIAN: Verita Schneiders, MD      DOB:   28-Dec-1960  DOS: 11/28/2012    cc:  Verita Schneiders, MD        HISTORY OF PRESENT ILLNESS:  [name] is a 52 year old male who presents today for follow-up on his  bilateral knees. We have been treating him conservatively for bilateral knee  degenerative joint disease (DJD). We have done a course of corticosteroid  injections, a course of Supartz injections and anti-inflammatories, including  Mobic and Celebrex. He initially had some very mild improvement, but continues  to have considerable pain. He rates his pain today as a 5 out of 10. It is  constant. It is heavy. It is aching and throbbing, worse with walking and  standing. He is not having any improvement. He has continued to take the  Mobic, which he feels offers him very little relief. He is here today for  further evaluation.     PAST MEDICAL HISTORY, PAST SURGICAL HISTORY, MEDICATIONS, ALLERGIES, REVIEW OF  SYSTEMS:   Are reviewed today. They are unchanged since his initial visit, see dictation  September 09, 2012.     PHYSICAL EXAMINATION:  VITAL SIGNS:  Blood pressure: 138/84. Height: 5 feet 8 inches. Weight: 345  pounds. Pain is 5 out of 10.   EXTREMITIES:  Bilateral knees: No gross deformity is noted today. No signs of  infection. No erythema, drainage, or increased warmth. No palpable masses. No  lymphadenopathy. He is tender to palpation over the medial aspect of both  knees. Range of motion is 0 degrees to 120 degrees. No abnormal laxity is  noted. His neurovascular function is intact. Sensation is intact throughout  dermatomes of the lower leg. Dorsalis pedis and posterior tibialis is 2+.     ASSESSMENT:   Bilateral knee degenerative joint disease (DJD).     PLAN:  I discussed my evaluation and treatment plan with [name]. At this time, I  did discuss with him that he has failed conservative treatment measures. He  continues to have considerable pain. We discussed at this point further  measures. He would like to discuss surgical measures. So, I did set him up an  appointment with Dr. Shirley Friar to discuss possible total knee arthroplasties.  In the meantime, he will continue to use the Mobic. He has been working on  trying to lose weight as well. He will continue with that. We will have him  follow up with Dr. Shirley Friar as directed. He is call me or return sooner if he  develops any new or concerning symptoms. All questions were answered to his  satisfaction. We will see him as directed.                                 Despina Hick, PA-C                                 Duard Larsen, MD        d:  11/28/2012 12:52:28  t:  11/29/2012 08:42:07  cw  doc#:   [id number]  voice#:  [id number]  <START FOOTER> Page 2 of 2  <end footer>

## 2012-12-10 ENCOUNTER — Encounter (INDEPENDENT_AMBULATORY_CARE_PROVIDER_SITE_OTHER): Payer: Self-pay | Admitting: Sports Medicine

## 2012-12-10 ENCOUNTER — Ambulatory Visit (INDEPENDENT_AMBULATORY_CARE_PROVIDER_SITE_OTHER): Payer: MEDICAID | Admitting: Sports Medicine

## 2012-12-10 VITALS — BP 142/88 | Ht 68.0 in | Wt 349.0 lb

## 2012-12-11 ENCOUNTER — Telehealth (INDEPENDENT_AMBULATORY_CARE_PROVIDER_SITE_OTHER): Payer: Self-pay | Admitting: Sports Medicine

## 2012-12-11 NOTE — Telephone Encounter (Signed)
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  He spoke with vocational rehab and in order for him to get in the letter needs to state that unable to drive due to knees and would be better suited for sedentary work.

## 2012-12-11 NOTE — Telephone Encounter (Signed)
We talked about this yesterday at his appointment.  I dictated this all in my note from yesterday.  I told him he can pick up my office visit note from yesterday, when it is done.

## 2012-12-11 NOTE — Telephone Encounter (Signed)
Patient was notified that he could pick up a copy of Dr. Venita Lick office note tomorrow.

## 2013-04-01 IMAGING — CR DG LUMBAR SPINE COMPLETE 4+V
5 series · 5 of 5 positions shown · non-contrast
Comparison: None.

CLINICAL DATA: Short of breath, pain over the right iliac crest, no
injury

LUMBAR SPINE - COMPLETE 4+ VIEW

[t l-spine a.p. *]
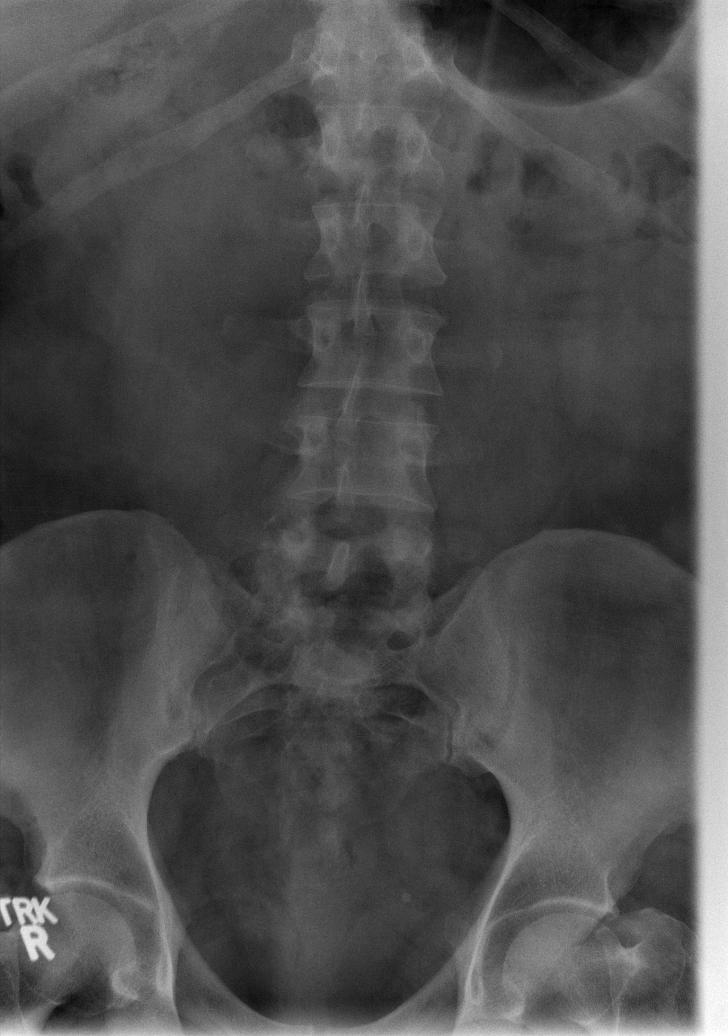

[t l-spine oblique exposure * (1 of 2)]
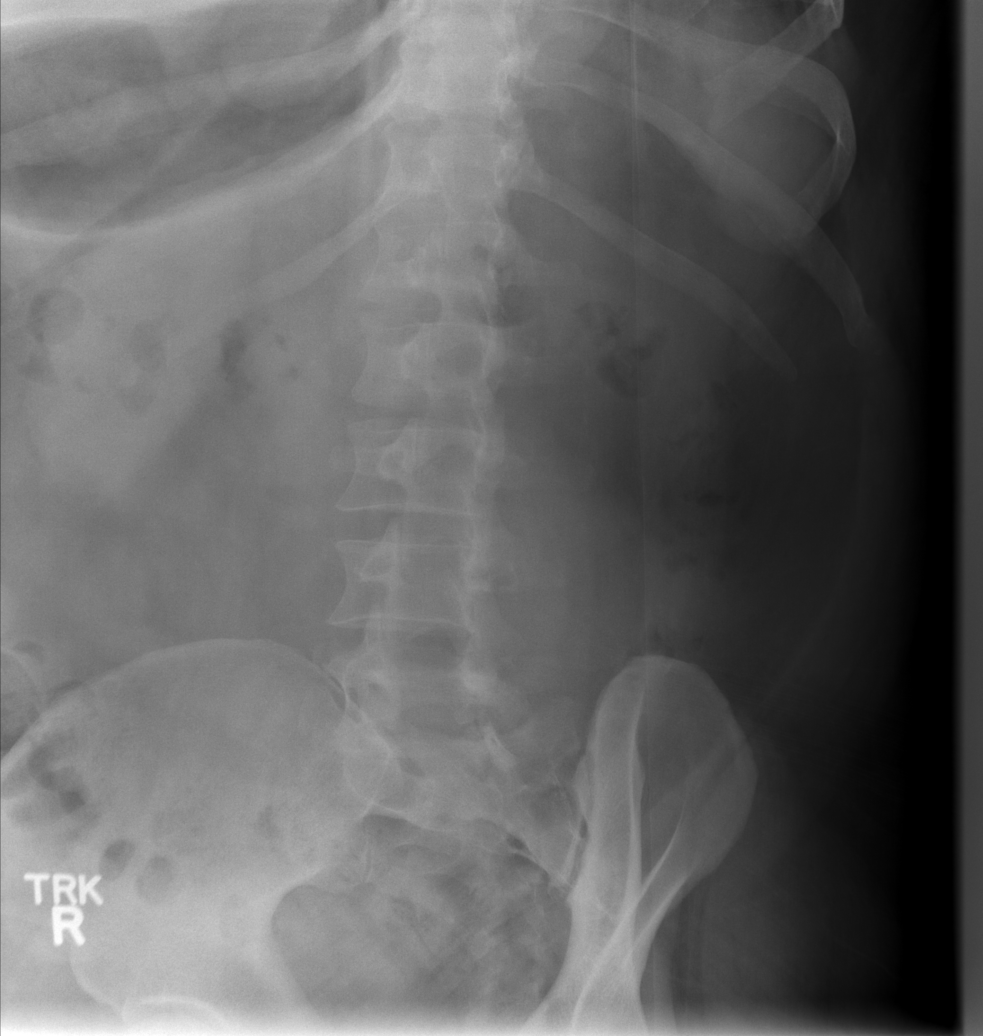

[t l-spine oblique exposure * (2 of 2)]
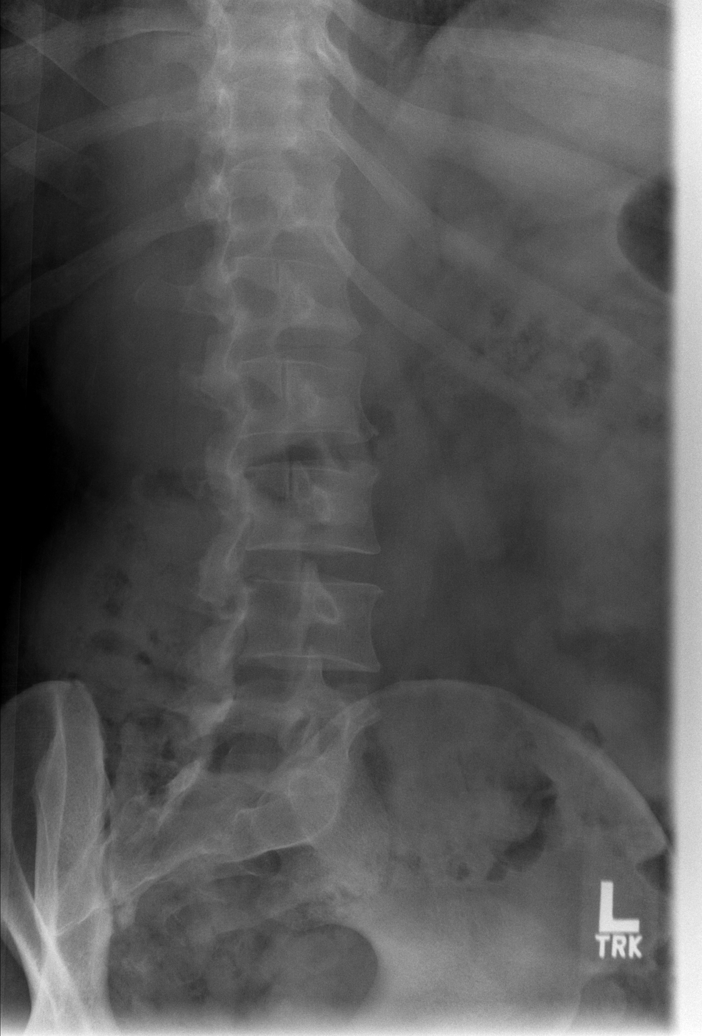

[t l-spine lat *]
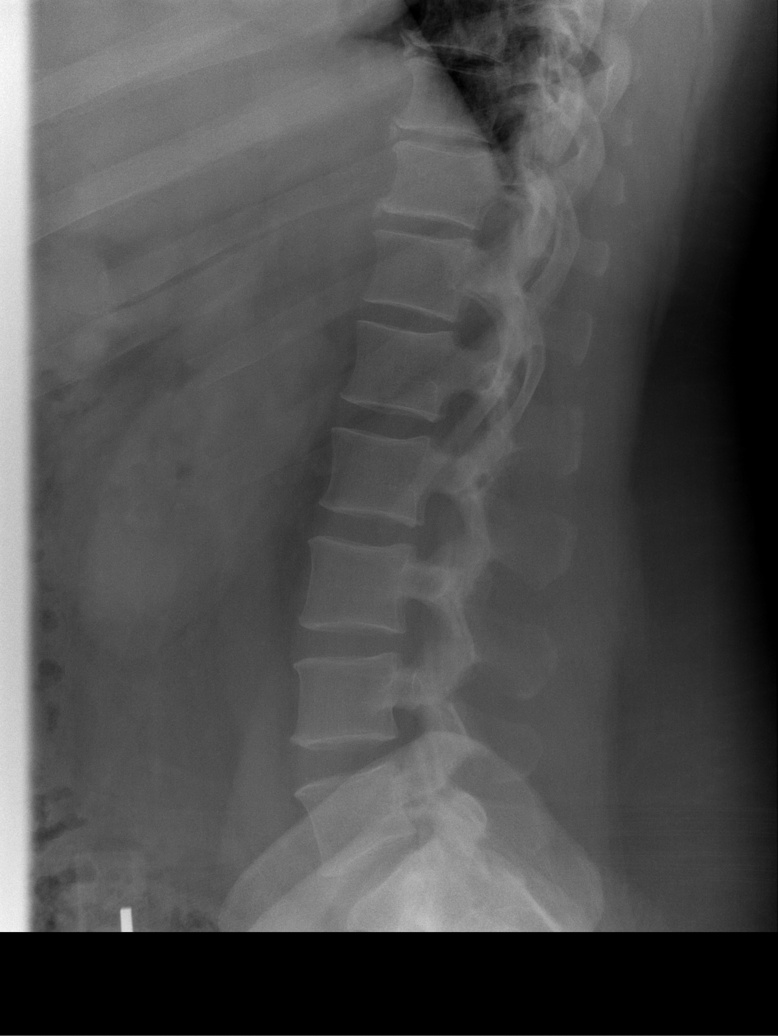

[t l-spine l5-s1 spot *]
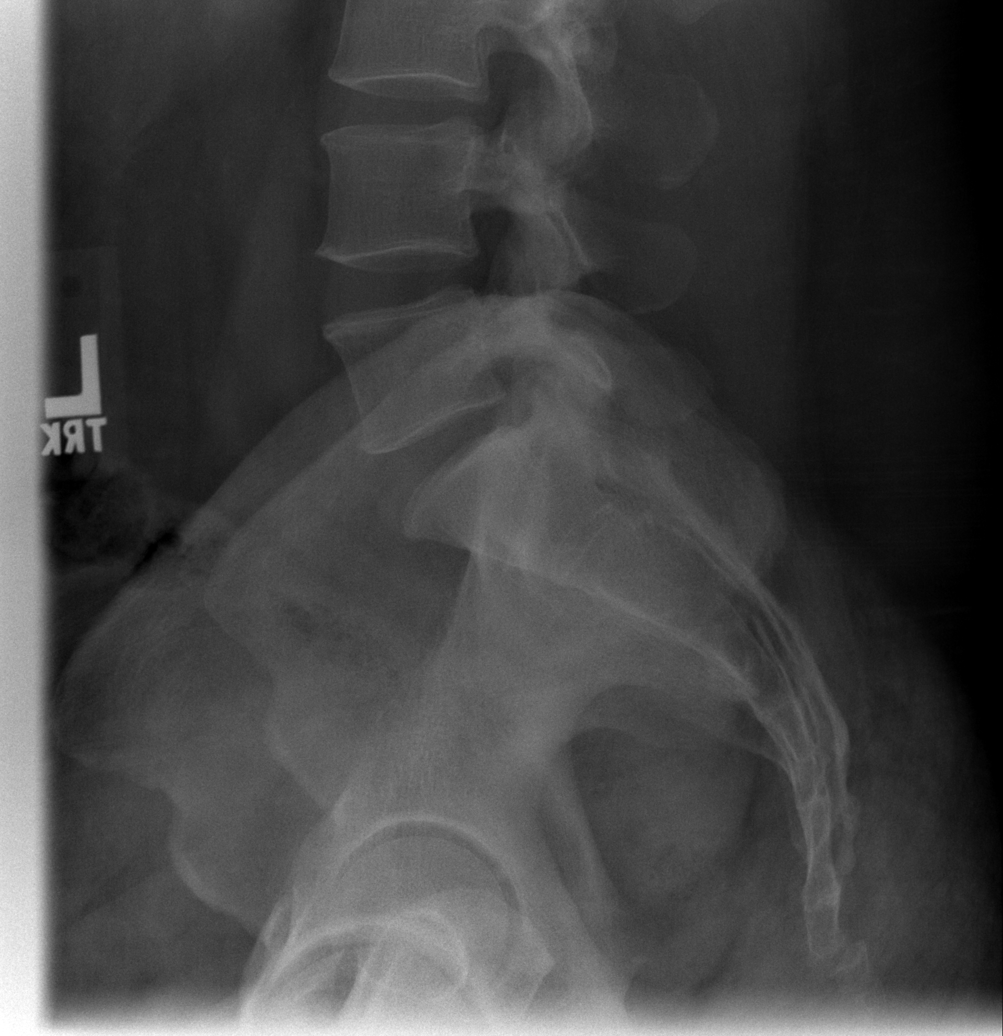

[5 of 5 positions shown; findings below may reference images not displayed]

FINDINGS: The lumbar vertebrae are in normal alignment.
Intervertebral disc spaces appear normal.  No compression deformity
is seen.  No degenerative change is seen.  The SI joints appear
normal.
IMPRESSION: Normal alignment.  Normal disc spaces.

## 2013-09-29 DIAGNOSIS — I498 Other specified cardiac arrhythmias: Secondary | ICD-10-CM

## 2017-08-22 ENCOUNTER — Ambulatory Visit (HOSPITAL_COMMUNITY): Payer: Medicare (Managed Care) | Admitting: Emergency Medicine

## 2017-08-22 ENCOUNTER — Inpatient Hospital Stay
Admission: EM | Admit: 2017-08-22 | Discharge: 2017-08-22 | Disposition: A | Discharge status: Home / Self Care | Payer: Medicare (Managed Care) | Attending: Physician Assistant | Admitting: Physician Assistant

## 2017-08-22 DIAGNOSIS — J329 Chronic sinusitis, unspecified: Secondary | ICD-10-CM

## 2017-08-22 DIAGNOSIS — J019 Acute sinusitis, unspecified: Secondary | ICD-10-CM | POA: Insufficient documentation

## 2017-08-22 MED ORDER — AMOXICILLIN 875 MG-POTASSIUM CLAVULANATE 125 MG TABLET
1.00 | ORAL_TABLET | Freq: Two times a day (BID) | ORAL | 0 refills | Status: AC
Start: 2017-08-22 — End: 2017-09-01

## 2017-08-22 MED ORDER — PROAIR HFA 90 MCG/ACTUATION AEROSOL INHALER
1.0000 | INHALATION_SPRAY | Freq: Four times a day (QID) | RESPIRATORY_TRACT | 0 refills | Status: DC | PRN
Start: 2017-08-22 — End: 2017-09-28

## 2017-08-22 MED ORDER — FLUTICASONE PROPIONATE 50 MCG/ACTUATION NASAL SPRAY,SUSPENSION
1.0000 | Freq: Every day | NASAL | 0 refills | Status: DC
Start: 2017-08-22 — End: 2017-09-28

## 2017-08-22 NOTE — ED Provider Notes (Signed)
[place]  DEPARTMENT OF EMERGENCY MEDICINE  EMERGENCY DEPARTMENT HISTORY AND PHYSICAL      Chief Complaint:    Patient is a 57 y.o.  male [facility] with chief complaint of sinus infection.     History of Present Illness:    Patient complaining about a sinus infection.  Reports he has had sinus symptoms for approximately 3 weeks.  Has been taking over-the-counter cough and cold medicines with little to no relief.  Patient states that he gets a lot of mucus coughed up and blowed out of nose early in the morning in for the rest of the day he just feels really congested.  He denies any known fever.  Denies any chest pain, shortness of breath, nausea/vomiting, abdominal/back pain.      Review of Systems:    Constitutional: No fever, chills  Skin: No rashes or lesions  MSK: No joint pain.  No neck or back pain  Neuro: No numbness, tingling, or weakness.  Psych: No SI or HI. Normal mood  All other symptoms reviewed and are negative, unless commented on in the HPI.     Past Medical History:  Past Medical History:   Diagnosis Date   . Ankle injury    . Cellulitis 2013   . Diabetes    . HTN    . Knee injury    . Morbid obesity (CMS HCC)    . Nerve damage    . Sleep apnea      Past Surgical History:   Procedure Laterality Date   . Hx hernia repair     . Hx hernia repair     . Hx hernia repair     . Hx orchiopexy     . Hx tonsillectomy     . Hx wisdom teeth extraction         Above history reviewed with patient.  Allergies, medication list, and old records also reviewed.     Social History:    Social History     Tobacco Use   . Smoking status: Never Smoker   . Smokeless tobacco: Never Used   Substance Use Topics   . Alcohol use: No   . Drug use: No       Vitals:    Filed Vitals:    08/22/17 1241   BP: (!) 162/70   Pulse: 94   Resp: 20   Temp: 37.2 C (99 F)   SpO2: 97%       Physical Exam:     Nursing note and vitals reviewed.  Vital signs reviewed as above. No acute distress.   Constitutional: Pt is  well-developed and well-nourished.   Head: Normocephalic and atraumatic. Frontal maxillary sinus tenderness  Eyes: Conjunctivae are normal. Pupils are equal, round, and reactive to light. EOM are intact  Ears:  External, canals, TMs normal bilaterally.  Nose:  External normal. Nares normal bilaterally.  Mouth/Throat:  Moist mucous membranes.  No redness, swelling or exudate.  Neck: Soft, supple, full range of motion. No cervical lymphadenopathy.  Pulmonary/Chest: No respiratory distress.  Regular rate and rhythm.  Equal breath sounds bilaterally without wheeze, rales or rhonchi.  Musculoskeletal: Normal range of motion. No deformities.    Neurological: CNs 2-12 grossly intact.  No focal deficits noted.  Skin: No rash or lesions  Psychiatric: Patient has a normal mood and affect.       Labs:  No results found for any visits on 08/22/17.    Imaging:  Orders Placed This Encounter   . amoxicillin-pot clavulanate (AUGMENTIN) 875-125 mg Oral Tablet   . PROAIR HFA 90 mcg/actuation Inhalation HFA Aerosol Inhaler   . fluticasone (FLONASE) 50 mcg/actuation Nasal Spray, Suspension           MDM:     Patient was given the opportunity to ask any questions prior to discharge.  Patient was involved in medical decision making and is agreeable to treatment/discharge plan.  Patient is stable for discharge.     Impression:     Acute sinusitis    Disposition/Plan:    Discharged home with a prescription for Augmentin and albuterol inhaler.      This note was partially generated using MModal Fluency Direct system, and there may be some incorrect words, spellings, and punctuation that were not noted in checking the note before saving.      [employer], PA-C      I was physically present and available for consultation from the [facility] that evaluated the patient, however, I did not personally evaluate the patient during their treatment in the Emergency Department during today's visit.     Gretchen Short, MD

## 2017-08-22 NOTE — ED Nurses Note (Signed)
Patient discharged with written and verbal education pertaining to disease process. Verbally acknowledged understanding.   No questions or concerns at time of d/c. Ambulated to exit.

## 2017-09-27 ENCOUNTER — Ambulatory Visit (INDEPENDENT_AMBULATORY_CARE_PROVIDER_SITE_OTHER): Payer: Self-pay | Admitting: NURSE PRACTITIONER

## 2017-09-28 ENCOUNTER — Encounter (INDEPENDENT_AMBULATORY_CARE_PROVIDER_SITE_OTHER): Payer: Self-pay | Admitting: Family

## 2017-09-28 ENCOUNTER — Ambulatory Visit: Payer: Medicare (Managed Care) | Attending: Family | Admitting: Family

## 2017-09-28 VITALS — BP 136/86 | HR 94 | Temp 99.4°F | Ht 68.0 in | Wt 324.4 lb

## 2017-09-28 DIAGNOSIS — Z6841 Body Mass Index (BMI) 40.0 and over, adult: Secondary | ICD-10-CM | POA: Insufficient documentation

## 2017-09-28 DIAGNOSIS — I10 Essential (primary) hypertension: Secondary | ICD-10-CM | POA: Insufficient documentation

## 2017-09-28 DIAGNOSIS — E119 Type 2 diabetes mellitus without complications: Secondary | ICD-10-CM | POA: Insufficient documentation

## 2017-09-28 DIAGNOSIS — E785 Hyperlipidemia, unspecified: Secondary | ICD-10-CM | POA: Insufficient documentation

## 2017-09-28 DIAGNOSIS — G4730 Sleep apnea, unspecified: Secondary | ICD-10-CM | POA: Insufficient documentation

## 2017-09-28 DIAGNOSIS — F39 Unspecified mood [affective] disorder: Secondary | ICD-10-CM

## 2017-09-28 MED ORDER — FLUOXETINE 10 MG CAPSULE
10.0000 mg | ORAL_CAPSULE | Freq: Every day | ORAL | 2 refills | Status: DC
Start: 2017-09-28 — End: 2018-03-04

## 2017-09-28 MED ORDER — FLUTICASONE 250 MCG-SALMETEROL 50 MCG/DOSE BLISTR POWDR FOR INHALATION
1.0000 | DISK | Freq: Two times a day (BID) | RESPIRATORY_TRACT | 1 refills | Status: AC
Start: 2017-09-28 — End: 2023-10-12

## 2017-09-28 MED ORDER — FLUTICASONE PROPIONATE 50 MCG/ACTUATION NASAL SPRAY,SUSPENSION
1.0000 | Freq: Every day | NASAL | 0 refills | Status: DC
Start: 2017-09-28 — End: 2018-04-02

## 2017-09-28 MED ORDER — OLMESARTAN 40 MG-HYDROCHLOROTHIAZIDE 25 MG TABLET
1.0000 | ORAL_TABLET | Freq: Every day | ORAL | 1 refills | Status: DC
Start: 2017-09-28 — End: 2017-10-09

## 2017-09-28 MED ORDER — PROAIR HFA 90 MCG/ACTUATION AEROSOL INHALER
1.00 | INHALATION_SPRAY | Freq: Four times a day (QID) | RESPIRATORY_TRACT | 0 refills | Status: DC | PRN
Start: 2017-09-28 — End: 2017-11-07

## 2017-09-28 NOTE — Progress Notes (Signed)
SUBJECTIVE:     [name] is a 57 y.o. male  for presenting for initial visit with me to get established for his primary care.    Comes to our facility as a self referral for transfer of care from Dr Milta Deiters office in [place] and records have not been received  Current health problems  Patient Active Problem List    Diagnosis Date Noted   . Cellulitis, scrotum 09/04/2011   . Sleep apnea 09/04/2011   . Chest pain 07/20/2010   . HTN (hypertension) 07/20/2010   . Diabetes mellitus (CMS HCC) 07/20/2010   . Hyperlipidemia 07/20/2010   . Morbid obesity (CMS HCC) 07/20/2010   . Sleep apnea 07/20/2010       Last lipid profile and glucose:  Lab Results   Component Value Date    CHOLESTEROL 162 07/21/2010    HDLCHOL 28 (L) 07/21/2010    LDLCHOL 79 07/21/2010    TRIG 274 (H) 07/21/2010      Pt reports that it has been "at least a year" since he has had lab work.     Past Medical History:   Diagnosis Date   . Ankle injury    . Cellulitis 2013   . Diabetes    . HTN    . Knee injury    . Morbid obesity (CMS HCC)    . Nerve damage    . Sleep apnea          Past Surgical History:   Procedure Laterality Date   . HX HERNIA REPAIR      Bilateral   . HX HERNIA REPAIR     . HX HERNIA REPAIR      inguinal and umbilical repair    . HX ORCHIOPEXY     . HX TONSILLECTOMY     . HX WISDOM TEETH EXTRACTION         Outpatient Medications Prior to Visit:  Aspirin 81 mg Oral Tablet take 81 mg by mouth Once a day.   dapagliflozin (FARXIGA) 10 mg Oral Tablet Take 10 mg by mouth Once a day   furosemide (LASIX) 40 mg Oral Tablet Take 1 Tab by mouth Once a day   Glipizide (GLUCOTROL) 10 mg Oral Tablet take 10 mg by mouth Daily before Breakfast. Take 30 minutes before meals    Ibuprofen (MOTRIN) 400 mg Oral Tablet Take 400 mg by mouth Four times a day as needed for Pain   insulin detemir (LEVEMIR U-100 INSULIN SUBQ) 33 Units by Subcutaneous route Twice daily   Metformin (GLUCOPHAGE) 1,000 mg Oral Tablet Take 1,000 mg by mouth Every morning with  breakfast    metoprolol (LOPRESSOR) 25 mg Oral Tablet take 1 Tab by mouth every 12 hours.   multivitamin Oral Tablet Take 1 Tab by mouth Once a day   fluticasone (FLONASE) 50 mcg/actuation Nasal Spray, Suspension 1 Spray by Each Nostril route Once a day   Liraglutide (VICTOZA 2-PAK) 0.6 mg/0.1 mL (18 mg/3 mL) Subcutaneous Pen Injector 1.8 mg by Subcutaneous route Once a day    Olmesartan-Hydrochlorothiazide (BENICAR HCT) 40-25 mg Oral Tablet Take 1 Tab by mouth Once a day   PROAIR HFA 90 mcg/actuation Inhalation HFA Aerosol Inhaler Take 1-2 Puffs by inhalation Every 6 hours as needed     No facility-administered medications prior to visit.    Family Medical History:     Problem Relation (Age of Onset)    Cancer Mother    Congestive Heart Failure Maternal Grandmother  Diabetes Paternal Uncle            Social History     Socioeconomic History   . Marital status: Married     Spouse name: [name]   . Number of children: 1   . Years of education: Not on file   . Highest education level: Not on file   Social Needs   . Financial resource strain: Not on file   . Food insecurity - worry: Not on file   . Food insecurity - inability: Not on file   . Transportation needs - medical: Not on file   . Transportation needs - non-medical: Not on file   Occupational History     Employer: [employer]   Tobacco Use   . Smoking status: Never Smoker   . Smokeless tobacco: Never Used   Substance and Sexual Activity   . Alcohol use: No   . Drug use: No   . Sexual activity: Not on file   Other Topics Concern   . Uses Cane Not Asked   . Uses walker Not Asked   . Uses wheelchair Not Asked   . Right hand dominant Yes   . Left hand dominant Not Asked   . Ambidextrous Not Asked   . Shift Work Not Asked   . Unusual Sleep-Wake Schedule Not Asked   . Calcium intake adequate Not Asked   . Computer Use Not Asked   . Drives Yes   . Exercise Concern Not Asked   . Helmet Use Not Asked   . Seat Belt Yes   Social History Narrative    **  Merged History Encounter **           Immunization History   Administered Date(s) Administered   . DIPTH,PERTUSSIS-ACEL,TETANUS (ADACEL) >11 YRS OLD    (ADMIN) 09/04/2011   . Influenza Vaccine High Dose IM 09/05/2011   . Pneumovax 09/04/2011       ROS:   Constitutional: negative  Eyes: negative  HEENT: negative  Respiratory: negative  Cardiology: positive for pedal edema  GI: negative  Skin Breast: negative  Psychiatric: positive for mood swings, worsening mood since stopping duloxetine  Endocrine: positive for diabetes, hyperlipidemia, obesity  and thyroid disease    OBJECTIVE:   BP 136/86   Pulse 94   Temp 37.4 C (99.4 F) (Oral)   Ht 1.727 m (5\' 8" )   Wt (!) 147.1 kg (324 lb 6.4 oz)   SpO2 97%   BMI 49.32 kg/m        Body mass index is 49.32 kg/m.   Exam: General appearance: alert, cooperative, no distress, appears stated age  Eyes: conjunctivae/corneas clear. PERRL, EOM's intact. Fundi benign  Ears: normal TM's and external ear canals AU  Nose: Nares normal. Septum midline. Mucosa with inflammation and drainage.   Throat: Lips, mucosa, and tongue normal. Teeth and gums normal  Neck: no adenopathy  Lungs: clear to auscultation bilaterally  Heart: regular rate and rhythm, S1, S2 normal, no murmur, click, rub or gallop  Extremities: extremities with 1+ edema, chronic venous stasis changes noted with healed ulcerations    ASSESSMENT & PLAN:     ICD-10-CM    1. Essential hypertension I10 CBC/Diff     Comprehensive Metabolic Panel (Fasting)     Lipid Panel     Hemoglobin A1C   2. Diabetes mellitus (CMS HCC) E11.9 CBC/Diff     Comprehensive Metabolic Panel (Fasting)     Lipid Panel  Hemoglobin A1C   3. Hyperlipidemia, unspecified hyperlipidemia type E78.5 Comprehensive Metabolic Panel (Fasting)     Lipid Panel   4. Morbid obesity (CMS HCC) E66.01    5. Sleep apnea, unspecified type G47.30      Return in about 1 month (around 10/26/2017).    Encouraged to get regular exercise (60 mins more days than not)   Cheryll DessertAmy  Coffman, [facility] IncPRN  [facility]  [address]  [place] [id number]  Dept: [phone]  Dept Fax: [phone]  Pt was seen independently in clinic. The cosigning physician was available but did not participate in an examination or management of this patient unless otherwise noted.   Lelan PonsAmy B Pearson, MD

## 2017-10-09 ENCOUNTER — Ambulatory Visit (INDEPENDENT_AMBULATORY_CARE_PROVIDER_SITE_OTHER): Payer: Self-pay | Admitting: Family

## 2017-10-09 ENCOUNTER — Ambulatory Visit: Payer: Medicare (Managed Care) | Attending: FAMILY MEDICINE

## 2017-10-09 ENCOUNTER — Encounter (INDEPENDENT_AMBULATORY_CARE_PROVIDER_SITE_OTHER): Payer: Self-pay | Admitting: Family

## 2017-10-09 DIAGNOSIS — E119 Type 2 diabetes mellitus without complications: Secondary | ICD-10-CM | POA: Insufficient documentation

## 2017-10-09 DIAGNOSIS — I10 Essential (primary) hypertension: Principal | ICD-10-CM | POA: Insufficient documentation

## 2017-10-09 DIAGNOSIS — E785 Hyperlipidemia, unspecified: Secondary | ICD-10-CM | POA: Insufficient documentation

## 2017-10-09 LAB — COMPREHENSIVE METABOLIC PNL, FASTING
ALBUMIN/GLOBULIN RATIO: 1.2 (ref 1.1–2.2)
ALBUMIN: 3.8 g/dL (ref 3.5–5.0)
ALKALINE PHOSPHATASE: 69 U/L (ref 38–126)
ALT (SGPT): 41 U/L (ref 17–63)
ANION GAP: 10 mmol/L (ref 5–15)
AST (SGOT): 26 U/L (ref 15–41)
BILIRUBIN TOTAL: 0.7 mg/dL (ref 0.3–1.2)
BUN/CREA RATIO: 23 — ABNORMAL HIGH (ref 6–20)
BUN: 16 mg/dL (ref 8–26)
CALCIUM: 8.5 mg/dL — ABNORMAL LOW (ref 8.9–10.3)
CHLORIDE: 99 mmol/L — ABNORMAL LOW (ref 101–111)
CO2 TOTAL: 25 mmol/L (ref 22–32)
CREATININE: 0.7 mg/dL — ABNORMAL LOW (ref 0.90–1.30)
ESTIMATED GFR: >60 mL/min/1.73mˆ2 (ref >60)
GLUCOSE: 236 mg/dL — ABNORMAL HIGH (ref 70–110)
POTASSIUM: 3.9 mmol/L (ref 3.6–5.1)
PROTEIN TOTAL: 7.1 g/dL (ref 6.4–8.3)
SODIUM: 134 mmol/L — ABNORMAL LOW (ref 136–144)

## 2017-10-09 LAB — CBC WITH DIFF
BASOPHIL #: 0.08 10*3/uL (ref 0.00–0.20)
BASOPHIL %: 1 %
EOSINOPHIL #: 0.12 10*3/uL (ref 0.00–0.50)
EOSINOPHIL #: 0.12 x10?3/uL (ref 0.00–0.50)
EOSINOPHIL %: 2 %
HCT: 49.7 % (ref 41.5–50.4)
HGB: 16.4 g/dL (ref 14.0–17.5)
LYMPHOCYTE #: 2.16 10*3/uL (ref 0.90–4.00)
LYMPHOCYTE %: 31 %
MCH: 29.7 pg (ref 27.5–33.2)
MCHC: 33.1 g/dL (ref 31.6–35.5)
MCHC: 33.1 g/dL (ref 31.6–35.5)
MCV: 89.8 fL (ref 80.0–96.0)
MONOCYTE #: 0.51 10*3/uL (ref 0.00–0.80)
MONOCYTE %: 7 %
MPV: 8.3 fL (ref 6.7–10.4)
NEUTROPHIL #: 4.04 10*3/uL (ref 1.50–7.50)
NEUTROPHIL %: 58 %
PLATELETS: 262 10*3/uL (ref 150–450)
RBC: 5.53 10*6/uL — ABNORMAL HIGH (ref 4.40–5.40)
RDW: 13.2 % (ref 10.5–13.5)
WBC: 6.9 10*3/uL (ref 4.4–11.0)

## 2017-10-09 LAB — HGA1C (HEMOGLOBIN A1C WITH EST AVG GLUCOSE)
ESTIMATED AVERAGE GLUCOSE: 329 mg/dL
HEMOGLOBIN A1C: 13.1 % — ABNORMAL HIGH (ref 4.3–6.1)

## 2017-10-09 LAB — LIPID PANEL
CHOL/HDL RATIO: 6.2 — ABNORMAL HIGH (ref 0.0–5.0)
CHOLESTEROL: 242 mg/dL — ABNORMAL HIGH (ref 0–199)
HDL CHOL: 39 mg/dL (ref 29–71)
LDL CALC: 126 mg/dL — ABNORMAL HIGH (ref 0–100)
TRIGLYCERIDES: 383 mg/dL — ABNORMAL HIGH (ref 0–149)
VLDL CALC: 77 mg/dL — ABNORMAL HIGH (ref 6–49)

## 2017-10-09 MED ORDER — VALSARTAN 160 MG-HYDROCHLOROTHIAZIDE 25 MG TABLET
1.0000 | ORAL_TABLET | Freq: Every day | ORAL | 1 refills | Status: DC
Start: 2017-10-09 — End: 2018-08-12

## 2017-10-09 NOTE — Telephone Encounter (Signed)
Regarding: MEDICATION NOT AVAILABLE  ----- Message from Ranae PlumberKimberly A Riffle sent at 10/08/2017  3:29 PM EST -----  [facility] called and they are not able to get the Olmesartan/HCTZ  40/25mg . Can something else be sent in

## 2017-10-09 NOTE — Telephone Encounter (Signed)
Sent in alternative medication

## 2017-10-10 ENCOUNTER — Encounter (INDEPENDENT_AMBULATORY_CARE_PROVIDER_SITE_OTHER): Payer: Self-pay | Admitting: Family

## 2017-10-10 ENCOUNTER — Other Ambulatory Visit (INDEPENDENT_AMBULATORY_CARE_PROVIDER_SITE_OTHER): Payer: Self-pay | Admitting: Family

## 2017-11-07 ENCOUNTER — Other Ambulatory Visit (INDEPENDENT_AMBULATORY_CARE_PROVIDER_SITE_OTHER): Payer: Self-pay | Admitting: Family

## 2017-11-07 ENCOUNTER — Encounter (INDEPENDENT_AMBULATORY_CARE_PROVIDER_SITE_OTHER): Payer: Self-pay | Admitting: Family

## 2017-11-07 ENCOUNTER — Ambulatory Visit: Payer: Medicare (Managed Care) | Attending: Family | Admitting: Family

## 2017-11-07 VITALS — BP 141/83 | HR 82 | Temp 97.7°F | Ht 68.0 in | Wt 325.0 lb

## 2017-11-07 DIAGNOSIS — I10 Essential (primary) hypertension: Secondary | ICD-10-CM | POA: Insufficient documentation

## 2017-11-07 DIAGNOSIS — E785 Hyperlipidemia, unspecified: Secondary | ICD-10-CM

## 2017-11-07 DIAGNOSIS — Z794 Long term (current) use of insulin: Secondary | ICD-10-CM | POA: Insufficient documentation

## 2017-11-07 DIAGNOSIS — Z6841 Body Mass Index (BMI) 40.0 and over, adult: Secondary | ICD-10-CM | POA: Insufficient documentation

## 2017-11-07 DIAGNOSIS — E119 Type 2 diabetes mellitus without complications: Secondary | ICD-10-CM | POA: Insufficient documentation

## 2017-11-07 DIAGNOSIS — Z1211 Encounter for screening for malignant neoplasm of colon: Secondary | ICD-10-CM

## 2017-11-07 MED ORDER — ROSUVASTATIN 20 MG TABLET
20.00 mg | ORAL_TABLET | Freq: Every evening | ORAL | 1 refills | Status: DC
Start: 2017-11-07 — End: 2018-01-31

## 2017-11-07 MED ORDER — PROAIR HFA 90 MCG/ACTUATION AEROSOL INHALER
1.00 | INHALATION_SPRAY | Freq: Four times a day (QID) | RESPIRATORY_TRACT | 0 refills | Status: DC | PRN
Start: 2017-11-07 — End: 2017-11-07

## 2017-11-07 MED ORDER — INSULIN LISPRO (U-100) 100 UNIT/ML SUBCUTANEOUS PEN
PEN_INJECTOR | SUBCUTANEOUS | 2 refills | Status: DC
Start: 2017-11-07 — End: 2017-12-11

## 2017-11-07 MED ORDER — NAPROXEN 500 MG TABLET
500.00 mg | ORAL_TABLET | Freq: Three times a day (TID) | ORAL | 0 refills | Status: AC | PRN
Start: 2017-11-07 — End: 2017-12-07

## 2017-11-07 NOTE — Progress Notes (Signed)
[facility]  [address]  [place] [id number]    [name]  12/08/1960  [id number]    11/14/2017    Chief complaint: Blood Work (Pt presents today for 1 month follow up on blood work. )        Subjective:     This is a case of a 57 y.o. year old male who comes in today for follow-up of recent lab work results.  Pt complains of left knee pain which he has seen ortho for in the past. He also complains that he has gas and reflux symptoms which has been present for the past few weeks.      Pertinent items are noted in HPI.    Current Outpatient Medications   Medication Sig   . albuterol sulfate (VENTOLIN HFA) 90 mcg/actuation Inhalation HFA Aerosol Inhaler Take 1-2 Puffs by inhalation Every 6 hours as needed   . Aspirin 81 mg Oral Tablet take 81 mg by mouth Once a day.   . dapagliflozin (FARXIGA) 10 mg Oral Tablet Take 10 mg by mouth Once a day   . FLUoxetine (PROZAC) 10 mg Oral Capsule Take 1 Cap (10 mg total) by mouth Once a day for 90 days   . fluticasone (FLONASE) 50 mcg/actuation Nasal Spray, Suspension 1 Spray by Each Nostril route Once a day   . fluticasone-salmeterol (ADVAIR) 250-50 mcg/dose Inhalation Disk with Device oral diskus inhaler Take 1 INHALATION by inhalation Twice daily for 90 days   . furosemide (LASIX) 40 mg Oral Tablet Take 1 Tab by mouth Once a day   . Glipizide (GLUCOTROL) 10 mg Oral Tablet take 10 mg by mouth Daily before Breakfast. Take 30 minutes before meals    . insulin detemir (LEVEMIR U-100 INSULIN SUBQ) 33 Units by Subcutaneous route Twice daily   . insulin lispro (HUMALOG KWIKPEN INSULIN) 100 unit/mL Subcutaneous Insulin Pen Three times a day with meals per sliding scale, up to 36 units a day4   . Metformin (GLUCOPHAGE) 1,000 mg Oral Tablet Take 1,000 mg by mouth Every morning with breakfast    . metoprolol (LOPRESSOR) 25 mg Oral Tablet take 1 Tab by mouth every 12 hours.   . multivitamin Oral Tablet Take 1 Tab by mouth Once a day   . naproxen (NAPROSYN) 500 mg  Oral Tablet Take 1 Tab (500 mg total) by mouth Every 8 hours as needed for Pain for up to 30 days   . rosuvastatin (CRESTOR) 20 mg Oral Tablet Take 1 Tab (20 mg total) by mouth Every evening for 90 days   . valsartan-hydroCHLOROthiazide (DIOVAN HCT) 160-25 mg Oral Tablet Take 1 Tab by mouth Once a day for 90 days       Objective:     BP (!) 141/83   Pulse 82   Temp 36.5 C (97.7 F) (Oral)   Ht 1.727 m (5\' 8" )   Wt (!) 147.4 kg (325 lb)   BMI 49.42 kg/m   General appearance: alert, oriented x 3, in his normal state, cooperative, not in apparent distress, appearing stated age   Lungs: clear to auscultation bilaterally   Heart: regular rate and rhythm, S1, S2 normal, no murmur  Abdomen: soft, non-tender. Bowel sounds normal.  Extremities: extremities normal, atraumatic, no cyanosis or edema     Assessment/Plan     ENCOUNTER DIAGNOSES     ICD-10-CM   1. Colon cancer screening Z12.11   2. Diabetes mellitus (CMS HCC) E11.9   3. Hyperlipidemia,  unspecified hyperlipidemia type E78.5   4. Morbid obesity (CMS HCC) E66.01   5. Essential hypertension I10     Orders Placed This Encounter   . Refer to [facility]   . rosuvastatin (CRESTOR) 20 mg Oral Tablet   . naproxen (NAPROSYN) 500 mg Oral Tablet   . insulin lispro (HUMALOG KWIKPEN INSULIN) 100 unit/mL Subcutaneous Insulin Pen     The patient was given ample opportunity to ask questions and those questions were answered to the patient's satisfaction. The patient was encouraged to be involved in their own care, and all diagnoses, medications, and medication side-effects were discussed.  A copy of the patient's medication list was printed and given to the patient. A good faith effort was made to reconcile the patient's medications.  The patient was told to contact me with any additional questions or concerns, or go to the ED in an emergency.       Follow up:  in 4 week(s)    [name], APRN-BC  Pt was seen independently in clinic. The cosigning physician was available  but did not participate in an examination or management of this patient unless otherwise noted.   Dean Pons, MD

## 2017-11-07 NOTE — Telephone Encounter (Signed)
Changed med

## 2017-11-12 ENCOUNTER — Ambulatory Visit: Payer: Medicare (Managed Care) | Attending: GENERAL SURGERY | Admitting: GENERAL SURGERY

## 2017-11-12 ENCOUNTER — Encounter (INDEPENDENT_AMBULATORY_CARE_PROVIDER_SITE_OTHER): Payer: Self-pay | Admitting: GENERAL SURGERY

## 2017-11-12 DIAGNOSIS — Z1211 Encounter for screening for malignant neoplasm of colon: Secondary | ICD-10-CM | POA: Insufficient documentation

## 2017-11-12 DIAGNOSIS — Z6841 Body Mass Index (BMI) 40.0 and over, adult: Secondary | ICD-10-CM | POA: Insufficient documentation

## 2017-11-12 NOTE — H&P (Signed)
[facility], [facility]   [address]  [place] [address]  History and Physical    Name: [name]  MRN: [id number]  Date:   11/12/2017  PCP: Amy B Pearson, MD    Chief Complaint:   Chief Complaint   Patient presents with   . Colonoscopy     consult        History of Present Illness: [name] is a 56 y.o. male who presents for his 1st colonoscopy.  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  He has gained some weight.  His appetite is too good as he says.  He has no abdominal pain.  He denies chronic constipation as well as chronic diarrhea.  He has no pain with bowel movements.  He has seen no bleeding per rectum.  His family history with regard to colorectal cancer and polyps is unknown as he was adopted.        Past Medical/Surgical History:  Past Medical History:   Diagnosis Date   . Ankle injury    . Cellulitis 2013   . Diabetes    . HTN    . Knee injury    . Morbid obesity (CMS HCC)    . Nerve damage    . Sleep apnea      Past Surgical History:   Procedure Laterality Date   . HX HERNIA REPAIR      Bilateral   . HX HERNIA REPAIR     . HX HERNIA REPAIR      inguinal and umbilical repair    . HX ORCHIOPEXY     . HX TONSILLECTOMY     . HX WISDOM TEETH EXTRACTION       Current Medications:  Outpatient Medications Marked as Taking for the 11/12/17 encounter (Office Visit) with Telena Peyser, MD   Medication Sig   . Aspirin 81 mg Oral Tablet take 81 mg by mouth Once a day.   . dapagliflozin (FARXIGA) 10 mg Oral Tablet Take 10 mg by mouth Once a day   . FLUoxetine (PROZAC) 10 mg Oral Capsule Take 1 Cap (10 mg total) by mouth Once a day for 90 days   . furosemide (LASIX) 40 mg Oral Tablet Take 1 Tab by mouth Once a day   . Glipizide (GLUCOTROL) 10 mg Oral Tablet take 10 mg by mouth Daily before Breakfast. Take 30 minutes before meals    . insulin detemir (LEVEMIR U-100 INSULIN SUBQ) 33 Units by  Subcutaneous route Twice daily   . insulin lispro (HUMALOG KWIKPEN INSULIN) 100 unit/mL Subcutaneous Insulin Pen Three times a day with meals per sliding scale, up to 36 units a day4   . Metformin (GLUCOPHAGE) 1,000 mg Oral Tablet Take 1,000 mg by mouth Every morning with breakfast    . metoprolol (LOPRESSOR) 25 mg Oral Tablet take 1 Tab by mouth every 12 hours.   . multivitamin Oral Tablet Take 1 Tab by mouth Once a day   . naproxen (NAPROSYN) 500 mg Oral Tablet Take 1 Tab (500 mg total) by mouth Every 8 hours as needed for Pain for up to 30 days   . rosuvastatin (CRESTOR) 20 mg Oral Tablet Take 1 Tab (20 mg total) by mouth Every evening for 90 days   . valsartan-hydroCHLOROthiazide (DIOVAN HCT) 160-25 mg Oral Tablet Take 1 Tab by mouth Once a day for 90 days       Allergies:  Allergies   Allergen Reactions   . Tylox [Oxycodone-Acetaminophen]      Social History:  Social History     Tobacco Use   . Smoking status: Never Smoker   . Smokeless tobacco: Never Used   Substance Use Topics   . Alcohol use: No      Family History:  Family Medical History:     Problem Relation (Age of Onset)    Cancer Mother    Congestive Heart Failure Maternal Grandmother    Diabetes Paternal Uncle         Review of Systems:    Review of Systems 11/12/2017   Constitutional: negative   Eyes: positive for       + contacts/glasses   Ears, nose, mouth, and throat: negative   Respiratory: positive for       + shortness of breath with exertion   Cardiovascular: negative   Gastrointestinal: negative   Genitourinary: negative   Integument/breast: negative   Hematologic/lymphatic: negative   Musculoskeletal: positive for       + joint aches;stiff joints   Neurological: negative   Behavioral/Psych: positive for       + depression   Endocrine: negative   Allergic/Immunologic: positive for       + seasonal allergies         Physical Exam  BP 118/66   Pulse 95   Temp 36.9 C (98.5 F) (Oral)   Ht 1.727 m (5' 8")   Wt (!) 148.2 kg (326 lb 11.2 oz)    SpO2 95%   BMI 49.67 kg/m        General: Pleasant male in no acute distress  HEENT: PERRL, EOMI, oropharynx clear, no neck masses, trachea midline, thyroid normal, no adenopathy  Heart: RRR, no murmurs, rubs or gallops  Lungs: Clear to auscultation bilaterally, normal respiratory effort.  Abdomen: Soft, nontender, nondistended, no masses palpable, no hernia, normal liver and spleen, no palpable groin nodes.  Skin: No rashes or induration.  Extremities: Normal gait and station. Hands show no clubbing or cyanosis. 2+ pulses throughout all extremities, no edema.  Breast:Examination of the breast deferred    Assessment:   1. Colon cancer screening          Plan:   Screening colonoscopy.  Standard bowel preparation.    Risks vs. benefits of surgery have been explained to the patient and he/she understands and wishes to proceed.  Also discussed with the patient the likelihood of the patient achieving his or her goals, as well as potential problems that may occur during recuperation.         Cori Henningsen, MD

## 2017-11-12 NOTE — Nursing Note (Signed)
Patient is a 57 y.o. male presents today for a colonoscopy screening. Pt denies any issues at this time with bowel/gastro. Pt states that he is adopted so he doesn't know family history.   Charlann LangeHannah J McCartney  11/12/2017, 13:17

## 2017-11-12 NOTE — H&P (View-Only) (Signed)
[facility], Surgery Dept - [facility]   [address]  [place] [address]  History and Physical    Name: [name]  MRN: [id number]  Date:   11/12/2017  PCP: Lelan Pons, MD    Chief Complaint:   Chief Complaint   Patient presents with   . Colonoscopy     consult        History of Present Illness: [name] is a 57 y.o. male who presents for his 1st colonoscopy.  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  He has gained some weight.  His appetite is too good as he says.  He has no abdominal pain.  He denies chronic constipation as well as chronic diarrhea.  He has no pain with bowel movements.  He has seen no bleeding per rectum.  His family history with regard to colorectal cancer and polyps is unknown as he was adopted.        Past Medical/Surgical History:  Past Medical History:   Diagnosis Date   . Ankle injury    . Cellulitis 2013   . Diabetes    . HTN    . Knee injury    . Morbid obesity (CMS HCC)    . Nerve damage    . Sleep apnea      Past Surgical History:   Procedure Laterality Date   . HX HERNIA REPAIR      Bilateral   . HX HERNIA REPAIR     . HX HERNIA REPAIR      inguinal and umbilical repair    . HX ORCHIOPEXY     . HX TONSILLECTOMY     . HX WISDOM TEETH EXTRACTION       Current Medications:  Outpatient Medications Marked as Taking for the 11/12/17 encounter (Office Visit) with Rebbeca Paul, MD   Medication Sig   . Aspirin 81 mg Oral Tablet take 81 mg by mouth Once a day.   . dapagliflozin (FARXIGA) 10 mg Oral Tablet Take 10 mg by mouth Once a day   . FLUoxetine (PROZAC) 10 mg Oral Capsule Take 1 Cap (10 mg total) by mouth Once a day for 90 days   . furosemide (LASIX) 40 mg Oral Tablet Take 1 Tab by mouth Once a day   . Glipizide (GLUCOTROL) 10 mg Oral Tablet take 10 mg by mouth Daily before Breakfast. Take 30 minutes before meals    . insulin detemir (LEVEMIR U-100 INSULIN SUBQ) 33 Units by  Subcutaneous route Twice daily   . insulin lispro (HUMALOG KWIKPEN INSULIN) 100 unit/mL Subcutaneous Insulin Pen Three times a day with meals per sliding scale, up to 36 units a day4   . Metformin (GLUCOPHAGE) 1,000 mg Oral Tablet Take 1,000 mg by mouth Every morning with breakfast    . metoprolol (LOPRESSOR) 25 mg Oral Tablet take 1 Tab by mouth every 12 hours.   . multivitamin Oral Tablet Take 1 Tab by mouth Once a day   . naproxen (NAPROSYN) 500 mg Oral Tablet Take 1 Tab (500 mg total) by mouth Every 8 hours as needed for Pain for up to 30 days   . rosuvastatin (CRESTOR) 20 mg Oral Tablet Take 1 Tab (20 mg total) by mouth Every evening for 90 days   . valsartan-hydroCHLOROthiazide (DIOVAN HCT) 160-25 mg Oral Tablet Take 1 Tab by mouth Once a day for 90 days  Allergies:  Allergies   Allergen Reactions   . Tylox [Oxycodone-Acetaminophen]      Social History:  Social History     Tobacco Use   . Smoking status: Never Smoker   . Smokeless tobacco: Never Used   Substance Use Topics   . Alcohol use: No      Family History:  Family Medical History:     Problem Relation (Age of Onset)    Cancer Mother    Congestive Heart Failure Maternal Grandmother    Diabetes Paternal Uncle         Review of Systems:    Review of Systems 11/12/2017   Constitutional: negative   Eyes: positive for       + contacts/glasses   Ears, nose, mouth, and throat: negative   Respiratory: positive for       + shortness of breath with exertion   Cardiovascular: negative   Gastrointestinal: negative   Genitourinary: negative   Integument/breast: negative   Hematologic/lymphatic: negative   Musculoskeletal: positive for       + joint aches;stiff joints   Neurological: negative   Behavioral/Psych: positive for       + depression   Endocrine: negative   Allergic/Immunologic: positive for       + seasonal allergies         Physical Exam  BP 118/66   Pulse 95   Temp 36.9 C (98.5 F) (Oral)   Ht 1.727 m (5\' 8" )   Wt (!) 148.2 kg (326 lb 11.2 oz)    SpO2 95%   BMI 49.67 kg/m        General: Pleasant male in no acute distress  HEENT: PERRL, EOMI, oropharynx clear, no neck masses, trachea midline, thyroid normal, no adenopathy  Heart: RRR, no murmurs, rubs or gallops  Lungs: Clear to auscultation bilaterally, normal respiratory effort.  Abdomen: Soft, nontender, nondistended, no masses palpable, no hernia, normal liver and spleen, no palpable groin nodes.  Skin: No rashes or induration.  Extremities: Normal gait and station. Hands show no clubbing or cyanosis. 2+ pulses throughout all extremities, no edema.  Breast:Examination of the breast deferred    Assessment:   1. Colon cancer screening          Plan:   Screening colonoscopy.  Standard bowel preparation.    Risks vs. benefits of surgery have been explained to the patient and he/she understands and wishes to proceed.  Also discussed with the patient the likelihood of the patient achieving his or her goals, as well as potential problems that may occur during recuperation.         Rebbeca Paulobert Merica Prell, MD

## 2017-11-12 NOTE — Nursing Note (Signed)
Scheduled c-scope 11-22-2017 at 0830 consent reviewed and obtained, voiced understanding of instructions. Ozzy Bohlken, LPN  1/61/09603/25/2019, 13:32

## 2017-11-21 ENCOUNTER — Encounter (HOSPITAL_COMMUNITY): Payer: Self-pay

## 2017-11-22 ENCOUNTER — Inpatient Hospital Stay
Admission: RE | Admit: 2017-11-22 | Discharge: 2017-11-22 | Disposition: A | Discharge status: Home / Self Care | Payer: Medicare (Managed Care) | Source: Ambulatory Visit | Attending: GENERAL SURGERY | Admitting: GENERAL SURGERY

## 2017-11-22 ENCOUNTER — Encounter (HOSPITAL_COMMUNITY): Payer: Self-pay

## 2017-11-22 ENCOUNTER — Ambulatory Visit (HOSPITAL_COMMUNITY): Payer: Medicare (Managed Care) | Admitting: Certified Registered"

## 2017-11-22 ENCOUNTER — Ambulatory Visit (HOSPITAL_COMMUNITY): Payer: Medicare (Managed Care) | Admitting: GENERAL SURGERY

## 2017-11-22 ENCOUNTER — Encounter (HOSPITAL_COMMUNITY)
Admission: RE | Disposition: A | Discharge status: Home / Self Care | Payer: Self-pay | Source: Ambulatory Visit | Attending: GENERAL SURGERY

## 2017-11-22 DIAGNOSIS — Z7982 Long term (current) use of aspirin: Secondary | ICD-10-CM | POA: Insufficient documentation

## 2017-11-22 DIAGNOSIS — I10 Essential (primary) hypertension: Secondary | ICD-10-CM | POA: Insufficient documentation

## 2017-11-22 DIAGNOSIS — Z1211 Encounter for screening for malignant neoplasm of colon: Secondary | ICD-10-CM | POA: Insufficient documentation

## 2017-11-22 DIAGNOSIS — Z794 Long term (current) use of insulin: Secondary | ICD-10-CM | POA: Insufficient documentation

## 2017-11-22 DIAGNOSIS — Z6841 Body Mass Index (BMI) 40.0 and over, adult: Secondary | ICD-10-CM | POA: Insufficient documentation

## 2017-11-22 DIAGNOSIS — J45990 Exercise induced bronchospasm: Secondary | ICD-10-CM | POA: Insufficient documentation

## 2017-11-22 DIAGNOSIS — G4730 Sleep apnea, unspecified: Secondary | ICD-10-CM | POA: Insufficient documentation

## 2017-11-22 DIAGNOSIS — Z79899 Other long term (current) drug therapy: Secondary | ICD-10-CM | POA: Insufficient documentation

## 2017-11-22 DIAGNOSIS — E119 Type 2 diabetes mellitus without complications: Secondary | ICD-10-CM | POA: Insufficient documentation

## 2017-11-22 HISTORY — PX: COLONOSCOPY: WVUENDOPRO10

## 2017-11-22 HISTORY — DX: Dependence on other enabling machines and devices: Z99.89

## 2017-11-22 LAB — POC BLOOD GLUCOSE (RESULTS): GLUCOSE, POC: 286 mg/dl — ABNORMAL HIGH (ref 70–110)

## 2017-11-22 SURGERY — COLONOSCOPY
Anesthesia: Monitor Anesthesia Care | Wound class: Clean Contaminated Wounds-The respiratory, GI, Genital, or urinary

## 2017-11-22 MED ORDER — LACTATED RINGERS INTRAVENOUS SOLUTION
INTRAVENOUS | Status: DC | PRN
Start: 2017-11-22 — End: 2017-11-22

## 2017-11-22 MED ORDER — LIDOCAINE (PF) 20 MG/ML (2 %) INTRAVENOUS SOLUTION
Freq: Once | INTRAVENOUS | Status: DC | PRN
Start: 2017-11-22 — End: 2017-11-22

## 2017-11-22 MED ORDER — PROPOFOL 10 MG/ML IV BOLUS
INJECTION | Freq: Once | INTRAVENOUS | Status: DC | PRN
Start: 2017-11-22 — End: 2017-11-22
  Administered 2017-11-22: 100 mg via INTRAVENOUS

## 2017-11-22 SURGICAL SUPPLY — 29 items
BASKET SPEC RETR 25MM TWISTER_PLPCTM (INSTRUMENTS)
BRUSH CYTO 1.9MM 150CM BLT TIP WRE SHAFT CLBR STRL DISP 2MM SS PTFE (SURGICAL INSTRUMENTS) IMPLANT
BRUSH CYTOLOGY BRUSH (INSTRUMENTS)
CAN SUCT 800ML PR SPOUT VACU SHUT OFF FILTER HDRPHB 7.25IN 5IN 3 3/8IN (Suction) ×1 IMPLANT
CAN SUCT 800ML PR SPOUT VACU S_HUT OFF FLTR HDRPHB 7.25IN 5IN (Suction) ×1
CLIP HMST RADOPQ STRL LF  DISP RSL 235CM 11MM OPN 2.8MM WRK CHNL (GI LAB SUPPLIES) IMPLANT
DEVICE CAPSULE DEL ADV 2.5MM 180CM GASTROE STRL (ENDOSCOPIC SUPPLIES) IMPLANT
DEVICE CAPSULE DEL ADV 2.5MM 1_80CM GASTROE STRL (INSTRUMENTS ENDOMECHANICAL)
DEVICE HEMOSTASIS CLIP FIXING_M00522610 2.8MM 235CM (GI LAB SUPPLIES)
DEVICE SPEC RETR TWISTER 2.5MM 26MM 3D ROT SHEATH LOOP 230CM PLPCTM (SURGICAL INSTRUMENTS) IMPLANT
DILATOR ENDOS CRE 180CM 8CM 18-19-20MM 6FR ESOPH BAL LOW PROF FIX WRE PEBAX STRL LF  DISP 2.8MM (BALLOON) IMPLANT
DILATOR ENDOS CRE 180CM 8CM 18_-19-20MM 6FR ESOPH BAL LOW (BALLOON)
DISC USE 402689 - ROTH NET 2.5MM X 160CM STD_00711053 BX/5 (Dilators) IMPLANT
ELECTRODE PATIENT RTN 9FT VLAB C30- LB RM PHSV ACRL FOAM CORD NONIRRITATE NONSENSITIZE ADH STRP (CAUTERY SUPPLIES) IMPLANT
ELECTRODE PATIENT RTN 9FT VLAB_REM C30- LB PLHSV ACRL FOAM (CAUTERY SUPPLIES)
FORCEPS ENDOS HOT PRCS BITE 24_0CM 2.8MM 2.2MM RJ 4 CUP DISP (ENDOSCOPIC SUPPLIES) IMPLANT
FORCEPS ENDOS HOT PRCS BITE 24_0CM 2.8MM 2.2MM RJ 4 CUP DISP (INSTRUMENTS ENDOMECHANICAL)
MARKER ENDOS SPOT EX PERM IND DRK SYRG 5ML (GENE) IMPLANT
MARKER ENDOS SPOT EX PREFL PRE_ASSEMBLE SYRG PERM (GENE)
NEEDLE SCLRTX 23GA 2.3MM .24MM_INNER CATH CNRST SHTH STRL (NEEDLES & SYRINGE SUPPLIES)
NEEDLE SCLRTX 25GA 2.3MM .24MM INNER CATH CNRST SHEATH STRL DISP INTJCT 4MM 240CM (NEEDLES & SYRINGE SUPPLIES) IMPLANT
SNARE MED OVAL 240CM 2.4MM SNS LOOP SHRTHRW FLXB ENDOS 2.8MM WRK CHNL PLPCTM 27MM STRL DISP (ENDOSCOPIC SUPPLIES) IMPLANT
SNARE MED OVAL 240CM 2.4MM SNS_LOOP SHRTHRW FLXB ENDOS 2.8MM (INSTRUMENTS ENDOMECHANICAL)
SYRINGE INFLAT ALN II GA STRL DISP 60ML (NEEDLES & SYRINGE SUPPLIES) IMPLANT
SYRINGE INFLAT ALN II GA STRL_DISP 60ML (NEEDLES & SYRINGE SUPPLIES)
TRAP SPEC RETR CLR SUCT PLYP T_RP PLASTIC 4 CHAMBER (Cautery Accessories)
TRAP SPECI REM CLR SUCT PLYP TRP PLASTIC 4 CHAMBER (Cautery Accessories) IMPLANT
TUBING SUCT 9/32IN X 10FT_W/CONN STRL CLEAR (Drains/Resovoirs) ×1
TUBING SUCT CLR 10FT 7MM MEDIVAC MXGR NCDTV MALE CONN STRL LF  DISP (Drains/Resovoirs) ×1 IMPLANT

## 2017-11-22 NOTE — Anesthesia Postprocedure Evaluation (Signed)
Anesthesia Post Op Evaluation    Patient: [name]  Procedure(s):  COLONOSCOPY    Last Vitals:Temperature: 37 C (98.6 F) (11/22/17 0903)  Heart Rate: 75 (11/22/17 0858)  BP (Non-Invasive): (!) 154/77 (11/22/17 0858)  Respiratory Rate: 20 (11/22/17 0858)  SpO2: 95 % (11/22/17 0858)  Patient is sufficiently recovered from the effects of anesthesia to participate in the evaluation and has returned to their pre-procedure level.  Patient location during evaluation: PACU   Post-procedure handoff checklist completed    Patient participation: complete - patient participated  Level of consciousness: awake and alert and responsive to verbal stimuli  Pain score: 0  Pain management: adequate  Airway patency: patent  Anesthetic complications: no  Cardiovascular status: acceptable  Respiratory status: acceptable  Hydration status: acceptable  Patient post-procedure temperature: Pt Normothermic   PONV Status: Absent

## 2017-11-22 NOTE — OR Surgeon (Signed)
Operative Report    Name: [name]  MRN: [id number]  Date of Surgery: 11/22/17  Date of Birth: 11/08/1960    Preoperative diagnosis:  Screening colonoscopy     Postoperative diagnosis:  Endoscopically normal evaluation     Operation: colonoscopy     Surgeon: Rebbeca Paulobert Edwardine Deschepper, MD    Anesthesia: IV Sedation    Indications for Procedure:  Same as above    Findings:  Normal colonoscopy.    Quality of Prep: fair    Colonic Withdrawal Time:  21 minutes 11 seconds    Description of Procedure: The patient was taken to the operating room and placed on the stretcher in the left lateral decubitus position.   Satisfactory IV general anesthesia was induced. Perianal inspection and digital rectal examination were performed next and were unremarkable. The colonoscope was inserted into the rectum and advanced slowly to the cecum, inspecting all four quadrants of the colon on the way in.  The appendiceal orifice and ileocecal valve were clearly visualized. The scope was then slowly withdrawn, again inspecting all four quadrants of the colon on the way out.  No abnormalities were noted throughout the right, transverse, descending colon or rectum.  The scope was retroflexed in the rectum and no abnormalities were noted there.    Air was removed as I finished the procedure.  The patient tolerated the procedure well and was brought to the recovery room in good condition.  Representative photographs were taken for the patient for the medical record.    Rebbeca Paulobert Kier Smead, MD

## 2017-11-22 NOTE — Nurses Notes (Signed)
Discharge instructions provided and reviewed, patient verbalized an understanding, denies any questions or concerns. No distress noted. Up to bathroom, steady on his feet. PWD,  A&O, RR even and unlabored. Denies any pain or nausea. Monitor.

## 2017-11-22 NOTE — Anesthesia Preprocedure Evaluation (Addendum)
ANESTHESIA PRE-OP EVALUATION  Planned Procedure: COLONOSCOPY (N/A )  Review of Systems     anesthesia history negative     patient summary reviewed  nursing notes reviewed        Pulmonary   asthma, sleep apnea, CPAP and rescue inhaler,   Cardiovascular    Hypertension and well controlled No peripheral edema,        GI/Hepatic/Renal   negative GI/hepatic/renal ROS,      Endo/Other    morbid obesity,   type 2 diabetes/ poorly controlled/ controlled with oral medications     Neuro/Psych/MS   negative neuro/psych ROS,      Cancer  negative hematology/oncology ROS,                  Physical Assessment      Patient summary reviewed and Nursing notes reviewed   Airway       Mallampati: III    TM distance: >3 FB    Neck ROM: limited  Mouth Opening: fair.  No Facial hair  No Beard  No endotracheal tube present  No Tracheostomy present    Dental       Dentition intact             Pulmonary    Breath sounds clear to auscultation  (-) no rhonchi, no decreased breath sounds, no wheezes, no rales and no stridor     Cardiovascular    Rhythm: regular  Rate: Normal  (-) no friction rub, carotid bruit is not present, no peripheral edema and no murmur     Other findings            Plan  Planned anesthesia type: MAC    ASA 3     Intravenous induction     Anesthetic plan and risks discussed with patient.     Anesthesia issues/risks discussed are: PONV and Difficult Airway.        Patient's NPO status is appropriate for Anesthesia.                     I acknowledge and agree with the Anesthesia Plan of Care.     Estrella Myrtle, MD

## 2017-11-22 NOTE — Interval H&P Note (Signed)
[facility]  H&P Update Form    [name], Daymen D, 57 y.o. male  Date of Admission:  11/22/2017  Date of Birth:  10/23/1960    11/22/2017         H & P updated the day of the procedure.  1.  H&P completed within 30 days of surgical procedure and has been reviewed within 24 hours of the surgery, the patient has been examined, and no change has occured in the patients condition since the H&P was completed.         Change in medications: No      2.  Patient continues to be appropiate candidate for planned surgical procedure. YES          Rebbeca Paulobert Analynn Daum, MD

## 2017-11-22 NOTE — Nurses Notes (Signed)
Dr Georgian Coiddick at bedside talking to patient. Patient A&O, RR even and unlabored, PWD, denies any pain or nausea. Tolerating oral fluids well. Continue to monitor.

## 2017-11-26 ENCOUNTER — Encounter (INDEPENDENT_AMBULATORY_CARE_PROVIDER_SITE_OTHER): Payer: Self-pay

## 2017-12-11 ENCOUNTER — Ambulatory Visit (INDEPENDENT_AMBULATORY_CARE_PROVIDER_SITE_OTHER): Payer: Self-pay | Admitting: Family

## 2017-12-11 ENCOUNTER — Encounter (INDEPENDENT_AMBULATORY_CARE_PROVIDER_SITE_OTHER): Payer: Self-pay | Admitting: Family

## 2017-12-11 ENCOUNTER — Ambulatory Visit: Payer: Medicare (Managed Care) | Attending: Family | Admitting: Family

## 2017-12-11 VITALS — BP 144/83 | HR 88 | Temp 97.1°F | Ht 68.0 in | Wt 326.0 lb

## 2017-12-11 DIAGNOSIS — R42 Dizziness and giddiness: Secondary | ICD-10-CM

## 2017-12-11 DIAGNOSIS — Z6841 Body Mass Index (BMI) 40.0 and over, adult: Secondary | ICD-10-CM | POA: Insufficient documentation

## 2017-12-11 DIAGNOSIS — R202 Paresthesia of skin: Secondary | ICD-10-CM

## 2017-12-11 DIAGNOSIS — I10 Essential (primary) hypertension: Secondary | ICD-10-CM | POA: Insufficient documentation

## 2017-12-11 DIAGNOSIS — E785 Hyperlipidemia, unspecified: Secondary | ICD-10-CM

## 2017-12-11 DIAGNOSIS — H53483 Generalized contraction of visual field, bilateral: Secondary | ICD-10-CM

## 2017-12-11 DIAGNOSIS — E119 Type 2 diabetes mellitus without complications: Secondary | ICD-10-CM | POA: Insufficient documentation

## 2017-12-11 MED ORDER — OMEPRAZOLE 40 MG CAPSULE,DELAYED RELEASE
40.0000 mg | DELAYED_RELEASE_CAPSULE | Freq: Every day | ORAL | 1 refills | Status: AC
Start: 2017-12-11 — End: 2018-03-11

## 2017-12-11 MED ORDER — INSULIN LISPRO (U-100) 100 UNIT/ML SUBCUTANEOUS PEN
PEN_INJECTOR | SUBCUTANEOUS | 2 refills | Status: DC
Start: 2017-12-11 — End: 2018-10-04

## 2017-12-11 NOTE — Progress Notes (Signed)
[facility]  [address]  [place] [address]    MARKES SHATSWELL  04-17-1961  [id number]    12/13/2017    Chief complaint: ED Follow-up (Pt presents today for full up from ER. C/o headaches, R arm numbness, R neck pain. Reports that he occasionally has tunnel vision and dizziness. )        Subjective:     This is a case of a 57 y.o. year old male who comes in today for follow-up from ER visit at [facility].  He reports that he has been having some headaches, right arm numbness, and also right sided neck pain.  He also says that he has some dizziness with occasional tunnel vision which he describes as "looking at the world through a straw." He had an EKG, CT scan of the head and also labs in the ER.  He was found to have low magnesium and no others at this time.   Pt says that he has not been able to return to work because he has been dizzy and unable to drive.       Constitutional: positive for malaise and fatigue  Eyes: positive for intermittent complaint of tunnel vision  HEENT: negative  Respiratory: negative  Cardiology: negative  GI: negative  Skin Breast: negative  Neuro: positive for headaches, dizziness and paresthesia to the arm.     Psychiatric: negative  Endocrine: positive for diabetes, hyperlipidemia and obesity     Current Outpatient Medications   Medication Sig   . albuterol sulfate (VENTOLIN HFA) 90 mcg/actuation Inhalation HFA Aerosol Inhaler Take 1-2 Puffs by inhalation Every 6 hours as needed   . Aspirin 81 mg Oral Tablet take 81 mg by mouth Once a day.   . dapagliflozin (FARXIGA) 10 mg Oral Tablet Take 10 mg by mouth Once a day   . FLUoxetine (PROZAC) 10 mg Oral Capsule Take 1 Cap (10 mg total) by mouth Once a day for 90 days   . fluticasone (FLONASE) 50 mcg/actuation Nasal Spray, Suspension 1 Spray by Each Nostril route Once a day   . fluticasone-salmeterol (ADVAIR) 250-50 mcg/dose Inhalation Disk with Device oral diskus inhaler Take 1 INHALATION by inhalation Twice  daily for 90 days   . furosemide (LASIX) 40 mg Oral Tablet Take 1 Tab by mouth Once a day   . Glipizide (GLUCOTROL) 10 mg Oral Tablet take 10 mg by mouth Daily before Breakfast. Take 30 minutes before meals    . insulin detemir (LEVEMIR U-100 INSULIN SUBQ) 33 Units by Subcutaneous route Twice daily   . insulin lispro (HUMALOG KWIKPEN INSULIN) 100 unit/mL Subcutaneous Insulin Pen Three times a day with meals per sliding scale, up to 36 units a day4   . Metformin (GLUCOPHAGE) 1,000 mg Oral Tablet Take 1,000 mg by mouth Every morning with breakfast    . metoprolol (LOPRESSOR) 25 mg Oral Tablet take 1 Tab by mouth every 12 hours.   . multivitamin Oral Tablet Take 1 Tab by mouth Once a day   . omeprazole (PRILOSEC) 40 mg Oral Capsule, Delayed Release(E.C.) Take 1 Cap (40 mg total) by mouth Once a day for 90 days   . rosuvastatin (CRESTOR) 20 mg Oral Tablet Take 1 Tab (20 mg total) by mouth Every evening for 90 days   . valsartan-hydroCHLOROthiazide (DIOVAN HCT) 160-25 mg Oral Tablet Take 1 Tab by mouth Once a day for 90 days       Objective:     BP Marland Kitchen)  144/83   Pulse 88   Temp 36.2 C (97.1 F) (Oral)   Ht 1.727 m (5\' 8" )   Wt (!) 147.9 kg (326 lb)   BMI 49.57 kg/m   General appearance: alert, oriented x 3, in his normal state, cooperative, not in apparent distress, appearing stated age   Lungs: clear to auscultation bilaterally   Heart: regular rate and rhythm, S1, S2 normal, no murmur  Abdomen: soft, non-tender. Bowel sounds normal.  Extremities: extremities normal, atraumatic, no cyanosis or edema     Assessment/Plan     ENCOUNTER DIAGNOSES     ICD-10-CM   1. Diabetes mellitus (CMS HCC) E11.9   2. Hyperlipidemia, unspecified hyperlipidemia type E78.5   3. Morbid obesity (CMS HCC) E66.01   4. Essential hypertension I10   5. Vertigo R42   6. Paresthesia R20.2   7. 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 MRA INTRACRANIAL WO CONTRAST   . Comprehensive Metabolic Panel, Non-Fasting   .  Magnesium   . CAROTID ARTERY DUPLEX   . insulin lispro (HUMALOG KWIKPEN INSULIN) 100 unit/mL Subcutaneous Insulin Pen   . omeprazole (PRILOSEC) 40 mg Oral Capsule, Delayed Release(E.C.)       Education  disease management and lifestyle management    The patient was given ample opportunity to ask questions and those questions were answered to the patient's satisfaction. The patient was encouraged to be involved in their own care, and all diagnoses, medications, and medication side-effects were discussed.  A copy of the patient's medication list was printed and given to the patient. A good faith effort was made to reconcile the patient's medications.  The patient was told to contact me with any additional questions or concerns, or go to the ED in an emergency.       Follow up:  in 4 week(s)    [name], APRN-BC

## 2017-12-14 ENCOUNTER — Ambulatory Visit
Admission: RE | Admit: 2017-12-14 | Discharge: 2017-12-14 | Disposition: A | Discharge status: Home / Self Care | Payer: Medicare (Managed Care) | Source: Ambulatory Visit | Attending: Family | Admitting: Family

## 2017-12-14 DIAGNOSIS — R42 Dizziness and giddiness: Secondary | ICD-10-CM | POA: Insufficient documentation

## 2017-12-14 DIAGNOSIS — H53483 Generalized contraction of visual field, bilateral: Secondary | ICD-10-CM | POA: Insufficient documentation

## 2017-12-14 DIAGNOSIS — R202 Paresthesia of skin: Secondary | ICD-10-CM | POA: Insufficient documentation

## 2017-12-17 ENCOUNTER — Other Ambulatory Visit (INDEPENDENT_AMBULATORY_CARE_PROVIDER_SITE_OTHER): Payer: Self-pay | Admitting: Family

## 2017-12-17 MED ORDER — GLIPIZIDE 10 MG TABLET
10.00 mg | ORAL_TABLET | Freq: Every morning | ORAL | 3 refills | Status: DC
Start: 2017-12-17 — End: 2019-01-06

## 2017-12-18 ENCOUNTER — Ambulatory Visit (INDEPENDENT_AMBULATORY_CARE_PROVIDER_SITE_OTHER): Payer: Self-pay | Admitting: Family

## 2017-12-18 ENCOUNTER — Encounter (INDEPENDENT_AMBULATORY_CARE_PROVIDER_SITE_OTHER): Payer: Self-pay | Admitting: Family

## 2017-12-27 ENCOUNTER — Ambulatory Visit
Admission: RE | Admit: 2017-12-27 | Discharge: 2017-12-27 | Disposition: A | Discharge status: Home / Self Care | Payer: Medicare (Managed Care) | Source: Ambulatory Visit | Attending: Family | Admitting: Family

## 2017-12-27 ENCOUNTER — Other Ambulatory Visit (INDEPENDENT_AMBULATORY_CARE_PROVIDER_SITE_OTHER): Payer: Self-pay | Admitting: Family

## 2017-12-27 DIAGNOSIS — H53483 Generalized contraction of visual field, bilateral: Secondary | ICD-10-CM | POA: Insufficient documentation

## 2017-12-27 DIAGNOSIS — R42 Dizziness and giddiness: Secondary | ICD-10-CM | POA: Insufficient documentation

## 2017-12-27 DIAGNOSIS — R202 Paresthesia of skin: Secondary | ICD-10-CM | POA: Insufficient documentation

## 2017-12-28 ENCOUNTER — Ambulatory Visit (INDEPENDENT_AMBULATORY_CARE_PROVIDER_SITE_OTHER): Payer: Self-pay | Admitting: Family

## 2017-12-28 MED ORDER — ALBUTEROL SULFATE HFA 90 MCG/ACTUATION AEROSOL INHALER
1.0000 | INHALATION_SPRAY | Freq: Four times a day (QID) | RESPIRATORY_TRACT | 3 refills | Status: DC | PRN
Start: 2017-12-28 — End: 2018-04-12

## 2017-12-31 ENCOUNTER — Encounter (INDEPENDENT_AMBULATORY_CARE_PROVIDER_SITE_OTHER): Payer: Self-pay | Admitting: Family

## 2017-12-31 ENCOUNTER — Ambulatory Visit: Payer: Medicare (Managed Care) | Attending: Family | Admitting: Family

## 2017-12-31 VITALS — BP 150/89 | HR 85 | Temp 98.5°F | Ht 68.0 in | Wt 323.7 lb

## 2017-12-31 DIAGNOSIS — H53483 Generalized contraction of visual field, bilateral: Secondary | ICD-10-CM

## 2017-12-31 DIAGNOSIS — R42 Dizziness and giddiness: Principal | ICD-10-CM | POA: Insufficient documentation

## 2017-12-31 DIAGNOSIS — M792 Neuralgia and neuritis, unspecified: Secondary | ICD-10-CM | POA: Insufficient documentation

## 2017-12-31 DIAGNOSIS — R202 Paresthesia of skin: Secondary | ICD-10-CM | POA: Insufficient documentation

## 2017-12-31 DIAGNOSIS — E785 Hyperlipidemia, unspecified: Secondary | ICD-10-CM | POA: Insufficient documentation

## 2017-12-31 DIAGNOSIS — E119 Type 2 diabetes mellitus without complications: Secondary | ICD-10-CM | POA: Insufficient documentation

## 2017-12-31 DIAGNOSIS — M542 Cervicalgia: Secondary | ICD-10-CM | POA: Insufficient documentation

## 2017-12-31 DIAGNOSIS — Z6841 Body Mass Index (BMI) 40.0 and over, adult: Secondary | ICD-10-CM | POA: Insufficient documentation

## 2017-12-31 NOTE — Progress Notes (Signed)
[facility] [address] [facility]  [place] [address]  [phone]      Patient Name:  [name]  MRN:  [id number]  DOB:  Aug 16, 1961    Date of Service:  12/31/2017    Chief Complaint:   Chief Complaint   Patient presents with   . Follow Up     Patient presents today for 2 week follow up.       Subjective:  [name] is a 57 y.o. male who returns to the office to follow-up from dizziness, headaches, and right arm tingling. Pt says that he is not driving because he is concerned about the symptoms. Pt says that he is not having improvement in the symptoms and continues to have dizziness.   He also reports that he also has tunnel vision, but only with the dizziness or with "hard coughing"  The tunnel vision lasts "about 15-20 seconds"  He also continues to have discomfort in the right side of the neck and tingling in the right arm.  He denies chest pain or shortness of breath.  Pt says that he is concerned about returning to work because of symptoms.  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      Review of Systems:   See HPI    Current Outpatient Medications   Medication Sig   . albuterol sulfate (VENTOLIN HFA) 90 mcg/actuation Inhalation HFA Aerosol Inhaler Take 1-2 Puffs by inhalation Every 6 hours as needed   . Aspirin 81 mg Oral Tablet take 81 mg by mouth Once a day.   . dapagliflozin (FARXIGA) 10 mg Oral Tablet Take 10 mg by mouth Once a day   . FLUoxetine (PROZAC) 10 mg Oral Capsule Take 1 Cap (10 mg total) by mouth Once a day for 90 days   . fluticasone (FLONASE) 50 mcg/actuation Nasal Spray, Suspension 1 Spray by Each Nostril route Once a day   . fluticasone-salmeterol (ADVAIR) 250-50 mcg/dose Inhalation Disk with Device oral diskus inhaler Take 1 INHALATION by inhalation Twice daily for 90 days   . furosemide (LASIX) 40 mg Oral Tablet Take 1 Tab by mouth Once a day   . glipiZIDE (GLUCOTROL) 10 mg Oral Tablet Take 1 Tab (10 mg total) by mouth Every morning before breakfast  Take 30 minutes before meals   . insulin detemir (LEVEMIR U-100 INSULIN SUBQ) 33 Units by Subcutaneous route Twice daily   . insulin lispro (HUMALOG KWIKPEN INSULIN) 100 unit/mL Subcutaneous Insulin Pen Three times a day with meals per sliding scale, up to 36 units a day4   . Metformin (GLUCOPHAGE) 1,000 mg Oral Tablet Take 1,000 mg by mouth Every morning with breakfast    . metoprolol (LOPRESSOR) 25 mg Oral Tablet take 1 Tab by mouth every 12 hours.   . multivitamin Oral Tablet Take 1 Tab by mouth Once a day   . omeprazole (PRILOSEC) 40 mg Oral Capsule, Delayed Release(E.C.) Take 1 Cap (40 mg total) by mouth Once a day for 90 days   . rosuvastatin (CRESTOR) 20 mg Oral Tablet Take 1 Tab (20 mg total) by mouth Every evening for 90 days   . valsartan-hydroCHLOROthiazide (DIOVAN HCT) 160-25 mg Oral Tablet Take 1 Tab by mouth Once a day for 90 days       Allergies   Allergen Reactions   . Tylox [Oxycodone-Acetaminophen]        Objective:    BP (!) 150/89   Pulse 85   Temp 36.9  C (98.5 F) (Oral)   Ht 1.727 m ('5\' 8"'$ )   Wt (!) 146.8 kg (323 lb 11.2 oz)   BMI 49.22 kg/m     General:  morbidly obese and appears stated age  HENT:  ENMT without erythema or injection, mucous membranes moist.  Neck:  no thyromegaly or lymphadenopathy  Lungs:  Clear to auscultation bilaterally.   Cardiovascular:  regular rate and rhythm  Extremities:  No cyanosis or edema  Skin:  Skin warm and dry  Psychiatric:  Normal  Neuro:  Reflexes and strength to upper ext intact  Data reviewed:  Last CBC  (Last result in the past 2 years)      WBC   HGB   HCT   MCV   Platelets      10/09/17 1106 6.9 16.4 49.7 89.8 262        Last BMP  (Last result in the past 2 years)      Na   K   Cl   CO2   BUN   Cr   Calcium   Glucose   Glucose-Fasting        10/09/17 1106 134 3.9 99 25 16 0.70 8.5 236         Last Hepatic Panel  (Last result in the past 2 years)      Albumin   Total PTN   Total Bili   Direct Bili   Ast/SGOT   Alt/SGPT   Alk Phos        10/09/17  1106 3.8 7.1 0.7   26 41 69         Last Lipid Panel  (Last result in the past 2 years)      Cholesterol   HDL   LDL   Direct LDL   Triglycerides      10/09/17 1106 242 39 126   383        Lab Results   Component Value Date    HA1C 13.1 (H) 10/09/2017    HA1C 10.5 (H) 09/04/2011     Assessment/Plan:    ICD-10-CM    1. Vertigo R42 Refer to Rodney Langton MD     Refer to [facility] [place]   2. Paresthesia R20.2    3. 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 Neck pain M54.2 MRI Cervical Spine WO   5. Diabetes mellitus (CMS HCC) E11.9 Comprehensive Metabolic Panel (Fasting)     Lipid Panel     Hemoglobin A1C   6. Hyperlipidemia, unspecified hyperlipidemia type E78.5 Comprehensive Metabolic Panel (Fasting)     Lipid Panel     Hemoglobin A1C   7. Morbid obesity (CMS HCC) E66.01    8. Radicular pain in right arm M79.2 MRI Cervical Spine WO       Orders Placed This Encounter   . MRI Cervical Spine WO   . Comprehensive Metabolic Panel (Fasting)   . Lipid Panel   . Hemoglobin A1C   . Refer to Rodney Langton MD   . Refer to [facility]NT/Allergy [place]       Diet Recommended: Healthy Eating - low fat, low simple carbohydrate.  Lots of fresh fruit & vegetables and whole grains.  Lean Meats    Follow up:  Return in about 1 month (around 01/28/2018).      Marveen Reeks, APRN  12/31/2017, 10:15

## 2018-01-02 ENCOUNTER — Ambulatory Visit (INDEPENDENT_AMBULATORY_CARE_PROVIDER_SITE_OTHER): Payer: Self-pay | Admitting: Family

## 2018-01-08 ENCOUNTER — Ambulatory Visit (INDEPENDENT_AMBULATORY_CARE_PROVIDER_SITE_OTHER): Payer: Self-pay | Admitting: Family

## 2018-01-11 ENCOUNTER — Other Ambulatory Visit (HOSPITAL_COMMUNITY): Payer: Self-pay | Admitting: MRI

## 2018-01-17 ENCOUNTER — Telehealth (INDEPENDENT_AMBULATORY_CARE_PROVIDER_SITE_OTHER): Payer: Self-pay | Admitting: NURSE PRACTITIONER

## 2018-01-17 ENCOUNTER — Encounter (INDEPENDENT_AMBULATORY_CARE_PROVIDER_SITE_OTHER): Payer: Self-pay | Admitting: Family

## 2018-01-17 MED ORDER — NAPROXEN 500 MG TABLET
500.00 mg | ORAL_TABLET | Freq: Two times a day (BID) | ORAL | 1 refills | Status: DC
Start: 2018-01-17 — End: 2018-05-08

## 2018-01-17 NOTE — Telephone Encounter (Signed)
-----   Message from Despina Arias, LPN sent at 11/27/8117  1:40 PM EDT -----  Regarding: FW: Prescription Question  Contact: [phone]      ----- Message -----  From: Jory Ee  Sent: 01/17/2018   1:21 PM  To: [facility] [address] Nurse Triage  Subject: Prescription Question                            I need a refil of my Naprosyn (Naproxen) . Did not see it added to my chart. Thank you

## 2018-01-18 ENCOUNTER — Ambulatory Visit
Admission: RE | Admit: 2018-01-18 | Discharge: 2018-01-18 | Disposition: A | Discharge status: Home / Self Care | Payer: Medicare (Managed Care) | Source: Ambulatory Visit | Admitting: MRI

## 2018-01-18 DIAGNOSIS — M792 Neuralgia and neuritis, unspecified: Secondary | ICD-10-CM

## 2018-01-18 DIAGNOSIS — M542 Cervicalgia: Secondary | ICD-10-CM

## 2018-01-22 ENCOUNTER — Other Ambulatory Visit (INDEPENDENT_AMBULATORY_CARE_PROVIDER_SITE_OTHER): Payer: Self-pay | Admitting: Family

## 2018-01-22 ENCOUNTER — Encounter (INDEPENDENT_AMBULATORY_CARE_PROVIDER_SITE_OTHER): Payer: Self-pay

## 2018-01-24 ENCOUNTER — Ambulatory Visit (INDEPENDENT_AMBULATORY_CARE_PROVIDER_SITE_OTHER): Payer: Medicare (Managed Care)

## 2018-01-24 ENCOUNTER — Encounter (INDEPENDENT_AMBULATORY_CARE_PROVIDER_SITE_OTHER): Payer: Self-pay

## 2018-01-24 DIAGNOSIS — R42 Dizziness and giddiness: Secondary | ICD-10-CM

## 2018-01-24 DIAGNOSIS — Z6841 Body Mass Index (BMI) 40.0 and over, adult: Secondary | ICD-10-CM

## 2018-01-24 NOTE — H&P (Signed)
[facility]  [address]  [place] [id number]  [phone]      Date: 01/24/2018  Name: Dean Eelan D Goldberger  Age: 57 y.o.  DOB:  07/04/1961    Chief Complaint: Establish Care; Balance Problems; and Facial Pressure      History of Present Illness:     [name] is a 57 y.o. male who presents today for vertigo.  Onset was in April.  Patient states he has had 15-20 spells of light headedness associated with tunnel vision and feeling as if he is going to faint. He does not have any spinning with this. This responds to sitting or laying down for a few minutes and activity does not seem to be an associated factor. Along with the first episode of light headedness, he had a pop in the right neck that was associated with pain.   He has had 3 episodes of popping with head turning that is associated with feeling light headed. He has a constant pain in the lateral right neck, posterior base of the skull, and some frontal pain. His hearing is normal and denies tinnitus and spinning.  The patient is not had vertigo, just lightheadedness and feeling like he is going to faint associated with the his vision going dark.    Past Medical History:     Past Medical History:   Diagnosis Date   . Ankle injury    . Cellulitis 2013   . CPAP (continuous positive airway pressure) dependence    . Diabetes    . HTN    . Knee injury    . Morbid obesity (CMS HCC)    . Nerve damage    . Sleep apnea          Past Surgical History:   Procedure Laterality Date   . COLONOSCOPY  11/22/2017    Dr Georgian Coiddick   . HX HERNIA REPAIR      Bilateral   . HX HERNIA REPAIR     . HX HERNIA REPAIR      inguinal and umbilical repair    . HX ORCHIOPEXY     . HX TONSILLECTOMY     . HX WISDOM TEETH EXTRACTION       Current Outpatient Medications   Medication Sig   . albuterol sulfate (VENTOLIN HFA) 90 mcg/actuation Inhalation HFA Aerosol Inhaler Take 1-2 Puffs by inhalation Every 6 hours as needed   . Aspirin 81 mg Oral Tablet take 81 mg by mouth  Once a day.   . dapagliflozin (FARXIGA) 10 mg Oral Tablet Take 10 mg by mouth Once a day   . FLUoxetine (PROZAC) 10 mg Oral Capsule Take 1 Cap (10 mg total) by mouth Once a day for 90 days   . fluticasone (FLONASE) 50 mcg/actuation Nasal Spray, Suspension 1 Spray by Each Nostril route Once a day   . fluticasone-salmeterol (ADVAIR) 250-50 mcg/dose Inhalation Disk with Device oral diskus inhaler Take 1 INHALATION by inhalation Twice daily for 90 days   . furosemide (LASIX) 40 mg Oral Tablet Take 1 Tab by mouth Once a day   . glipiZIDE (GLUCOTROL) 10 mg Oral Tablet Take 1 Tab (10 mg total) by mouth Every morning before breakfast Take 30 minutes before meals   . insulin detemir (LEVEMIR U-100 INSULIN SUBQ) 33 Units by Subcutaneous route Twice daily   . insulin lispro (HUMALOG KWIKPEN INSULIN) 100 unit/mL Subcutaneous Insulin Pen Three times a day with meals per sliding scale, up to  36 units a day4   . Metformin (GLUCOPHAGE) 1,000 mg Oral Tablet Take 1,000 mg by mouth Every morning with breakfast    . metoprolol (LOPRESSOR) 25 mg Oral Tablet take 1 Tab by mouth every 12 hours.   . multivitamin Oral Tablet Take 1 Tab by mouth Once a day   . naproxen (NAPROSYN) 500 mg Oral Tablet Take 1 Tab (500 mg total) by mouth Twice daily with food   . omeprazole (PRILOSEC) 40 mg Oral Capsule, Delayed Release(E.C.) Take 1 Cap (40 mg total) by mouth Once a day for 90 days   . rosuvastatin (CRESTOR) 20 mg Oral Tablet Take 1 Tab (20 mg total) by mouth Every evening for 90 days   . valsartan-hydroCHLOROthiazide (DIOVAN HCT) 160-25 mg Oral Tablet Take 1 Tab by mouth Once a day for 90 days     Allergies   Allergen Reactions   . Tylox [Oxycodone-Acetaminophen]      Family Medical History:     Problem Relation (Age of Onset)    Cancer Mother    Congestive Heart Failure Maternal Grandmother    Diabetes Paternal Uncle        Social History     Tobacco Use   . Smoking status: Never Smoker   . Smokeless tobacco: Never Used   Substance Use Topics      . Alcohol use: No        Review of Systems:     REVIEW OF SYSTEMS:  CONSTITUTIONAL: Patient denies any fatigue, weight loss, fever or chills.  EYES: Patient denies any double vision, blurred vision or loss of vision.  ENT: Patient denies any hearing loss, ear drainage, ear pain, noise in ears, dizziness, nasal bleeding, nasal discharge, sneezing, snoring, mouth sores, loss of taste, frequent sore throats, laryngitis or difficulty swallowing.   CARDIOVASCULAR: Patient denies any heart racing.  RESPIRATORY: Patient denies any shortness of breath, noisy breathing, wheezing asthma or cough.   NEUROLOGICAL: Patient denies any numbness, tingling, seizures or headaches.  GI: Patient denies any nausea, vomiting, indigestion, or heartburn.   GU: Patient denies any increased urinary frequency or painful urination.   ENDOCRINE: Patient denies having any brittle hair, hot or cold flashes.   SKIN: Patient denies any rashes or lesions.   MUSCULOSKELETAL: Patient denies any muscle aches or joint aches.  HEM/LYMPH: Patient denies any bleeding or easy bruising.  ALLERGIC/IMM: Patient denies any itchy eyes, ears, nose or palate, watery eyes, scratchy throat, or sneezing excessively.  PSYCHIATRIC: Patient denies any anxiety or depression.    Physical Examination:     Resp 18   Ht 1.727 m (5\' 8" )   Wt (!) 144.7 kg (319 lb)   BMI 48.50 kg/m     GENERAL:  Patient is in no acute distress.  HEAD:  Head is normocephalic, atraumatic. No palpable salivary gland masses.  FACE:  Face is symmetric, cranial nerve 7 is intact bilaterally.  EYES:  Sclera non-icteric  EXTERNAL EARS:  Normal pinnae shape and position  EXTERNAL AUDITORY CANAL:  LEFT - Patent, no evidence of inflammation  TYMPANIC MEMBRANE:  LEFT - Intact, healthy appearing and no evidence of middle ear effusion  EXTERNAL AUDITORY CANAL:  RIGHT - Patent, no evidence of inflammation  TYMPANIC MEMBRANE:  RIGHT - Intact, healthy appearing and no evidence of middle ear  effusion  NOSE:  Deflection to the right  ORAL CAVITY:  Healthy appearing lips, tongue and gums  OROPHARYNX:  Clear, no masses seen  HYPOPHARYNX:  Deferred  NECK:  Trachea is midline  THYROID:  no significant thyroid abnormality by palpation  LYMPH:  No cervical lymphadenopathy is palpable  NEURO:  Tremors - absent   SKIN:  Skin is warm and dry to touch.  RESPIRATORY:  No stridor.  CARDIOVASCULAR:  No peripheral cyanosis is noted.  MUSCULOSKELETAL:  Extremities move equally well.  PSYCHIATRIC:  Patient is pleasant, cooperative and alert.    Procedure:     None    Data Reviewed:         Assessment and Plan:     Koehn was seen today for establish care, balance problems and facial pressure.    Diagnoses and all orders for this visit:    Vertigo  -     Refer to [facility] [place]     this gentleman is not having vertigo as much as he is experiencing lightheadedness.  He has a MRI of his cervical spine scheduled for this week and has an appointment with neuro surgery on Monday.  The pain that he is having in his neck now is musculoskeletal in origin.  I do not feel that his problem is of an ear nose and throat nature.  We will await the results of the neuro surgery evaluation.  Will see him back on as-needed basis.    Plan for a return to clinic as needed, sooner should there be problems.     I am scribing for, and in the presence of, Jodi Marble, PA-C, for services provided on 01/24/2018.    Destini Long, MA  01/24/2018, 08:52    I have reviewed and confirmed the ROS, PFSH, and all other elements documented by the SCRIBE. The scribed portion of the progress note was scribed on my behalf and at my direction. I have reviewed and attest to the accuracy of the note.    Jodi Marble, PA-C  01/24/2018, 08:52    I have reviewed the H&P/ Findings/ Assessment/ Plan of the PA/ Resident/ Student/ NP & agree with the said documentation.    Shelah Lewandowsky, MD 01/24/2018, 15:43

## 2018-01-25 ENCOUNTER — Other Ambulatory Visit (INDEPENDENT_AMBULATORY_CARE_PROVIDER_SITE_OTHER): Payer: Self-pay | Admitting: Family

## 2018-01-25 ENCOUNTER — Ambulatory Visit
Admission: RE | Admit: 2018-01-25 | Discharge: 2018-01-25 | Disposition: A | Discharge status: Home / Self Care | Payer: Medicare (Managed Care) | Source: Ambulatory Visit | Attending: Family | Admitting: Family

## 2018-01-25 ENCOUNTER — Ambulatory Visit (HOSPITAL_COMMUNITY): Admission: RE | Admit: 2018-01-25 | Payer: Medicare (Managed Care) | Source: Ambulatory Visit | Admitting: MRI

## 2018-01-25 DIAGNOSIS — M542 Cervicalgia: Secondary | ICD-10-CM

## 2018-01-28 ENCOUNTER — Other Ambulatory Visit (INDEPENDENT_AMBULATORY_CARE_PROVIDER_SITE_OTHER): Payer: Self-pay | Admitting: Family

## 2018-01-28 ENCOUNTER — Encounter: Payer: Self-pay | Admitting: Specialist

## 2018-01-28 ENCOUNTER — Ambulatory Visit: Payer: Medicare (Managed Care) | Admitting: Specialist

## 2018-01-28 VITALS — BP 150/82 | Ht 68.0 in | Wt 319.0 lb

## 2018-01-28 DIAGNOSIS — M5412 Radiculopathy, cervical region: Secondary | ICD-10-CM

## 2018-01-28 DIAGNOSIS — Z8679 Personal history of other diseases of the circulatory system: Secondary | ICD-10-CM

## 2018-01-28 DIAGNOSIS — R202 Paresthesia of skin: Secondary | ICD-10-CM

## 2018-01-28 DIAGNOSIS — G4733 Obstructive sleep apnea (adult) (pediatric): Secondary | ICD-10-CM

## 2018-01-28 DIAGNOSIS — R42 Dizziness and giddiness: Secondary | ICD-10-CM

## 2018-01-28 DIAGNOSIS — R55 Syncope and collapse: Principal | ICD-10-CM

## 2018-01-28 DIAGNOSIS — H53483 Generalized contraction of visual field, bilateral: Secondary | ICD-10-CM

## 2018-01-28 NOTE — Progress Notes (Signed)
[facility] Tyton Abdallah MD  [address]  [name] [address]     Consultation    Name: [name] MRN:  [id number]   Date: 01/28/2018 Age: 57 y.o.     Referring Provider:  [name]  Primary Care Provider: [name], APRN       Chief Complaint: Dizziness and Vision Problems    History of Present Illness   [name] is a 57 y.o. year old male who comes to clinic with history of dizziness which began on April 8th of this year.  He gets a lightheaded feeling along with some pain involving the right side of his neck.  The lightheaded feeling usually resolves in 2 or 3 minutes.  Reportedly has some tunneling of vision. These episodes may occur 2 or 3 times a day and has occurred when watching TV, standing and even when he was seated.  It has however never occurred when he was in a supine position.  He has had tingling/numb feeling involving his right arm on a continuous basis since April.  He has been evaluated by ENT.  He denied nausea or vomiting with his episodes.  He has no hearing loss or tinnitus.  He denied cognitive or other bulbar complaints.  He has history of sleep apnea of at least 5 years duration and is on CPAP.  He goes to bed at about 11 and gets up by 6.  He has some mild restlessness of his legs.  He gets go charley horses and "in his legs.      Past Medical History:   Diagnosis Date   . Ankle injury    . Cellulitis 2013   . CPAP (continuous positive airway pressure) dependence    . Diabetes    . HTN    . Knee injury    . Morbid obesity (CMS HCC)    . Nerve damage    . Sleep apnea          Past Surgical History:   Procedure Laterality Date   . COLONOSCOPY  11/22/2017    Dr Georgian Co   . HX CATARACT REMOVAL     . HX HERNIA REPAIR      Bilateral   . HX HERNIA REPAIR     . HX HERNIA REPAIR      inguinal and umbilical repair    . HX ORCHIOPEXY     . HX TONSILLECTOMY     . HX WISDOM TEETH EXTRACTION           Current Outpatient Medications   Medication Sig   . albuterol sulfate (VENTOLIN  HFA) 90 mcg/actuation Inhalation HFA Aerosol Inhaler Take 1-2 Puffs by inhalation Every 6 hours as needed   . Aspirin 81 mg Oral Tablet take 81 mg by mouth Once a day.   . dapagliflozin (FARXIGA) 10 mg Oral Tablet Take 10 mg by mouth Once a day   . FLUoxetine (PROZAC) 10 mg Oral Capsule Take 1 Cap (10 mg total) by mouth Once a day for 90 days   . fluticasone (FLONASE) 50 mcg/actuation Nasal Spray, Suspension 1 Spray by Each Nostril route Once a day (Patient not taking: Reported on 01/28/2018)   . fluticasone-salmeterol (ADVAIR) 250-50 mcg/dose Inhalation Disk with Device oral diskus inhaler Take 1 INHALATION by inhalation Twice daily for 90 days   . furosemide (LASIX) 40 mg Oral Tablet Take 1 Tab by mouth Once a day   . glipiZIDE (GLUCOTROL) 10 mg Oral Tablet Take  1 Tab (10 mg total) by mouth Every morning before breakfast Take 30 minutes before meals   . insulin detemir (LEVEMIR U-100 INSULIN SUBQ) 33 Units by Subcutaneous route Twice daily   . insulin lispro (HUMALOG KWIKPEN INSULIN) 100 unit/mL Subcutaneous Insulin Pen Three times a day with meals per sliding scale, up to 36 units a day4 (Patient not taking: Reported on 01/28/2018)   . Metformin (GLUCOPHAGE) 1,000 mg Oral Tablet Take 1,000 mg by mouth Every morning with breakfast    . metoprolol (LOPRESSOR) 25 mg Oral Tablet take 1 Tab by mouth every 12 hours.   . multivitamin Oral Tablet Take 1 Tab by mouth Once a day   . naproxen (NAPROSYN) 500 mg Oral Tablet Take 1 Tab (500 mg total) by mouth Twice daily with food   . omeprazole (PRILOSEC) 40 mg Oral Capsule, Delayed Release(E.C.) Take 1 Cap (40 mg total) by mouth Once a day for 90 days   . rosuvastatin (CRESTOR) 20 mg Oral Tablet Take 1 Tab (20 mg total) by mouth Every evening for 90 days   . valsartan-hydroCHLOROthiazide (DIOVAN HCT) 160-25 mg Oral Tablet Take 1 Tab by mouth Once a day for 90 days     Allergies   Allergen Reactions   . Tylox [Oxycodone-Acetaminophen]      Family Medical History:     Problem  Relation (Age of Onset)    Cancer Mother    Congestive Heart Failure Maternal Grandmother    Diabetes Paternal Uncle            Social History     Tobacco Use   . Smoking status: Never Smoker   . Smokeless tobacco: Never Used   Substance Use Topics   . Alcohol use: No      Review of Systems  Patient has no chest pain, shortness of breath, abdominal pain, weight loss, fever or new rash.  Rest of the review of systems negative unless otherwise indicated elsewhere in the consult.    Examination:  Ht 1.727 m (5\' 8" )   Wt (!) 144.7 kg (319 lb)   BMI 48.50 kg/m   Standing blood pressure was 160/80.  He was morbidly obese.  Airway class was 4.  Neck circumference was 20-1/2 inches.  He had a fairly large tongue.      Eyes:  Disc margins were sharp. There was no papilledema.    Cardiovascular:  No cardiac murmur.  No carotid bruit.     Neurology:    Mental status:  Alert and oriented.  Attention, concentration, and language functions were normal.  Recent and remote memory were within normal limits.    Cranial nerves:  Extraocular movements and visual fields were full. Facial sensation and strength were normal. Hearing was intact. Tongue and palate were in the midline. Strength of sternocleidomastoids was normal. All cranial nerve functions were normal.    Motor:  Normal strength and tone were normal in upper and lower extremities.    Sensation:  Had some patchy hypalgesia of his right arm    Reflexes:  Hypoactive Plantar responses were flexor.    Cerebrallar functions:  Normal.  Finger to nose testing was normal.    Gait and station:  Normal      Musculoskeletal:  Neck was supple.  Sitting straight-leg raising test was negative.            Data reviewed:  2019 WBC 6.9, hemoglobin 16.4 Chem profile normal, total cholesterol 242, HDL 39, LDL 126, triglycerides 383,  hemoglobin A1c 13.1, MRA of brain and circle of Willis normal, cervical MRI showed degenerative changes at C6-C7, carotid ultrasound showed less than 50%  stenosis of left common carotid  Assessment and Recommendations  Problem List Items Addressed This Visit     None      Visit Diagnoses     Postural dizziness with presyncope    -  Primary    Relevant Orders    MRI BRAIN WO CONTRAST    Vertigo        Relevant Orders    EEG (AWAKE/DROWSY) AMB ONLY    Tunnel vision, bilateral        Relevant Orders    MRI BRAIN WO CONTRAST    History of hypertension        Cervical radiculopathy        Relevant Orders    EMG (NCS STUDIES)-IN CLINIC ONLY-AMB    VITAMIN B12    FOLATE    THYROID STIMULATING HORMONE (SENSITIVE TSH)    Obstructive sleep apnea syndrome in adult            Plan:    1. Lightheaded feeling with tunneling of vision is probably from orthostatic hypotension although this was not demonstrated during this examination.  Additionally it is felt that this may be occurring in the context of underlying hypertension.    2.  He will be evaluated further with a awake electroencephalogram    3. Cranial MRI will be obtained.    4. Paresthesias of his right arm will be evaluated with electromyography.    5. He has sleep apnea and has been using CPAP.    6.Sleep precautions, including but not restricted to those listed below were discussed with the patient.   A. Patient was advised to avoid potentially dangerous activities and activities requiring full alertness while, if drowsy.  B. Patient is not to drive or operate heavy machinery, if drowsy.   C. Patient was advised that sedating medications and alcohol may worsen the symptoms of drowsiness and inattentiveness.   D. Patient was advised about diet and other measures related to weight management.   E. Consequence of untreated sleep apnea including cardiac failure, hypertension etc. Discussed.    7. Precautions related to syncope were also discussed with him.    8. Cervical MRI images were reviewed with patient. Down the line in put of [facility] will be considered.    9.At least 50% of the time was spent counseling patient  .          Dorthula Bier Elmer BalesUchila Jabes Primo, MD             SN/kc

## 2018-01-29 ENCOUNTER — Ambulatory Visit: Payer: Medicare (Managed Care) | Attending: Family | Admitting: Family

## 2018-01-29 ENCOUNTER — Ambulatory Visit (INDEPENDENT_AMBULATORY_CARE_PROVIDER_SITE_OTHER): Payer: Medicare (Managed Care)

## 2018-01-29 ENCOUNTER — Ambulatory Visit (INDEPENDENT_AMBULATORY_CARE_PROVIDER_SITE_OTHER): Payer: Self-pay | Admitting: Family

## 2018-01-29 ENCOUNTER — Encounter (INDEPENDENT_AMBULATORY_CARE_PROVIDER_SITE_OTHER): Payer: Self-pay | Admitting: Family

## 2018-01-29 DIAGNOSIS — E119 Type 2 diabetes mellitus without complications: Secondary | ICD-10-CM | POA: Insufficient documentation

## 2018-01-29 DIAGNOSIS — E785 Hyperlipidemia, unspecified: Secondary | ICD-10-CM

## 2018-01-29 DIAGNOSIS — Z6841 Body Mass Index (BMI) 40.0 and over, adult: Secondary | ICD-10-CM | POA: Insufficient documentation

## 2018-01-29 DIAGNOSIS — M5412 Radiculopathy, cervical region: Secondary | ICD-10-CM | POA: Insufficient documentation

## 2018-01-29 LAB — HGA1C (HEMOGLOBIN A1C WITH EST AVG GLUCOSE)
ESTIMATED AVERAGE GLUCOSE: 301 mg/dL
HEMOGLOBIN A1C: 12.1 % — ABNORMAL HIGH (ref 4.3–6.1)

## 2018-01-29 LAB — THYROID STIMULATING HORMONE (SENSITIVE TSH): TSH: 1.676 uIU/mL (ref 0.340–5.600)

## 2018-01-29 LAB — FOLATE: FOLATE: 20.8 ng/mL

## 2018-01-29 LAB — VITAMIN B12: VITAMIN B 12: 551 pg/mL

## 2018-01-29 NOTE — Telephone Encounter (Signed)
Referral faxed to [facility] in [place].  They will call patient with date and time of appointment.

## 2018-01-29 NOTE — Progress Notes (Addendum)
[facility]  [address]  [place] [id number]    Jory Eelan D Grandmaison  11/25/1960  [id number]    02/07/2018    Chief complaint: Follow Up (Patient presents today for 1 month follow up)        Subjective:     This is a case of a 57 y.o. year old male who comes in today for follow-up from ongoing issues with dizziness, neck pain, and tunnel vision.  He was seen by Neurology who felt that the symptoms may be caused by drop in blood pressure so he has referred patient to cardiology.  Neurology feels that patient should remain off work until cleared by cardiology at upcoming appt.    Pt also has upcoming MRI because neurology feels that patient has had "little strokes" in the past.    Pt says that he is off work through the end of June per Dr Eino FarberNavada.  Pt says that he continues not to drive.  Also, he has been stress eating and has had weight gain since last office visit.       Constitutional: negative  Respiratory: negative  Cardiology: negative  Skin Breast: negative  Psychiatric: negative  Endocrine: positive for diabetes, hyperlipidemia, obesity  and thyroid disease    Current Outpatient Medications   Medication Sig   . albuterol sulfate (VENTOLIN HFA) 90 mcg/actuation Inhalation HFA Aerosol Inhaler Take 1-2 Puffs by inhalation Every 6 hours as needed   . Aspirin 81 mg Oral Tablet take 81 mg by mouth Once a day.   . dapagliflozin (FARXIGA) 10 mg Oral Tablet Take 10 mg by mouth Once a day   . FLUoxetine (PROZAC) 10 mg Oral Capsule Take 1 Cap (10 mg total) by mouth Once a day for 90 days   . fluticasone (FLONASE) 50 mcg/actuation Nasal Spray, Suspension 1 Spray by Each Nostril route Once a day   . fluticasone-salmeterol (ADVAIR) 250-50 mcg/dose Inhalation Disk with Device oral diskus inhaler Take 1 INHALATION by inhalation Twice daily for 90 days   . furosemide (LASIX) 40 mg Oral Tablet Take 1 Tab (40 mg total) by mouth Once a day for 90 days   . glipiZIDE (GLUCOTROL) 10 mg Oral Tablet Take 1 Tab (10 mg  total) by mouth Every morning before breakfast Take 30 minutes before meals   . insulin detemir (LEVEMIR U-100 INSULIN SUBQ) 33 Units by Subcutaneous route Twice daily   . insulin lispro (HUMALOG KWIKPEN INSULIN) 100 unit/mL Subcutaneous Insulin Pen Three times a day with meals per sliding scale, up to 36 units a day4   . Metformin (GLUCOPHAGE) 1,000 mg Oral Tablet Take 1,000 mg by mouth Every morning with breakfast    . metoprolol (LOPRESSOR) 25 mg Oral Tablet take 1 Tab by mouth every 12 hours.   . multivitamin Oral Tablet Take 1 Tab by mouth Once a day   . naproxen (NAPROSYN) 500 mg Oral Tablet Take 1 Tab (500 mg total) by mouth Twice daily with food   . omeprazole (PRILOSEC) 40 mg Oral Capsule, Delayed Release(E.C.) Take 1 Cap (40 mg total) by mouth Once a day for 90 days   . rosuvastatin (CRESTOR) 20 mg Oral Tablet Take 1 Tab (20 mg total) by mouth Every evening for 90 days   . valsartan-hydroCHLOROthiazide (DIOVAN HCT) 160-25 mg Oral Tablet Take 1 Tab by mouth Once a day for 90 days       Objective:     BP (!) 157/88   Pulse 77  Temp 36.6 C (97.8 F) (Oral)   Ht 1.727 m (5\' 8" )   Wt (!) 148.8 kg (328 lb)   BMI 49.87 kg/m       General appearance: alert, oriented x 3, in his normal state, cooperative, not in apparent distress, appearing stated age   Lungs: clear to auscultation bilaterally   Heart: regular rate and rhythm, S1, S2 normal, no murmur  Abdomen: soft, non-tender. Bowel sounds normal.  Extremities: extremities normal, atraumatic, no cyanosis or edema     Assessment/Plan     ENCOUNTER DIAGNOSES     ICD-10-CM   1. Cervical radiculopathy M54.12   2. Diabetes mellitus (CMS HCC) E11.9   3. Hyperlipidemia, unspecified hyperlipidemia type E78.5     Off work until 04/02/2018 cardiology appt.  If cardiology does not wish him to return to work, they need to complete paperwork to be off.     Education  Medication safety, disease management and lifestyle management    The patient was given ample  opportunity to ask questions and those questions were answered to the patient's satisfaction. The patient was encouraged to be involved in their own care, and all diagnoses, medications, and medication side-effects were discussed.  A copy of the patient's medication list was printed and given to the patient. A good faith effort was made to reconcile the patient's medications.  The patient was told to contact me with any additional questions or concerns, or go to the ED in an emergency.       Follow up:  in 4 week(s)    [name], APRN-BC

## 2018-01-31 ENCOUNTER — Other Ambulatory Visit (INDEPENDENT_AMBULATORY_CARE_PROVIDER_SITE_OTHER): Payer: Self-pay | Admitting: Family

## 2018-01-31 ENCOUNTER — Encounter (INDEPENDENT_AMBULATORY_CARE_PROVIDER_SITE_OTHER): Payer: Self-pay | Admitting: Family

## 2018-02-01 MED ORDER — FUROSEMIDE 40 MG TABLET
40.0000 mg | ORAL_TABLET | Freq: Every day | ORAL | 1 refills | Status: DC
Start: 2018-02-01 — End: 2018-03-04

## 2018-02-04 MED ORDER — ROSUVASTATIN 20 MG TABLET
20.00 mg | ORAL_TABLET | Freq: Every evening | ORAL | 1 refills | Status: DC
Start: 2018-02-04 — End: 2018-08-12

## 2018-02-05 ENCOUNTER — Other Ambulatory Visit: Payer: Medicare (Managed Care) | Admitting: Specialist

## 2018-02-12 ENCOUNTER — Encounter (INDEPENDENT_AMBULATORY_CARE_PROVIDER_SITE_OTHER): Payer: Self-pay | Admitting: Family

## 2018-02-12 DIAGNOSIS — R42 Dizziness and giddiness: Secondary | ICD-10-CM

## 2018-02-12 DIAGNOSIS — I10 Essential (primary) hypertension: Secondary | ICD-10-CM

## 2018-02-12 NOTE — Telephone Encounter (Signed)
Per last office note, Dr. Eino FarberNavada was to refer pt to cardiology. Could not find any orders in chart. Referral to cardiology entered in to chart.

## 2018-02-12 NOTE — Telephone Encounter (Signed)
-----   Message from Freescale Semiconductorlan D. Genis sent at 02/12/2018  8:51 AM EDT -----  Regarding: Referral Question  Contact: [phone]  Good morning,    I have not received an appointment with the local cardiologist and was wondering if has been schedule and I was not notified?    Respectfully

## 2018-02-14 ENCOUNTER — Encounter: Payer: Self-pay | Admitting: Specialist

## 2018-02-18 ENCOUNTER — Telehealth (INDEPENDENT_AMBULATORY_CARE_PROVIDER_SITE_OTHER): Payer: Self-pay | Admitting: Family

## 2018-02-18 ENCOUNTER — Encounter (INDEPENDENT_AMBULATORY_CARE_PROVIDER_SITE_OTHER): Payer: Self-pay | Admitting: Family

## 2018-02-18 NOTE — Telephone Encounter (Signed)
-----   Message from Cheryll DessertAmy Coffman, APRN sent at 02/18/2018 12:01 PM EDT -----  Regarding: FW: Non-Urgent Medical Question  Contact: [phone]  All records are completed on my end.  Please give him the number to medical records (or ask Joni if we do this in this office).  He can have extension until seen by cardiology.  Please have him send needed paperwork if necessary  ----- Message -----  From: Leonia Coronahorne, Teena, LPN  Sent: 0/9/81197/08/2017  10:22 AM  To: Cheryll DessertAmy Coffman, APRN  Subject: FW: Non-Urgent Medical Question                      ----- Message -----  From: Jory EeAlan D Krahenbuhl  Sent: 02/18/2018   9:49 AM  To: [facility] [facility] Nurse T[facility]  Subject: Non-Urgent Medical Question                      Good morning.    I have been on the phone with my Short Term Disability insurance company and my work. I am having an issue of getting my medical records sent to the insurance company to continue my benefits. Is there anything you can do to get those sent out ASAP?    Also, the insurance copy is wondering if we could extend my time off until after my appointment with the Cardiologist? Because of the issue of the return time on the medical records, they are wanting to cover me for a longer time frame to avoid these interruptions in my checks being dispersed. I have gone, one month now without a check and my family is suffering greatly.    Should I be released back to work sooner, they will need information of that occuring and the effective date, at which time my claim will end. If you have any questions, please call me at [phone].    Thank you.

## 2018-02-18 NOTE — Telephone Encounter (Signed)
Per pt, in order to be granted an extension, the return to work date needs to be addend in the last office note. Pt states that he sees cardiology on 04/02/18.

## 2018-02-19 ENCOUNTER — Ambulatory Visit
Admission: RE | Admit: 2018-02-19 | Discharge: 2018-02-19 | Disposition: A | Discharge status: Home / Self Care | Payer: BLUE CROSS/BLUE SHIELD | Source: Ambulatory Visit | Attending: Specialist | Admitting: Specialist

## 2018-02-19 ENCOUNTER — Ambulatory Visit: Payer: Self-pay | Admitting: Specialist

## 2018-02-19 ENCOUNTER — Encounter: Payer: Medicare (Managed Care) | Admitting: Specialist

## 2018-02-19 DIAGNOSIS — R42 Dizziness and giddiness: Secondary | ICD-10-CM

## 2018-02-19 DIAGNOSIS — H53483 Generalized contraction of visual field, bilateral: Secondary | ICD-10-CM

## 2018-02-19 DIAGNOSIS — R55 Syncope and collapse: Secondary | ICD-10-CM

## 2018-02-19 DIAGNOSIS — G5601 Carpal tunnel syndrome, right upper limb: Secondary | ICD-10-CM

## 2018-02-19 DIAGNOSIS — M5412 Radiculopathy, cervical region: Secondary | ICD-10-CM

## 2018-02-19 NOTE — Progress Notes (Signed)
[facility]  [address]  [place] [id number]    Progress Note    Name: [name] MRN:  [id number]   Date: 02/19/2018 Age: 57 y.o.       Chief Complaint: Dizziness and Vertigo    Subjective:  Dean Dienerlan was seen back.   A week back he was walking from the living room to the kitchen when he felt lightheaded and nearly passed out for a few seconds.  He did not have tonic-clonic movements, tongue-biting or incontinence.  He denied focal weakness or numbness.  His a awake electroencephalogram was normal.  Cranial MRI was unremarkable.  He has been receiving some physical therapy and he feels that his neck is related symptoms have improved.      Objective :    He was alert and was oriented.  Neurologic exam was nonfocal.   blood pressure was  110/70        Assessment:     ENCOUNTER DIAGNOSES     ICD-10-CM   1. Postural dizziness with presyncope R42    R55   2. Cervical radiculopathy M54.12   3. Carpal tunnel syndrome on right G56.01        Plan:     1. I feel that his pre syncopal attacks are probably blood pressure of cardiac in origin.  Attempts will be made to see if he can be seen by Cardiology a little earlier than has already been scheduled.    2. In the meantime I have asked him to cut metoprolol down to 25 mg a day from 25 mg twice a day.      3. Cranial MRI images were reviewed with patient     4. He underwent electromyography which was supportive of moderate carpal tunnel syndrome.  There was no evidence of cervical radiculopathy.  It is to be noted that in some patients with radiculopathies electromyography can be within normal limits.  I have asked him to splint is right wrist at night time.    5. If he has recurrent symptoms he may need to be seen in the emergency room.        Laconya Clere Elmer BalesUchila Diksha Tagliaferro, MD       This note was partially generated using MModal Fluency Direct System.There maybe some incorrect words, spelling, and punctuation that were not noted in checking the note before  saving.

## 2018-02-20 ENCOUNTER — Telehealth: Payer: Self-pay | Admitting: Specialist

## 2018-02-20 NOTE — Telephone Encounter (Signed)
Called patient to let him know that [facility] on 7-9 @ 2p. Left message with him and wife.

## 2018-02-25 ENCOUNTER — Encounter (INDEPENDENT_AMBULATORY_CARE_PROVIDER_SITE_OTHER): Payer: Self-pay | Admitting: Family

## 2018-02-26 ENCOUNTER — Ambulatory Visit (INDEPENDENT_AMBULATORY_CARE_PROVIDER_SITE_OTHER): Payer: Self-pay | Admitting: Cardiovascular Disease

## 2018-03-04 ENCOUNTER — Ambulatory Visit (INDEPENDENT_AMBULATORY_CARE_PROVIDER_SITE_OTHER): Payer: Self-pay | Admitting: Family

## 2018-03-04 ENCOUNTER — Ambulatory Visit: Payer: BLUE CROSS/BLUE SHIELD | Attending: Family | Admitting: Family

## 2018-03-04 VITALS — BP 158/88 | HR 63 | Temp 98.0°F | Ht 68.0 in | Wt 330.4 lb

## 2018-03-04 DIAGNOSIS — F39 Unspecified mood [affective] disorder: Secondary | ICD-10-CM

## 2018-03-04 DIAGNOSIS — I10 Essential (primary) hypertension: Secondary | ICD-10-CM | POA: Insufficient documentation

## 2018-03-04 DIAGNOSIS — Z6841 Body Mass Index (BMI) 40.0 and over, adult: Secondary | ICD-10-CM | POA: Insufficient documentation

## 2018-03-04 DIAGNOSIS — E119 Type 2 diabetes mellitus without complications: Secondary | ICD-10-CM

## 2018-03-04 DIAGNOSIS — R42 Dizziness and giddiness: Secondary | ICD-10-CM

## 2018-03-04 DIAGNOSIS — Z794 Long term (current) use of insulin: Secondary | ICD-10-CM | POA: Insufficient documentation

## 2018-03-04 MED ORDER — FUROSEMIDE 40 MG TABLET
40.0000 mg | ORAL_TABLET | Freq: Every day | ORAL | 1 refills | Status: AC
Start: 2018-03-04 — End: 2018-06-02

## 2018-03-04 MED ORDER — INSULIN DETEMIR (U-100) 100 UNIT/ML SUBCUTANEOUS SOLUTION
35.00 [IU] | Freq: Two times a day (BID) | SUBCUTANEOUS | 1 refills | Status: DC
Start: 2018-03-04 — End: 2018-10-04

## 2018-03-04 MED ORDER — FLUOXETINE 20 MG CAPSULE
20.0000 mg | ORAL_CAPSULE | Freq: Every day | ORAL | 1 refills | Status: DC
Start: 2018-03-04 — End: 2018-10-22

## 2018-03-04 NOTE — Nursing Note (Signed)
Pt presents today for 1 month follow up worker's comp claim.

## 2018-03-04 NOTE — Progress Notes (Signed)
[facility] [address]  [address]  [place] [address]  [phone]      Patient Name:  [name]  MRN:  [id number]  DOB:  May 02, 1961    Date of Service:  03/04/2018    Chief Complaint:   Chief Complaint   Patient presents with   . Blood Pressure F/U       Subjective:  [name] is a 57 y.o. male who returns to the office for follow-up of ongoing issues with dizziness and tunnel vision.  He says that he has been having some improvement and that MRI completed by Neurology was negative.   He is waiting on cardiology appt in August for possibility of further work-up.  Marland KitchenHe also says that he is having some issues with mood.  He feels that he is angry all the time.  He requests increase in medication for mood.     Pt says that he has had some improvement with the right arm and also neck pain.   He is doing well and feels that the pain is improving. He would like to add PT to the left hand/thumb.    Review of Systems:   Constitutional: negative  Ears, nose, mouth, throat, and face: negative  Respiratory: negative  Cardiovascular: negative  Integument/breast: negative  Musculoskeletal:positive for neck pain  Behavioral/Psych: positive for bad mood and mood swings    Current Outpatient Medications   Medication Sig   . albuterol sulfate (VENTOLIN HFA) 90 mcg/actuation Inhalation HFA Aerosol Inhaler Take 1-2 Puffs by inhalation Every 6 hours as needed   . Aspirin 81 mg Oral Tablet take 81 mg by mouth Once a day.   . dapagliflozin (FARXIGA) 10 mg Oral Tablet Take 10 mg by mouth Once a day   . FLUoxetine (PROZAC) 20 mg Oral Capsule Take 1 Cap (20 mg total) by mouth Once a day for 90 days   . fluticasone (FLONASE) 50 mcg/actuation Nasal Spray, Suspension 1 Spray by Each Nostril route Once a day   . fluticasone-salmeterol (ADVAIR) 250-50 mcg/dose Inhalation Disk with Device oral diskus inhaler Take 1 INHALATION by inhalation Twice daily for 90 days   . furosemide (LASIX) 40 mg Oral Tablet Take 1 Tab (40  mg total) by mouth Once a day for 90 days   . glipiZIDE (GLUCOTROL) 10 mg Oral Tablet Take 1 Tab (10 mg total) by mouth Every morning before breakfast Take 30 minutes before meals   . insulin detemir U-100 (LEVEMIR U-100 INSULIN) 100 unit/mL Subcutaneous Solution 35 Units by Subcutaneous route Twice daily for 90 days   . insulin lispro (HUMALOG KWIKPEN INSULIN) 100 unit/mL Subcutaneous Insulin Pen Three times a day with meals per sliding scale, up to 36 units a day4   . Metformin (GLUCOPHAGE) 1,000 mg Oral Tablet Take 1,000 mg by mouth Every morning with breakfast    . metoprolol (LOPRESSOR) 25 mg Oral Tablet take 1 Tab by mouth every 12 hours.   . multivitamin Oral Tablet Take 1 Tab by mouth Once a day   . naproxen (NAPROSYN) 500 mg Oral Tablet Take 1 Tab (500 mg total) by mouth Twice daily with food   . omeprazole (PRILOSEC) 40 mg Oral Capsule, Delayed Release(E.C.) Take 1 Cap (40 mg total) by mouth Once a day for 90 days   . rosuvastatin (CRESTOR) 20 mg Oral Tablet Take 1 Tab (20 mg total) by mouth Every evening for 90 days   . valsartan-hydroCHLOROthiazide (DIOVAN HCT) 160-25 mg Oral  Tablet Take 1 Tab by mouth Once a day for 90 days       Allergies   Allergen Reactions   . Tylox [Oxycodone-Acetaminophen]        Objective:    BP (!) 158/88   Pulse 63   Temp 36.7 C (98 F) (Oral)   Ht 1.727 m (5\' 8" )   Wt (!) 149.9 kg (330 lb 6.4 oz)   BMI 50.24 kg/m       General:  appears chronically ill and morbidly obese  Lungs:  Clear to auscultation bilaterally.   Cardiovascular:  regular rate and rhythm  Extremities:  No cyanosis or edema  Skin:  Skin warm and dry  Psychiatric:  Normal    Data reviewed:    Assessment/Plan:    ICD-10-CM    1. Essential hypertension I10    2. Mood disorder (CMS HCC) F39 FLUoxetine (PROZAC) 20 mg Oral Capsule   3. Dizziness R42    4. Diabetes mellitus (CMS HCC) E11.9        Orders Placed This Encounter   . FLUoxetine (PROZAC) 20 mg Oral Capsule   . insulin detemir U-100 (LEVEMIR U-100  INSULIN) 100 unit/mL Subcutaneous Solution   . furosemide (LASIX) 40 mg Oral Tablet       Diet Recommended: Healthy Eating - low fat, low simple carbohydrate.  Lots of fresh fruit & vegetables and whole grains.  Lean Meats    Follow up:  Return in about 2 months (around 05/05/2018).      Cheryll DessertAmy Coffman, APRN  03/04/2018, 11:18

## 2018-03-04 NOTE — Telephone Encounter (Signed)
Message forwarded to [name]

## 2018-03-04 NOTE — Telephone Encounter (Signed)
Regarding: PHARMACY CALL  ----- Message from Ranae PlumberKimberly A Riffle sent at 03/04/2018 12:39 PM EDT -----  [facility] called Did you want to give Hessie DienerAlan 21 vials of Levemir or were you wanting the Levemir pen

## 2018-04-01 ENCOUNTER — Encounter: Payer: Self-pay | Admitting: Specialist

## 2018-04-02 ENCOUNTER — Encounter (INDEPENDENT_AMBULATORY_CARE_PROVIDER_SITE_OTHER): Payer: Self-pay | Admitting: Cardiovascular Disease

## 2018-04-02 ENCOUNTER — Ambulatory Visit (INDEPENDENT_AMBULATORY_CARE_PROVIDER_SITE_OTHER): Payer: Self-pay | Admitting: Cardiovascular Disease

## 2018-04-02 ENCOUNTER — Ambulatory Visit: Payer: BLUE CROSS/BLUE SHIELD | Attending: Cardiovascular Disease | Admitting: Cardiovascular Disease

## 2018-04-02 VITALS — BP 142/80 | HR 98 | Temp 99.3°F | Resp 18 | Ht 68.0 in | Wt 320.8 lb

## 2018-04-02 DIAGNOSIS — R42 Dizziness and giddiness: Secondary | ICD-10-CM

## 2018-04-02 DIAGNOSIS — I10 Essential (primary) hypertension: Secondary | ICD-10-CM | POA: Insufficient documentation

## 2018-04-02 DIAGNOSIS — R079 Chest pain, unspecified: Secondary | ICD-10-CM

## 2018-04-02 DIAGNOSIS — Z6841 Body Mass Index (BMI) 40.0 and over, adult: Secondary | ICD-10-CM | POA: Insufficient documentation

## 2018-04-02 DIAGNOSIS — R55 Syncope and collapse: Secondary | ICD-10-CM | POA: Insufficient documentation

## 2018-04-02 NOTE — Nursing Note (Signed)
Patient is a 57 y.o. male that presents today from PCP for HTN and dizziness. Jerrol BananaStephanie Vicke Plotner, LPN  1/61/09608/13/2019, 09:02

## 2018-04-02 NOTE — Progress Notes (Addendum)
CARDIOLOGY, [facility]  [address]  [place] [id number]  Phone: [phone]  Fax: [phone]    Encounter Date: 04/02/2018    Patient ID:  [name]  IHK:[id number]    DOB: 11/13/1960  Age: 57 y.o. male    Subjective:     Chief Complaint   Patient presents with   . Hypertension   . Dizziness     HPI:  New patient referred by his PCP because of episodes of dizziness, and near-syncope.  He has had these symptoms over the last 4-5 months.  Neurology has been unable to find any etiology so far.  He says they are somewhat sporadic.  He says he may not have an episode for couple of weeks then he may have 2 or 3 a week.  They usually arise for no reason.  He says he feels headache come on, he then feels lightheaded and dizzy and at times feels like he may pass out if he does not get down and sit on the floor.  He usually does try to sit on the floor and lay flat and after about 30 seconds to a minute he starts to feel better.  He usually feels tired after that however and usually gets up to lay down and rest for a while mainly to keep himself from having any more episodes or injuring himself.  He has not had any actual syncope.  He does have some episodes of palpitations that occur less frequently.  He describes sensation of feeling his heart racing away, just lasting a couple of minutes, and resolving.  These episodes however are not associated with the episodes of dizziness.  He has some occasional chest tightness that occurs a couple of times a month last few minutes and resolved.  He had a normal stress test about 7 or 8 years ago.  He recently had an unremarkable carotid ultrasound but has not had any other cardiac testing recently.  He does have risk factors for heart disease.  Current Outpatient Medications   Medication Sig   . albuterol sulfate (VENTOLIN HFA) 90 mcg/actuation Inhalation HFA Aerosol Inhaler Take 1-2 Puffs by inhalation Every 6 hours as needed   . Aspirin 81 mg  Oral Tablet take 81 mg by mouth Once a day.   . dapagliflozin (FARXIGA) 10 mg Oral Tablet Take 10 mg by mouth Once a day   . FLUoxetine (PROZAC) 20 mg Oral Capsule Take 1 Cap (20 mg total) by mouth Once a day for 90 days   . fluticasone-salmeterol (ADVAIR) 250-50 mcg/dose Inhalation Disk with Device oral diskus inhaler Take 1 INHALATION by inhalation Twice daily for 90 days   . furosemide (LASIX) 40 mg Oral Tablet Take 1 Tab (40 mg total) by mouth Once a day for 90 days   . glipiZIDE (GLUCOTROL) 10 mg Oral Tablet Take 1 Tab (10 mg total) by mouth Every morning before breakfast Take 30 minutes before meals   . insulin detemir U-100 (LEVEMIR U-100 INSULIN) 100 unit/mL Subcutaneous Solution 35 Units by Subcutaneous route Twice daily for 90 days   . insulin lispro (HUMALOG KWIKPEN INSULIN) 100 unit/mL Subcutaneous Insulin Pen Three times a day with meals per sliding scale, up to 36 units a day4   . Metformin (GLUCOPHAGE) 1,000 mg Oral Tablet Take 1,000 mg by mouth Every morning with breakfast    . metoprolol (LOPRESSOR) 25 mg Oral Tablet take 1 Tab by mouth every 12 hours. (Patient taking differently:  Take 25 mg by mouth Once a day )   . multivitamin Oral Tablet Take 1 Tab by mouth Once a day   . naproxen (NAPROSYN) 500 mg Oral Tablet Take 1 Tab (500 mg total) by mouth Twice daily with food   . rosuvastatin (CRESTOR) 20 mg Oral Tablet Take 1 Tab (20 mg total) by mouth Every evening for 90 days   . valsartan-hydroCHLOROthiazide (DIOVAN HCT) 160-25 mg Oral Tablet Take 1 Tab by mouth Once a day for 90 days     Allergies   Allergen Reactions   . Tylox [Oxycodone-Acetaminophen]      Past Medical History:   Diagnosis Date   . Ankle injury    . Cellulitis 2013   . CPAP (continuous positive airway pressure) dependence    . Diabetes    . HTN    . Knee injury    . Morbid obesity (CMS HCC)    . Nerve damage    . Sleep apnea          Past Surgical History:   Procedure Laterality Date   . COLONOSCOPY  11/22/2017    Dr Georgian Co   . HX  CATARACT REMOVAL     . HX HERNIA REPAIR      Bilateral   . HX HERNIA REPAIR     . HX HERNIA REPAIR      inguinal and umbilical repair    . HX ORCHIOPEXY     . HX TONSILLECTOMY     . HX WISDOM TEETH EXTRACTION           Family Medical History:     Problem Relation (Age of Onset)    Cancer Mother    Congestive Heart Failure Maternal Grandmother    Diabetes Paternal Uncle            Social History     Tobacco Use   . Smoking status: Never Smoker   . Smokeless tobacco: Never Used   Substance Use Topics   . Alcohol use: No   . Drug use: No       Review of Systems   Constitutional: Negative for chills and fever.   Respiratory: Positive for chest tightness. Negative for shortness of breath.    Cardiovascular: Positive for palpitations and leg swelling (Chronic). Negative for chest pain.   Gastrointestinal: Negative for abdominal pain and blood in stool.   Neurological: Positive for dizziness. Negative for syncope and light-headedness.     Objective:   Vitals: BP (!) 142/80 (Site: Right, Patient Position: Sitting, Cuff Size: Adult Large)   Pulse 98   Temp 37.4 C (99.3 F) (Oral)   Resp 18   Ht 1.727 m (5\' 8" )   Wt (!) 145.5 kg (320 lb 12.8 oz)   SpO2 95%   BMI 48.78 kg/m         Physical Exam   Constitutional: He is oriented to person, place, and time.   HENT:   Head: Normocephalic and atraumatic.   Neck: Normal range of motion. Neck supple.   Cardiovascular: Normal rate and regular rhythm.   Pulmonary/Chest: Breath sounds normal.   Abdominal: Soft. There is no tenderness.   Musculoskeletal: He exhibits edema.   Neurological: He is alert and oriented to person, place, and time.   Skin: Skin is warm and dry.       Assessment & Plan:     ENCOUNTER DIAGNOSES     ICD-10-CM   1. Near syncope R55   2. Essential  hypertension I10   3. Dizziness R42   4. Chest pain R07.9     Plan:  I told him to make sure and get a blood pressure cuff for home and checks blood pressure when he feels dizzy.  I also told him to minimize his  caffeine intake and try to focus on keeping himself hydrated.  We will schedule him for an echo and also arrange a 30 day event monitor to rule out any arrhythmias.  His chest pain sounds fairly minimal with regards to his symptom burden so we will hold off on any stress testing since he did have any ischemic changes on his EKG today.  Told him to call us however if symptoms were to worsen.  We will tentatively see him back in 6 weeks.  He reports his job entails a lot of driving and traveling.  With his symptoms at probably best that he avoid work at this point because of the risks driving and traveling with his dizziness of undetermined etiology  No orders of the defined types were placed in this encounter.      No follow-ups on file.    Mariea StableGerardo C Rishawn Walck, MD

## 2018-04-03 ENCOUNTER — Encounter (INDEPENDENT_AMBULATORY_CARE_PROVIDER_SITE_OTHER): Payer: Self-pay | Admitting: Family

## 2018-04-03 ENCOUNTER — Other Ambulatory Visit (INDEPENDENT_AMBULATORY_CARE_PROVIDER_SITE_OTHER): Payer: Self-pay | Admitting: Family

## 2018-04-03 MED ORDER — METOPROLOL TARTRATE 25 MG TABLET
25.00 mg | ORAL_TABLET | Freq: Two times a day (BID) | ORAL | 2 refills | Status: DC
Start: 2018-04-03 — End: 2018-04-04

## 2018-04-04 ENCOUNTER — Other Ambulatory Visit (INDEPENDENT_AMBULATORY_CARE_PROVIDER_SITE_OTHER): Payer: Self-pay | Admitting: Family

## 2018-04-08 MED ORDER — METOPROLOL TARTRATE 25 MG TABLET
25.00 mg | ORAL_TABLET | Freq: Two times a day (BID) | ORAL | 2 refills | Status: DC
Start: 2018-04-08 — End: 2018-07-17

## 2018-04-10 ENCOUNTER — Other Ambulatory Visit (INDEPENDENT_AMBULATORY_CARE_PROVIDER_SITE_OTHER): Payer: Self-pay | Admitting: Family

## 2018-04-12 ENCOUNTER — Other Ambulatory Visit (INDEPENDENT_AMBULATORY_CARE_PROVIDER_SITE_OTHER): Payer: Self-pay | Admitting: Internal Medicine

## 2018-04-12 ENCOUNTER — Other Ambulatory Visit (INDEPENDENT_AMBULATORY_CARE_PROVIDER_SITE_OTHER): Payer: Self-pay | Admitting: Family

## 2018-04-12 MED ORDER — ALBUTEROL SULFATE HFA 90 MCG/ACTUATION AEROSOL INHALER
1.0000 | INHALATION_SPRAY | Freq: Four times a day (QID) | RESPIRATORY_TRACT | 0 refills | Status: DC | PRN
Start: 2018-04-12 — End: 2018-04-15

## 2018-04-15 ENCOUNTER — Encounter (INDEPENDENT_AMBULATORY_CARE_PROVIDER_SITE_OTHER): Payer: Self-pay | Admitting: Cardiovascular Disease

## 2018-04-15 ENCOUNTER — Other Ambulatory Visit (INDEPENDENT_AMBULATORY_CARE_PROVIDER_SITE_OTHER): Payer: Self-pay | Admitting: Family

## 2018-04-15 MED ORDER — ALBUTEROL SULFATE HFA 90 MCG/ACTUATION AEROSOL INHALER
1.0000 | INHALATION_SPRAY | Freq: Four times a day (QID) | RESPIRATORY_TRACT | 0 refills | Status: DC | PRN
Start: 2018-04-15 — End: 2018-06-17

## 2018-04-18 ENCOUNTER — Telehealth (INDEPENDENT_AMBULATORY_CARE_PROVIDER_SITE_OTHER): Payer: Self-pay | Admitting: Cardiovascular Disease

## 2018-04-18 NOTE — Nursing Note (Signed)
Left a message letting the patient know his Echo is scheduled for 04/24/18 at 1:00 arrive 15-20 minute early to register and to call the office with any questions. Jerrol BananaStephanie Ela Moffat, LPN  1/61/09608/29/2019, 16:00

## 2018-04-24 ENCOUNTER — Ambulatory Visit
Admission: RE | Admit: 2018-04-24 | Discharge: 2018-04-24 | Disposition: A | Discharge status: Home / Self Care | Payer: BC Managed Care – PPO | Source: Ambulatory Visit | Attending: Cardiovascular Disease | Admitting: Cardiovascular Disease

## 2018-04-24 DIAGNOSIS — R55 Syncope and collapse: Secondary | ICD-10-CM | POA: Insufficient documentation

## 2018-04-24 DIAGNOSIS — I10 Essential (primary) hypertension: Secondary | ICD-10-CM | POA: Insufficient documentation

## 2018-04-24 MED ORDER — PERFLUTREN LIPID MICROSPHERES 1.1 MG/ML INTRAVENOUS SUSPENSION
2.00 mL | Freq: Once | INTRAVENOUS | Status: DC | PRN
Start: 2018-04-24 — End: 2018-04-25
  Administered 2018-04-24: 12:00:00 2 mL via INTRAVENOUS
  Filled 2018-04-24: qty 2

## 2018-04-29 ENCOUNTER — Ambulatory Visit (INDEPENDENT_AMBULATORY_CARE_PROVIDER_SITE_OTHER): Payer: Self-pay | Admitting: Cardiovascular Disease

## 2018-04-29 DIAGNOSIS — R55 Syncope and collapse: Secondary | ICD-10-CM

## 2018-04-29 LAB — TRANSTHORACIC ECHOCARDIOGRAM - ADULT
Ascending aorta: 3.4 cm
E wave decelartion time: 176 ms
E/A ratio: 1.4
E/E' ratio: 14
LA Volume Index: 14.6 ml/m2
LA volume: 28.9 cm3
Left ant tibial dis sys ratio: 3.4 cm
Left ant tibial dis sys ratio: 3.4 cm
Left ant tibial dis sys ratio: 3.4 cm
MV Peak A Vel: 43.7 cm/s
MV Peak E Vel: 61.1 cm/s
MV stenosis pressure 1/2 time: 52 ms
RVDD: 2.31 cm
RVDD: 2.31 cm
TDI: 4.35 cm/s
TDI: 4.35 cm/s

## 2018-04-29 NOTE — Telephone Encounter (Signed)
Received call from [facility] for report Sept 7 0846 for 4 beat run VT. Dr. Shawnie Dapper notified and had already seen previously faxed report.

## 2018-04-30 ENCOUNTER — Ambulatory Visit (INDEPENDENT_AMBULATORY_CARE_PROVIDER_SITE_OTHER): Payer: Self-pay | Admitting: Cardiovascular Disease

## 2018-04-30 NOTE — Telephone Encounter (Signed)
Called patient with [facility] Urgent report  results.  Per Dr. Shawnie Dapper - patient had some CP and 4 beat run NSVT - could be nothing but recommends an MPS     Patient notified and verbalized understanding.     Report is scanned to the order and can also be found in media.

## 2018-05-08 ENCOUNTER — Ambulatory Visit (INDEPENDENT_AMBULATORY_CARE_PROVIDER_SITE_OTHER): Payer: BC Managed Care – PPO

## 2018-05-08 ENCOUNTER — Ambulatory Visit: Payer: BC Managed Care – PPO | Attending: Family | Admitting: Family

## 2018-05-08 VITALS — BP 126/79 | HR 86 | Temp 100.3°F | Ht 68.0 in | Wt 314.3 lb

## 2018-05-08 DIAGNOSIS — Z23 Encounter for immunization: Secondary | ICD-10-CM | POA: Insufficient documentation

## 2018-05-08 DIAGNOSIS — Z6841 Body Mass Index (BMI) 40.0 and over, adult: Secondary | ICD-10-CM | POA: Insufficient documentation

## 2018-05-08 DIAGNOSIS — E785 Hyperlipidemia, unspecified: Secondary | ICD-10-CM | POA: Insufficient documentation

## 2018-05-08 DIAGNOSIS — R42 Dizziness and giddiness: Secondary | ICD-10-CM | POA: Insufficient documentation

## 2018-05-08 DIAGNOSIS — F39 Unspecified mood [affective] disorder: Secondary | ICD-10-CM | POA: Insufficient documentation

## 2018-05-08 DIAGNOSIS — I10 Essential (primary) hypertension: Secondary | ICD-10-CM | POA: Insufficient documentation

## 2018-05-08 DIAGNOSIS — E119 Type 2 diabetes mellitus without complications: Secondary | ICD-10-CM | POA: Insufficient documentation

## 2018-05-08 LAB — COMPREHENSIVE METABOLIC PNL, FASTING
ALBUMIN/GLOBULIN RATIO: 1.2 (ref 1.1–2.2)
ALBUMIN: 4.1 g/dL (ref 3.5–5.0)
ALKALINE PHOSPHATASE: 54 U/L (ref 38–126)
ALT (SGPT): 24 U/L (ref 17–63)
ANION GAP: 14 mmol/L (ref 5–15)
AST (SGOT): 18 U/L (ref 15–41)
BILIRUBIN TOTAL: 1.3 mg/dL — ABNORMAL HIGH (ref 0.3–1.2)
BUN/CREA RATIO: 22 — ABNORMAL HIGH (ref 6–20)
BUN: 23 mg/dL (ref 8–26)
CALCIUM: 9.4 mg/dL (ref 8.9–10.3)
CHLORIDE: 96 mmol/L — ABNORMAL LOW (ref 101–111)
CO2 TOTAL: 27 mmol/L (ref 22–32)
CREATININE: 1.03 mg/dL (ref 0.90–1.30)
ESTIMATED GFR: >60 mL/min/1.73mˆ2 (ref >60)
GLUCOSE: 188 mg/dL — ABNORMAL HIGH (ref 70–110)
POTASSIUM: 4.7 mmol/L (ref 3.6–5.1)
PROTEIN TOTAL: 7.6 g/dL (ref 6.4–8.3)
SODIUM: 137 mmol/L (ref 136–144)

## 2018-05-08 LAB — LIPID PANEL
CHOL/HDL RATIO: 4.3 (ref 0.0–5.0)
CHOLESTEROL: 124 mg/dL (ref 0–199)
HDL CHOL: 29 mg/dL (ref 29–71)
HDL CHOL: 29 mg/dL (ref 29–71)
LDL CALC: 54 mg/dL (ref 0–100)
LDL CALC: 54 mg/dL (ref 0–100)
TRIGLYCERIDES: 205 mg/dL — ABNORMAL HIGH (ref 0–149)
VLDL CALC: 41 mg/dL (ref 6–49)

## 2018-05-08 LAB — CBC WITH DIFF
BASOPHIL #: <0.1 x10ˆ3/uL (ref <=0.20)
BASOPHIL %: 1 %
EOSINOPHIL #: 0.12 x10ˆ3/uL (ref <=0.50)
EOSINOPHIL %: 2 %
HCT: 52.6 % — ABNORMAL HIGH (ref 38.9–52.0)
HGB: 17.4 g/dL (ref 13.4–17.5)
IMMATURE GRANULOCYTE #: <0.1 x10ˆ3/uL (ref <0.10)
IMMATURE GRANULOCYTE %: 0 % (ref 0–1)
LYMPHOCYTE #: 2.02 x10ˆ3/uL (ref 1.00–4.80)
LYMPHOCYTE %: 27 %
MCH: 29 pg (ref 26.0–32.0)
MCHC: 33.1 g/dL (ref 31.0–35.5)
MCHC: 33.1 g/dL (ref 31.0–35.5)
MCV: 87.7 fL (ref 78.0–100.0)
MONOCYTE #: 0.62 x10ˆ3/uL (ref 0.20–1.10)
MONOCYTE %: 8 %
MPV: 9.4 fL (ref 8.7–12.5)
NEUTROPHIL #: 4.73 x10ˆ3/uL (ref 1.50–7.70)
NEUTROPHIL %: 62 %
PLATELETS: 283 x10ˆ3/uL (ref 150–400)
RBC: 6 x10ˆ6/uL (ref 4.50–6.10)
RDW-CV: 12.9 % (ref 11.5–15.5)
WBC: 7.6 x10ˆ3/uL (ref 3.7–11.0)

## 2018-05-08 LAB — HGA1C (HEMOGLOBIN A1C WITH EST AVG GLUCOSE)
ESTIMATED AVERAGE GLUCOSE: 275 mg/dL
HEMOGLOBIN A1C: 11.2 % — ABNORMAL HIGH (ref 4.3–6.1)

## 2018-05-08 NOTE — Nursing Note (Signed)
Pt presents today for 2 month follow up on "everything".

## 2018-05-08 NOTE — Progress Notes (Signed)
[facility]  [address]  [place] [address]  [phone]      Patient Name:  [name]  MRN:  [id number]  DOB:  08-Jun-1961    Date of Service:  05/08/2018    Chief Complaint:   Chief Complaint   Patient presents with   . Multiple medical problems follow up     Pt presents today for 2 month follow up on "everything".        Subjective:  [name] is a 57 y.o. male who returns to the office to follow-up on longstanding DM2, hypertension, hyperlipidemia.   PT recently saw cardiology and had an event monitor which showed 4-5 beats of V tach.  He is awaiting scheduling of MPS stress test per that office.      Pt says that he has been making an effort to watch carbohydrate intake.  He has had a 6 pound weight loss recently.   He reports that blood glucose readings have been improving with some afternoons at 300 when he reports 400s previously.  Some fastings in the mornings are int he 130s/    Review of Systems:   Constitutional: negative  Ears, nose, mouth, throat, and face: negative  Respiratory: negative  Cardiovascular: negative  Gastrointestinal: negative  Behavioral/Psych: negative  Endocrine: positive for diabetes and obesity and hyperlipidemia    Current Outpatient Medications   Medication Sig   . albuterol sulfate (VENTOLIN HFA) 90 mcg/actuation Inhalation HFA Aerosol Inhaler Take 1-2 Puffs by inhalation Every 6 hours as needed   . Aspirin 81 mg Oral Tablet take 81 mg by mouth Once a day.   . dapagliflozin (FARXIGA) 10 mg Oral Tablet Take 10 mg by mouth Once a day   . FLUoxetine (PROZAC) 20 mg Oral Capsule Take 1 Cap (20 mg total) by mouth Once a day for 90 days   . fluticasone-salmeterol (ADVAIR) 250-50 mcg/dose Inhalation Disk with Device oral diskus inhaler Take 1 INHALATION by inhalation Twice daily for 90 days   . furosemide (LASIX) 40 mg Oral Tablet Take 1 Tab (40 mg total) by mouth Once a day for 90 days   . glipiZIDE (GLUCOTROL) 10 mg Oral Tablet Take 1 Tab (10  mg total) by mouth Every morning before breakfast Take 30 minutes before meals   . insulin detemir U-100 (LEVEMIR U-100 INSULIN) 100 unit/mL Subcutaneous Solution 35 Units by Subcutaneous route Twice daily for 90 days (Patient taking differently: 38 Units by Subcutaneous route Twice daily )   . insulin lispro (HUMALOG KWIKPEN INSULIN) 100 unit/mL Subcutaneous Insulin Pen Three times a day with meals per sliding scale, up to 36 units a day4   . Metformin (GLUCOPHAGE) 1,000 mg Oral Tablet Take 1,000 mg by mouth Every morning with breakfast    . metoprolol tartrate (LOPRESSOR) 25 mg Oral Tablet Take 1 Tab (25 mg total) by mouth Every 12 hours (Patient taking differently: Take 25 mg by mouth Once a day )   . multivitamin Oral Tablet Take 1 Tab by mouth Once a day   . rosuvastatin (CRESTOR) 20 mg Oral Tablet Take 1 Tab (20 mg total) by mouth Every evening for 90 days   . valsartan-hydroCHLOROthiazide (DIOVAN HCT) 160-25 mg Oral Tablet Take 1 Tab by mouth Once a day for 90 days       Allergies   Allergen Reactions   . Tylox [Oxycodone-Acetaminophen]        Objective:    BP 126/79  Pulse 86   Temp 37.9 C (100.3 F) (Bladder)   Ht 1.727 m (5\' 8" )   Wt (!) 142.6 kg (314 lb 4.8 oz)   BMI 47.79 kg/m       General:  morbidly obese  Lungs:  Clear to auscultation bilaterally.   Cardiovascular:  regular rate and rhythm  Extremities:  No cyanosis or edema  Skin:  Skin warm and dry  Psychiatric:  Normal    Data reviewed:  Reviewed previous lab results including cmp, hgb A1c, and lipids  Assessment/Plan:    ICD-10-CM    1. Essential hypertension I10 CBC/Diff     Comprehensive Metabolic Panel (Fasting)     Lipid Panel-6 months     CBC/Diff     CBC WITH DIFF   2. Diabetes mellitus (CMS HCC) E11.9 Comprehensive Metabolic Panel (Fasting)     Lipid Panel-6 months     Hemoglobin A1C     Comprehensive Metabolic Panel (Fasting)     Lipid Panel     Hemoglobin A1C   3. Hyperlipidemia, unspecified hyperlipidemia type E78.5 Comprehensive  Metabolic Panel (Fasting)     Lipid Panel-6 months     Comprehensive Metabolic Panel (Fasting)     Lipid Panel   4. Morbid obesity (CMS HCC) E66.01    5. Mood disorder (CMS HCC) F39    6. Dizziness R42        Orders Placed This Encounter   . Influenza Vaccine IM 0.155ml Age 72 Months through Adult(Admin)   . CBC/Diff   . Comprehensive Metabolic Panel (Fasting)   . Lipid Panel-6 months   . Hemoglobin A1C   . CBC WITH DIFF     Pt remains off work until cleared per cardiology    Diet Recommended: Healthy Eating - low fat, low simple carbohydrate.  Lots of fresh fruit & vegetables and whole grains.  Lean Meats    Follow up:  Return in about 3 months (around 08/07/2018).      Cheryll DessertAmy Coffman, APRN  05/08/2018, 10:26

## 2018-05-14 ENCOUNTER — Encounter (INDEPENDENT_AMBULATORY_CARE_PROVIDER_SITE_OTHER): Payer: Self-pay | Admitting: Family

## 2018-05-14 ENCOUNTER — Ambulatory Visit: Payer: BC Managed Care – PPO | Attending: Cardiovascular Disease | Admitting: Cardiovascular Disease

## 2018-05-14 ENCOUNTER — Encounter (INDEPENDENT_AMBULATORY_CARE_PROVIDER_SITE_OTHER): Payer: Self-pay | Admitting: Cardiovascular Disease

## 2018-05-14 VITALS — BP 150/98 | HR 74 | Temp 98.7°F | Resp 18 | Ht 68.0 in | Wt 330.0 lb

## 2018-05-14 DIAGNOSIS — I4729 Other ventricular tachycardia: Secondary | ICD-10-CM

## 2018-05-14 DIAGNOSIS — R42 Dizziness and giddiness: Secondary | ICD-10-CM | POA: Insufficient documentation

## 2018-05-14 DIAGNOSIS — I472 Ventricular tachycardia: Secondary | ICD-10-CM | POA: Insufficient documentation

## 2018-05-14 NOTE — Progress Notes (Signed)
CARDIOLOGY, [facility]  [address]  [place] [address]  Phone: [phone]  Fax: [phone]    Encounter Date: 05/14/2018    Patient ID:  [name]  WNU:[id number]    DOB: 08/05/1961  Age: 57 y.o. male    Subjective:     Chief Complaint   Patient presents with   . Syncope     HPI:  Patient returns for a follow-up visit today.  We had seen him as a new patient because of episodes of dizziness and near-syncope.  I do not believe he had actually ever had any complete loss of consciousness.  Neurology could not find any etiology so he was referred here for evaluation.  We arranged a 30 day event monitor for him and he also had an echo.  The echo showed good left ventricular systolic function and looked fairly unremarkable.  He did have a 4 beat run of nonsustained V-tach on the monitor.  He says he had about 15 episodes to the month but I am not sure we have obtained all of the tracings.  We have tentatively scheduled him for a stress test to rule out any ischemia.  For the most part his symptoms remain unchanged.  He has episodes that seem to occur for no reason.  He says it seems to start with a frontal headache, he then feels very dizzy.  He feels like he is developing tunnel vision and he has to go lay down.  He says sometimes symptoms can last up to an hour.  He has not had any complete loss of consciousness that he is aware of.  Current Outpatient Medications   Medication Sig   . albuterol sulfate (VENTOLIN HFA) 90 mcg/actuation Inhalation HFA Aerosol Inhaler Take 1-2 Puffs by inhalation Every 6 hours as needed   . Aspirin 81 mg Oral Tablet take 81 mg by mouth Once a day.   . dapagliflozin (FARXIGA) 10 mg Oral Tablet Take 10 mg by mouth Once a day   . FLUoxetine (PROZAC) 20 mg Oral Capsule Take 1 Cap (20 mg total) by mouth Once a day for 90 days   . fluticasone-salmeterol (ADVAIR) 250-50 mcg/dose Inhalation Disk with Device oral diskus inhaler Take 1 INHALATION by inhalation  Twice daily for 90 days   . furosemide (LASIX) 40 mg Oral Tablet Take 1 Tab (40 mg total) by mouth Once a day for 90 days   . glipiZIDE (GLUCOTROL) 10 mg Oral Tablet Take 1 Tab (10 mg total) by mouth Every morning before breakfast Take 30 minutes before meals   . insulin detemir U-100 (LEVEMIR U-100 INSULIN) 100 unit/mL Subcutaneous Solution 35 Units by Subcutaneous route Twice daily for 90 days (Patient taking differently: 38 Units by Subcutaneous route Twice daily )   . insulin lispro (HUMALOG KWIKPEN INSULIN) 100 unit/mL Subcutaneous Insulin Pen Three times a day with meals per sliding scale, up to 36 units a day4   . Metformin (GLUCOPHAGE) 1,000 mg Oral Tablet Take 1,000 mg by mouth Every morning with breakfast    . metoprolol tartrate (LOPRESSOR) 25 mg Oral Tablet Take 1 Tab (25 mg total) by mouth Every 12 hours (Patient taking differently: Take 25 mg by mouth Once a day )   . multivitamin Oral Tablet Take 1 Tab by mouth Once a day   . rosuvastatin (CRESTOR) 20 mg Oral Tablet Take 1 Tab (20 mg total) by mouth Every evening for 90 days   . valsartan-hydroCHLOROthiazide (DIOVAN  HCT) 160-25 mg Oral Tablet Take 1 Tab by mouth Once a day for 90 days     Allergies   Allergen Reactions   . Tylox [Oxycodone-Acetaminophen]      Past Medical History:   Diagnosis Date   . Ankle injury    . Cellulitis 2013   . CPAP (continuous positive airway pressure) dependence    . Diabetes    . HTN    . Knee injury    . Morbid obesity (CMS HCC)    . Nerve damage    . Sleep apnea          Past Surgical History:   Procedure Laterality Date   . COLONOSCOPY  11/22/2017    Dr Georgian Co   . HX CATARACT REMOVAL     . HX HERNIA REPAIR      Bilateral   . HX HERNIA REPAIR     . HX HERNIA REPAIR      inguinal and umbilical repair    . HX ORCHIOPEXY     . HX TONSILLECTOMY     . HX WISDOM TEETH EXTRACTION           Family Medical History:     Problem Relation (Age of Onset)    Cancer Mother    Congestive Heart Failure Maternal Grandmother    Diabetes  Paternal Uncle            Social History     Tobacco Use   . Smoking status: Never Smoker   . Smokeless tobacco: Never Used   Substance Use Topics   . Alcohol use: No   . Drug use: No       Review of Systems   Constitutional: Negative for chills and fever.   Respiratory: Negative for chest tightness and shortness of breath.    Cardiovascular: Negative for chest pain, palpitations and leg swelling.   Gastrointestinal: Negative for abdominal pain and blood in stool.   Neurological: Positive for dizziness. Negative for syncope and light-headedness.     Objective:   Vitals: BP (!) 150/98 (Site: Left, Patient Position: Sitting, Cuff Size: Adult Large)   Pulse 74   Temp 37.1 C (98.7 F) (Oral)   Resp 18   Ht 1.727 m (5\' 8" )   Wt (!) 149.7 kg (330 lb)   SpO2 96%   BMI 50.18 kg/m         Physical Exam   Constitutional: He is oriented to person, place, and time. He appears well-developed and well-nourished.   HENT:   Head: Normocephalic and atraumatic.   Neck: Neck supple.   Cardiovascular: Regular rhythm.   No murmur heard.  Pulmonary/Chest: Effort normal and breath sounds normal.   Abdominal: Soft. There is no tenderness.   Musculoskeletal: He exhibits no edema.   Neurological: He is alert and oriented to person, place, and time.   Skin: Skin is warm and dry.   Psychiatric: He has a normal mood and affect. His behavior is normal.   Nursing note and vitals reviewed.      Assessment & Plan:     ENCOUNTER DIAGNOSES     ICD-10-CM   1. NSVT (nonsustained ventricular tachycardia) (CMS HCC) I47.2   2. Dizziness R42     Plan:  Still not sure what's causing his dizziness.  We arranged a nuclear stress test to rule out ischemia because of the nonsustained V-tach.  He still has not been keeping track of his blood pressures so I asked him to  do that.  We will try to make sure that we have all of the tracings from his event monitor.  If we do not turned anything up he may need to go back to see neurology.  We will see him back  tentatively in 6 weeks unless something turns up that we need to see him sooner.  Orders Placed This Encounter   . Myocardial Perfusion Complete       Return in about 6 weeks (around 06/25/2018).    Mariea StableGerardo C Berthold Glace, MD

## 2018-05-18 ENCOUNTER — Encounter (INDEPENDENT_AMBULATORY_CARE_PROVIDER_SITE_OTHER): Payer: Self-pay | Admitting: Family

## 2018-05-18 ENCOUNTER — Encounter (INDEPENDENT_AMBULATORY_CARE_PROVIDER_SITE_OTHER): Payer: Self-pay | Admitting: Cardiovascular Disease

## 2018-05-20 ENCOUNTER — Encounter (INDEPENDENT_AMBULATORY_CARE_PROVIDER_SITE_OTHER): Payer: Self-pay | Admitting: Cardiovascular Disease

## 2018-05-21 NOTE — Telephone Encounter (Signed)
Spoke with [employer] who will fill out their portion of the paperwork and fax to our office.

## 2018-05-21 NOTE — Telephone Encounter (Signed)
-----   Message from [name], APRN sent at 05/20/2018  9:32 AM EDT -----  Regarding: FW: Visit Follow-Up Question  Contact: [phone]  Please call for paperwork>?  ----- Message -----  From: Leonia Corona, LPN  Sent: 0/98/1191   7:54 AM EDT  To: [name], APRN  Subject: FW: Visit Follow-Up Question                         ----- Message -----  From: Jory Ee  Sent: 05/18/2018   6:56 PM EDT  To: [facility] [address] Nurse Triage  Subject: Visit Follow-Up Question                         Good evening,    Would it be possible for you to call [employer] at [phone] for them to send you a form so if we get majorly behind in our payment, they will not shut off my servce because of my CPAP device. They said my provider has to call.    Thank you

## 2018-06-03 ENCOUNTER — Encounter (INDEPENDENT_AMBULATORY_CARE_PROVIDER_SITE_OTHER): Payer: Self-pay | Admitting: Family

## 2018-06-17 ENCOUNTER — Other Ambulatory Visit (INDEPENDENT_AMBULATORY_CARE_PROVIDER_SITE_OTHER): Payer: Self-pay | Admitting: Family

## 2018-06-18 MED ORDER — ALBUTEROL SULFATE HFA 90 MCG/ACTUATION AEROSOL INHALER
1.0000 | INHALATION_SPRAY | Freq: Four times a day (QID) | RESPIRATORY_TRACT | 0 refills | Status: DC | PRN
Start: 2018-06-18 — End: 2018-07-01

## 2018-07-01 ENCOUNTER — Encounter (INDEPENDENT_AMBULATORY_CARE_PROVIDER_SITE_OTHER): Payer: Self-pay | Admitting: Cardiovascular Disease

## 2018-07-01 ENCOUNTER — Other Ambulatory Visit (INDEPENDENT_AMBULATORY_CARE_PROVIDER_SITE_OTHER): Payer: Self-pay | Admitting: Family

## 2018-07-02 ENCOUNTER — Encounter (INDEPENDENT_AMBULATORY_CARE_PROVIDER_SITE_OTHER): Payer: Self-pay | Admitting: Cardiovascular Disease

## 2018-07-02 MED ORDER — ALBUTEROL SULFATE HFA 90 MCG/ACTUATION AEROSOL INHALER
1.0000 | INHALATION_SPRAY | Freq: Four times a day (QID) | RESPIRATORY_TRACT | 2 refills | Status: DC | PRN
Start: 2018-07-02 — End: 2018-08-12

## 2018-07-17 ENCOUNTER — Other Ambulatory Visit (INDEPENDENT_AMBULATORY_CARE_PROVIDER_SITE_OTHER): Payer: Self-pay | Admitting: Family

## 2018-07-17 ENCOUNTER — Encounter (INDEPENDENT_AMBULATORY_CARE_PROVIDER_SITE_OTHER): Payer: Self-pay | Admitting: Family

## 2018-07-17 MED ORDER — METOPROLOL TARTRATE 25 MG TABLET
25.00 mg | ORAL_TABLET | Freq: Two times a day (BID) | ORAL | 2 refills | Status: DC
Start: 2018-07-17 — End: 2018-10-22

## 2018-08-12 ENCOUNTER — Encounter (INDEPENDENT_AMBULATORY_CARE_PROVIDER_SITE_OTHER): Payer: Self-pay | Admitting: Family

## 2018-08-12 ENCOUNTER — Ambulatory Visit: Payer: No Typology Code available for payment source | Attending: Family | Admitting: Family

## 2018-08-12 ENCOUNTER — Ambulatory Visit (INDEPENDENT_AMBULATORY_CARE_PROVIDER_SITE_OTHER): Payer: No Typology Code available for payment source

## 2018-08-12 VITALS — BP 166/86 | HR 71 | Temp 98.1°F | Ht 68.0 in | Wt 330.0 lb

## 2018-08-12 DIAGNOSIS — I10 Essential (primary) hypertension: Secondary | ICD-10-CM | POA: Insufficient documentation

## 2018-08-12 DIAGNOSIS — Z794 Long term (current) use of insulin: Secondary | ICD-10-CM | POA: Insufficient documentation

## 2018-08-12 DIAGNOSIS — R42 Dizziness and giddiness: Secondary | ICD-10-CM | POA: Insufficient documentation

## 2018-08-12 DIAGNOSIS — R5383 Other fatigue: Secondary | ICD-10-CM | POA: Insufficient documentation

## 2018-08-12 DIAGNOSIS — E785 Hyperlipidemia, unspecified: Secondary | ICD-10-CM | POA: Insufficient documentation

## 2018-08-12 DIAGNOSIS — Z6841 Body Mass Index (BMI) 40.0 and over, adult: Secondary | ICD-10-CM | POA: Insufficient documentation

## 2018-08-12 DIAGNOSIS — E119 Type 2 diabetes mellitus without complications: Secondary | ICD-10-CM

## 2018-08-12 LAB — COMPREHENSIVE METABOLIC PANEL, NON-FASTING
ALBUMIN/GLOBULIN RATIO: 1.2 (ref 1.1–2.2)
ALBUMIN: 3.7 g/dL (ref 3.5–5.0)
ALKALINE PHOSPHATASE: 64 U/L (ref 38–126)
ALT (SGPT): 26 U/L (ref 17–63)
ANION GAP: 9 mmol/L (ref 5–15)
AST (SGOT): 18 U/L (ref 15–41)
BILIRUBIN TOTAL: 0.7 mg/dL (ref 0.3–1.2)
BUN/CREA RATIO: 19 (ref 6–20)
BUN: 11 mg/dL (ref 8–26)
CALCIUM: 8.5 mg/dL — ABNORMAL LOW (ref 8.9–10.3)
CHLORIDE: 102 mmol/L (ref 101–111)
CO2 TOTAL: 23 mmol/L (ref 22–32)
CREATININE: 0.57 mg/dL — ABNORMAL LOW (ref 0.90–1.30)
ESTIMATED GFR: >60 mL/min/1.73mˆ2 (ref >60)
GLUCOSE: 258 mg/dL — ABNORMAL HIGH (ref 70–110)
POTASSIUM: 4.1 mmol/L (ref 3.6–5.1)
PROTEIN TOTAL: 6.8 g/dL (ref 6.4–8.3)
SODIUM: 134 mmol/L — ABNORMAL LOW (ref 136–144)

## 2018-08-12 LAB — MAGNESIUM: MAGNESIUM: 1.8 mg/dL (ref 1.8–2.5)

## 2018-08-12 MED ORDER — PROVENTIL HFA 90 MCG/ACTUATION AEROSOL INHALER
1.00 | INHALATION_SPRAY | Freq: Four times a day (QID) | RESPIRATORY_TRACT | 2 refills | Status: AC | PRN
Start: 2018-08-12 — End: 2018-09-11

## 2018-08-12 MED ORDER — DAPAGLIFLOZIN PROPANEDIOL 10 MG TABLET
10.0000 mg | ORAL_TABLET | Freq: Every day | ORAL | 1 refills | Status: AC
Start: 2018-08-12 — End: 2018-09-11

## 2018-08-12 MED ORDER — ROSUVASTATIN 20 MG TABLET
20.00 mg | ORAL_TABLET | Freq: Every evening | ORAL | 1 refills | Status: DC
Start: 2018-08-12 — End: 2018-11-08

## 2018-08-12 MED ORDER — METFORMIN 1,000 MG TABLET
1000.00 mg | ORAL_TABLET | Freq: Two times a day (BID) | ORAL | 4 refills | Status: DC
Start: 2018-08-12 — End: 2019-04-01

## 2018-08-12 MED ORDER — VALSARTAN 320 MG-HYDROCHLOROTHIAZIDE 25 MG TABLET
1.0000 | ORAL_TABLET | Freq: Every day | ORAL | 2 refills | Status: DC
Start: 2018-08-12 — End: 2018-11-28

## 2018-08-12 NOTE — Nursing Note (Signed)
Pt presents today for 3 month follow up. Medications reviewed w patient. Pt would like to discuss Hep C screen, HIV screening and shingles vaccine before ordering. All other health maintenance up to date.

## 2018-08-12 NOTE — Progress Notes (Signed)
[facility]  [address]  [place] [address]  [phone]      Patient Name:  [name]  MRN:  [id number]  DOB:  Nov 24, 1960    Date of Service:  08/12/2018    Chief Complaint:   Chief Complaint   Patient presents with   . Follow Up 3 Months       Subjective:  [name] is a 57 y.o. male who returns to the office for follow up.  He says that he recently changed insurances and some of his medication were not covered so he has not been taking the medication.   PT also says that he has not been on insulin since November.   PT says that his blood glucose has been "really high" and reports 200-300 at home.  He does complain of "being really tired"     Pt has had to reschedule cardiology because of the change in insurance.  He was ill the day he was to have it and he is see cardiology Jan 28th.  If this workup is negative, then he will be seeing neurology again.      Review of Systems:   Constitutional: positive for fatigue  Ears, nose, mouth, throat, and face: positive for nasal congestion  Respiratory: negative  Cardiovascular: positive for palpitations  Integument/breast: negative  Behavioral/Psych: negative  Endocrine: positive for diabetes, hyperlipidemia, obesity  and thyroid disease    Current Outpatient Medications   Medication Sig   . Aspirin 81 mg Oral Tablet take 81 mg by mouth Once a day.   . dapagliflozin (FARXIGA) 10 mg Oral Tablet Take 10 mg by mouth Once a day   . dapagliflozin 10 mg Oral Tablet Take 1 Tab (10 mg total) by mouth Once a day for 30 days   . FLUoxetine (PROZAC) 20 mg Oral Capsule Take 1 Cap (20 mg total) by mouth Once a day for 90 days   . fluticasone-salmeterol (ADVAIR) 250-50 mcg/dose Inhalation Disk with Device oral diskus inhaler Take 1 INHALATION by inhalation Twice daily for 90 days   . glipiZIDE (GLUCOTROL) 10 mg Oral Tablet Take 1 Tab (10 mg total) by mouth Every morning before breakfast Take 30 minutes before meals   . insulin detemir U-100  (LEVEMIR U-100 INSULIN) 100 unit/mL Subcutaneous Solution 35 Units by Subcutaneous route Twice daily for 90 days (Patient taking differently: 38 Units by Subcutaneous route Twice daily )   . insulin lispro (HUMALOG KWIKPEN INSULIN) 100 unit/mL Subcutaneous Insulin Pen Three times a day with meals per sliding scale, up to 36 units a day4   . Metformin (GLUCOPHAGE) 1,000 mg Oral Tablet Take 1,000 mg by mouth Every morning with breakfast    . MetFORMIN (GLUCOPHAGE) 1,000 mg Oral Tablet Take 1 Tab (1,000 mg total) by mouth Twice daily with food   . metoprolol tartrate (LOPRESSOR) 25 mg Oral Tablet Take 1 Tab (25 mg total) by mouth Every 12 hours   . multivitamin Oral Tablet Take 1 Tab by mouth Once a day   . PROVENTIL HFA 90 mcg/actuation Inhalation HFA Aerosol Inhaler Take 1-2 Puffs by inhalation Every 6 hours as needed for up to 30 days   . rosuvastatin (CRESTOR) 20 mg Oral Tablet Take 1 Tab (20 mg total) by mouth Every evening for 30 days   . valsartan-hydroCHLOROthiazide (DIOVAN HCT) 320-25 mg Oral Tablet Take 1 Tab by mouth Once a day for 30 days  Allergies   Allergen Reactions   . Tylox [Oxycodone-Acetaminophen]        Objective:    BP (!) 166/86   Pulse 71   Temp 36.7 C (98.1 F) (Oral)   Ht 1.727 m (_0 )   Wt (!) 149.7 kg (330 lb)   BMI 50.18 kg/m       General:  appears chronically ill and morbidly obese  HENT:  ENMT without erythema or injection, mucous membranes moist.  Lungs:  Clear to auscultation bilaterally.   Cardiovascular:  regular rate and rhythm  Extremities:  No cyanosis or edema  Skin:  Skin warm and dry  Psychiatric:  Normal    Data reviewed:  Last CBC  (Last result in the past 2 years)      WBC   HGB   HCT   MCV   Platelets      05/08/18 1047 7.6 17.4 52.6 87.7 283        Last BMP  (Last result in the past 2 years)      Na   K   Cl   CO2   BUN   Cr   Calcium   Glucose   Glucose-Fasting        05/08/18 1047 137 4.7 96 27 23 1.03 9.4 188         Last Hepatic Panel  (Last result in the  past 2 years)      Albumin   Total PTN   Total Bili   Direct Bili   Ast/SGOT   Alt/SGPT   Alk Phos        05/08/18 1047 4.1 7.6 1._1 54         Lab Results   Component Value Date    TRIG 205 (H) 05/08/2018    TRIG 274 (H) 07/21/2010    HDLCHOL 29 05/08/2018    HDLCHOL 28 (L) 07/21/2010    LDLCHOL 54 05/08/2018    LDLCHOL 79 07/21/2010    CHOLESTEROL 124 05/08/2018    CHOLESTEROL 162 07/21/2010     Lab Results   Component Value Date    HA1C 11.2 (H) 05/08/2018    HA1C 10.5 (H) 09/04/2011     Last Value    TSH   Date Value Ref Range Status   01/29/2018 1.676 0.340 - 5.600 uIU/mL Final        Assessment/Plan:    ICD-10-CM    1. Essential hypertension I10 Comprehensive Metabolic Panel, Non-Fasting     Magnesium     CBC/Diff     Comprehensive Metabolic Panel, Non-Fasting   2. Diabetes mellitus (CMS HCC) E11.9 Comprehensive Metabolic Panel, Non-Fasting     Magnesium     Comprehensive Metabolic Panel, Non-Fasting     Hemoglobin A1C   3. Hyperlipidemia, unspecified hyperlipidemia type E78.5 Comprehensive Metabolic Panel, Non-Fasting     Magnesium     Comprehensive Metabolic Panel, Non-Fasting   4. Morbid obesity (CMS HCC) E66.01 CBC/Diff     Comprehensive Metabolic Panel, Non-Fasting     TSH with Free T4 Reflex   5. Vertigo R42 Comprehensive Metabolic Panel, Non-Fasting     Magnesium   6. Fatigue, unspecified type R53.83 CBC/Diff     TSH with Free T4 Reflex       Orders Placed This Encounter   . CBC/Diff   . Comprehensive Metabolic Panel, Non-Fasting   . Hemoglobin A1C   . TSH with Free T4 Reflex   . PROVENTIL HFA 90  mcg/actuation Inhalation [employer]   . rosuvastatin (CRESTOR) 20 mg Oral Tablet   . dapagliflozin 10 mg Oral Tablet   . MetFORMIN (GLUCOPHAGE) 1,000 mg Oral Tablet   . valsartan-hydroCHLOROthiazide (DIOVAN HCT) 320-25 mg Oral Tablet       Diet Recommended: Healthy Eating - low fat, low simple carbohydrate.  Lots of fresh fruit & vegetables and whole grains.  Lean Meats    Follow up:  Return in  about 6 weeks (around 09/23/2018).      Marveen Reeks, APRN  08/12/2018, 09:22

## 2018-09-05 ENCOUNTER — Telehealth (INDEPENDENT_AMBULATORY_CARE_PROVIDER_SITE_OTHER): Payer: Self-pay | Admitting: Cardiovascular Disease

## 2018-09-05 NOTE — Nursing Note (Signed)
I called the patient and explained that I did get the authorization today and called the hospital to see if they could work him in before the 28th and they are not able to so they scheduled him for 09/30/18 at 7:30. I gave him the instructions and also let him know that we rescheduled his appointment with Dr. Shawnie Dapper for 10/29/18 at 10:30. He verbalized understanding. Jerrol Banana, LPN  10/12/9796, 14:16

## 2018-09-05 NOTE — Telephone Encounter (Addendum)
Late entry: The patient called in stating that he was supposed to have a stress test done a few months ago but got a new insurance and his daughter was supposed to have brought it in and he was wanting to check to make sure we had it because he still has not heard when the stress test is. I looked in his chart and explained that we did have his new insurance scanned in and I was not sure why the test has not been scheduled but I would call the insurance today and start the authorization process and as soon as I got the authorization I would get it scheduled and call him. He states that he is supposed to see Dr. Shawnie Dapper on the 28th and wanted to know if the test would be before that. I explained that I know they are scheduling into February but I would call and see if there is any way that they can work him in once I get the [place]. He said that was fine and stated that if we could not get the test scheduled would it be possible to reschedule the appointment with Dr. Shawnie Dapper for after the stress test. I explained that we sure can and apologized for it taking so long to get the test scheduled. He verbalized understanding and said it was okay. Jerrol Banana, LPN  0/16/0109, 14:12

## 2018-09-16 ENCOUNTER — Ambulatory Visit (INDEPENDENT_AMBULATORY_CARE_PROVIDER_SITE_OTHER): Payer: Self-pay | Admitting: Family

## 2018-09-16 NOTE — Telephone Encounter (Signed)
Regarding: PHARMACY QUESTION  ----- Message from Ranae Plumber Riffle sent at 09/16/2018  9:47 AM EST -----    The pharmacist called back and she did find generic Crestor that Amy had sent in, she will fill that.     ----- Message from Primitivo Gauze sent at 09/16/2018  9:46 AM EST -----  [facility] called and state that the Insurance co has contacted them to try and get Jovita Gamma on a Statin because he is a diabetic. Most recent was from Dr. Park Breed for Lovastatin 40mg 

## 2018-09-17 ENCOUNTER — Encounter (INDEPENDENT_AMBULATORY_CARE_PROVIDER_SITE_OTHER): Payer: Self-pay | Admitting: Cardiovascular Disease

## 2018-09-30 ENCOUNTER — Ambulatory Visit (HOSPITAL_COMMUNITY): Payer: 59

## 2018-09-30 ENCOUNTER — Other Ambulatory Visit: Payer: Self-pay

## 2018-09-30 ENCOUNTER — Ambulatory Visit
Admission: RE | Admit: 2018-09-30 | Discharge: 2018-09-30 | Disposition: A | Discharge status: Home / Self Care | Payer: 59 | Source: Ambulatory Visit | Attending: Cardiovascular Disease | Admitting: Cardiovascular Disease

## 2018-09-30 ENCOUNTER — Ambulatory Visit (HOSPITAL_COMMUNITY)
Admission: RE | Admit: 2018-09-30 | Discharge: 2018-09-30 | Disposition: A | Discharge status: Home / Self Care | Payer: 59 | Source: Ambulatory Visit

## 2018-09-30 DIAGNOSIS — I4729 Other ventricular tachycardia: Secondary | ICD-10-CM

## 2018-09-30 DIAGNOSIS — I472 Ventricular tachycardia: Secondary | ICD-10-CM

## 2018-09-30 DIAGNOSIS — R42 Dizziness and giddiness: Secondary | ICD-10-CM

## 2018-09-30 MED ORDER — REGADENOSON 0.4 MG/5 ML INTRAVENOUS SYRINGE
0.4000 mg | INJECTION | Freq: Once | INTRAVENOUS | Status: DC
Start: 2018-09-30 — End: 2018-10-01

## 2018-09-30 MED ADMIN — regadenoson 0.4 mg/5 mL intravenous syringe: @ 09:00:00

## 2018-10-02 ENCOUNTER — Other Ambulatory Visit: Payer: Self-pay

## 2018-10-02 ENCOUNTER — Ambulatory Visit: Payer: 59 | Attending: Family | Admitting: Family

## 2018-10-02 ENCOUNTER — Encounter (INDEPENDENT_AMBULATORY_CARE_PROVIDER_SITE_OTHER): Payer: Self-pay | Admitting: Family

## 2018-10-02 ENCOUNTER — Ambulatory Visit (INDEPENDENT_AMBULATORY_CARE_PROVIDER_SITE_OTHER): Payer: 59

## 2018-10-02 DIAGNOSIS — E119 Type 2 diabetes mellitus without complications: Secondary | ICD-10-CM | POA: Insufficient documentation

## 2018-10-02 DIAGNOSIS — Z6841 Body Mass Index (BMI) 40.0 and over, adult: Secondary | ICD-10-CM | POA: Insufficient documentation

## 2018-10-02 DIAGNOSIS — I10 Essential (primary) hypertension: Secondary | ICD-10-CM | POA: Insufficient documentation

## 2018-10-02 DIAGNOSIS — R5383 Other fatigue: Secondary | ICD-10-CM | POA: Insufficient documentation

## 2018-10-02 DIAGNOSIS — G4730 Sleep apnea, unspecified: Secondary | ICD-10-CM | POA: Insufficient documentation

## 2018-10-02 DIAGNOSIS — E785 Hyperlipidemia, unspecified: Secondary | ICD-10-CM | POA: Insufficient documentation

## 2018-10-02 LAB — CBC WITH DIFF
BASOPHIL #: <0.1 10*3/uL (ref <=0.20)
BASOPHIL %: 1 %
EOSINOPHIL #: 0.16 x10ˆ3/uL (ref <=0.50)
EOSINOPHIL %: 3 %
HCT: 46.6 % (ref 38.9–52.0)
HGB: 15.6 g/dL (ref 13.4–17.5)
IMMATURE GRANULOCYTE #: <0.1 10*3/uL (ref <0.10)
IMMATURE GRANULOCYTE %: 1 % (ref 0–1)
LYMPHOCYTE #: 2 x10ˆ3/uL (ref 1.00–4.80)
LYMPHOCYTE %: 34 %
MCH: 29.2 pg (ref 26.0–32.0)
MCHC: 33.5 g/dL (ref 31.0–35.5)
MCHC: 33.5 g/dL (ref 31.0–35.5)
MCV: 87.1 fL (ref 78.0–100.0)
MONOCYTE #: 0.48 x10ˆ3/uL (ref 0.20–1.10)
MONOCYTE %: 8 %
MPV: 9.5 fL (ref 8.7–12.5)
NEUTROPHIL #: 3.22 x10ˆ3/uL (ref 1.50–7.70)
NEUTROPHIL %: 53 %
PLATELETS: 204 x10ˆ3/uL (ref 150–400)
RBC: 5.35 x10ˆ6/uL (ref 4.50–6.10)
RDW-CV: 12.3 % (ref 11.5–15.5)
WBC: 6 x10?3/uL (ref 3.7–11.0)
WBC: 6 x10ˆ3/uL (ref 3.7–11.0)

## 2018-10-02 LAB — COMPREHENSIVE METABOLIC PANEL, NON-FASTING
ALBUMIN/GLOBULIN RATIO: 1.2 (ref 1.1–2.2)
ALBUMIN: 3.6 g/dL (ref 3.5–5.0)
ALKALINE PHOSPHATASE: 63 U/L (ref 38–126)
ALT (SGPT): 30 U/L (ref 17–63)
ANION GAP: 8 mmol/L (ref 5–15)
AST (SGOT): 22 U/L (ref 15–41)
BILIRUBIN TOTAL: 0.6 mg/dL (ref 0.3–1.2)
BILIRUBIN TOTAL: 0.6 mg/dL (ref 0.3–1.2)
BUN/CREA RATIO: 25 — ABNORMAL HIGH (ref 6–20)
BUN: 21 mg/dL (ref 8–26)
CALCIUM: 8.8 mg/dL — ABNORMAL LOW (ref 8.9–10.3)
CHLORIDE: 103 mmol/L (ref 101–111)
CO2 TOTAL: 27 mmol/L (ref 22–32)
CREATININE: 0.85 mg/dL — ABNORMAL LOW (ref 0.90–1.30)
ESTIMATED GFR: >60 mL/min/1.73mˆ2 (ref >60)
GLUCOSE: 257 mg/dL — ABNORMAL HIGH (ref 70–110)
POTASSIUM: 3.6 mmol/L (ref 3.6–5.1)
PROTEIN TOTAL: 6.7 g/dL (ref 6.4–8.3)
SODIUM: 138 mmol/L (ref 136–144)

## 2018-10-02 LAB — THYROID STIMULATING HORMONE WITH FREE T4 REFLEX: TSH: 1.494 u[IU]/mL (ref 0.450–5.330)

## 2018-10-02 NOTE — Progress Notes (Signed)
[facility]  [address]  [place] [address]  [phone]      Patient Name:  [name]  MRN:  [id number]  DOB:  August 26, 1960    Date of Service:  10/02/2018    Chief Complaint:   Chief Complaint   Patient presents with   . Follow-up     2 months       Subjective:  [name] is a 58 y.o. male who returns to the office for two month follow up.  He says that he went for the nuclear stress test and was found to "not fit" into the machine.  He is awaiting this to be rescheduled per cardiology.  He says that he continues to have some intermittent dizziness and that he is not driving.     Pt also says that he has not been taking insulin as directed.  He says that the type that he was on in the past is now not covered by his insurance and he has not had any since he ran out of samples.     Review of Systems:   Constitutional: positive for fatigue  Ears, nose, mouth, throat, and face: positive for dizziness  Respiratory: negative  Cardiovascular: positive for palpitations and near-syncope  Integument/breast: negative  Endocrine: positive for diabetes, hyperlipidemia, obesity  and thyroid disease    Current Outpatient Medications   Medication Sig   . Aspirin 81 mg Oral Tablet take 81 mg by mouth Once a day.   . dapagliflozin (FARXIGA) 10 mg Oral Tablet Take 10 mg by mouth Once a day   . FLUoxetine (PROZAC) 20 mg Oral Capsule Take 1 Cap (20 mg total) by mouth Once a day for 90 days   . fluticasone-salmeterol (ADVAIR) 250-50 mcg/dose Inhalation Disk with Device oral diskus inhaler Take 1 INHALATION by inhalation Twice daily for 90 days   . furosemide (LASIX) 40 mg Oral Tablet TAKE ONE TABLET BY MOUTH ONCE DAILY FOR 90 DAYS   . glipiZIDE (GLUCOTROL) 10 mg Oral Tablet Take 1 Tab (10 mg total) by mouth Every morning before breakfast Take 30 minutes before meals   . insulin glargine U-300 conc (TOUJEO SOLOSTAR U-300 INSULIN) 300 unit/mL (1.5 mL) Subcutaneous Insulin Pen 20 Units by  Subcutaneous route Once a day for 30 days   . insulin lispro (HUMALOG KWIKPEN INSULIN) 100 unit/mL Subcutaneous Insulin Pen Three times a day with meals per sliding scale, up to 36 units a day4   . Metformin (GLUCOPHAGE) 1,000 mg Oral Tablet Take 1,000 mg by mouth Every morning with breakfast    . MetFORMIN (GLUCOPHAGE) 1,000 mg Oral Tablet Take 1 Tab (1,000 mg total) by mouth Twice daily with food   . metoprolol tartrate (LOPRESSOR) 25 mg Oral Tablet Take 1 Tab (25 mg total) by mouth Every 12 hours   . multivitamin Oral Tablet Take 1 Tab by mouth Once a day   . rosuvastatin (CRESTOR) 20 mg Oral Tablet Take 1 Tab (20 mg total) by mouth Every evening for 30 days   . valsartan-hydroCHLOROthiazide (DIOVAN HCT) 320-25 mg Oral Tablet Take 1 Tab by mouth Once a day for 30 days       Allergies   Allergen Reactions   . Tylox [Oxycodone-Acetaminophen]        Objective:    BP (!) 143/85   Pulse 78   Temp 36.7 C (98 F) (Oral)   Ht 1.727 m ('5\' 8"'$ )   Wt Marland Kitchen)  147.6 kg (325 lb 8 oz)   BMI 49.49 kg/m       General:  appears chronically ill and morbidly obese  HENT:  ENMT without erythema or injection, mucous membranes moist.  Lungs:  Clear to auscultation bilaterally.   Cardiovascular:  regular rate and rhythm  Extremities:  trace pedal edema  Psychiatric:  Normal    Data reviewed:  Last CBC  (Last result in the past 2 years)      WBC   HGB   HCT   MCV   Platelets      10/02/18 0851 6.0 15.6 46.6 87.1 204        Last BMP  (Last result in the past 2 years)      Na   K   Cl   CO2   BUN   Cr   Calcium   Glucose   Glucose-Fasting        10/02/18 0851 138 3.6 103 27 21 0.85 8.8 257         Last Hepatic Panel  (Last result in the past 2 years)      Albumin   Total PTN   Total Bili   Direct Bili   Ast/SGOT   Alt/SGPT   Alk Phos        10/02/18 0851 3.6 6.7 0.'6   22 30 '$ 63         Last Lipid Panel  (Last result in the past 2 years)      Cholesterol   HDL   LDL   Direct LDL   Triglycerides      05/08/18 1047 124 29 54   205        Lab  Results   Component Value Date    HA1C 12.5 (H) 10/02/2018    HA1C 10.5 (H) 09/04/2011     Assessment/Plan:    ICD-10-CM    1. Morbid obesity (CMS HCC) E66.01 CBC/Diff     Comprehensive Metabolic Panel, Non-Fasting     TSH with Free T4 Reflex     CBC WITH DIFF   2. Essential hypertension I10 CBC/Diff     Comprehensive Metabolic Panel, Non-Fasting     CBC WITH DIFF   3. Diabetes mellitus (CMS HCC) E11.9 Comprehensive Metabolic Panel, Non-Fasting     Hemoglobin A1C   4. Hyperlipidemia, unspecified hyperlipidemia type E78.5 Comprehensive Metabolic Panel, Non-Fasting   5. Sleep apnea, unspecified type G47.30    6. Fatigue, unspecified type R53.83 CBC/Diff     TSH with Free T4 Reflex     CBC WITH DIFF       Orders Placed This Encounter   . CBC WITH DIFF       Diet Recommended: Healthy Eating - low fat, low simple carbohydrate.  Lots of fresh fruit & vegetables and whole grains.  Lean Meats    Follow up:  No follow-ups on file.      Marveen Reeks, APRN  10/02/2018, 08:41

## 2018-10-02 NOTE — Nursing Note (Signed)
Pt presents today for 2 month follow up on hypertension. Pt reports that bp has been up and he has been feeling down. Pt will go to pharmacy for Shingles vaccination. Reports that he has previously completed HIV and Hep C screenings and was negative. All other health maintenance up to date at this time.

## 2018-10-03 ENCOUNTER — Encounter (INDEPENDENT_AMBULATORY_CARE_PROVIDER_SITE_OTHER): Payer: Self-pay | Admitting: Family

## 2018-10-04 ENCOUNTER — Other Ambulatory Visit (INDEPENDENT_AMBULATORY_CARE_PROVIDER_SITE_OTHER): Payer: Self-pay | Admitting: Family

## 2018-10-04 MED ORDER — INSULIN LISPRO (U-100) 100 UNIT/ML SUBCUTANEOUS PEN: Syringe | SUBCUTANEOUS | 2 refills | 0 days | Status: AC

## 2018-10-04 MED ORDER — TOUJEO SOLOSTAR U-300 INSULIN 300 UNIT/ML (1.5 ML) SUBCUTANEOUS PEN
20.0000 [IU] | PEN_INJECTOR | Freq: Every day | SUBCUTANEOUS | 2 refills | Status: DC
Start: 2018-10-04 — End: 2019-07-02

## 2018-10-17 ENCOUNTER — Ambulatory Visit (HOSPITAL_COMMUNITY): Payer: 59

## 2018-10-22 ENCOUNTER — Other Ambulatory Visit (INDEPENDENT_AMBULATORY_CARE_PROVIDER_SITE_OTHER): Payer: Self-pay | Admitting: Family

## 2018-10-22 DIAGNOSIS — F39 Unspecified mood [affective] disorder: Secondary | ICD-10-CM

## 2018-10-22 MED ORDER — METOPROLOL TARTRATE 25 MG TABLET
25.0000 mg | ORAL_TABLET | Freq: Two times a day (BID) | ORAL | 1 refills | Status: AC
Start: 2018-10-22 — End: 2023-10-29

## 2018-10-22 MED ORDER — FLUOXETINE 20 MG CAPSULE
20.0000 mg | ORAL_CAPSULE | Freq: Every day | ORAL | 1 refills | Status: DC
Start: 2018-10-22 — End: 2019-04-01

## 2018-10-29 ENCOUNTER — Encounter (INDEPENDENT_AMBULATORY_CARE_PROVIDER_SITE_OTHER): Payer: Self-pay | Admitting: Cardiovascular Disease

## 2018-10-30 ENCOUNTER — Other Ambulatory Visit: Payer: Self-pay

## 2018-10-30 ENCOUNTER — Ambulatory Visit
Admission: RE | Admit: 2018-10-30 | Discharge: 2018-10-30 | Disposition: A | Discharge status: Home / Self Care | Payer: 59 | Source: Ambulatory Visit | Attending: Family Medicine | Admitting: Family Medicine

## 2018-10-30 ENCOUNTER — Ambulatory Visit (HOSPITAL_COMMUNITY): Payer: 59

## 2018-10-30 DIAGNOSIS — R42 Dizziness and giddiness: Secondary | ICD-10-CM | POA: Insufficient documentation

## 2018-10-30 DIAGNOSIS — I472 Ventricular tachycardia: Secondary | ICD-10-CM | POA: Insufficient documentation

## 2018-10-30 LAB — MYOCARDIAL PERFUSION COMPLETE: EF: 62 — NL

## 2018-10-30 MED ORDER — REGADENOSON 0.4 MG/5 ML INTRAVENOUS SYRINGE
0.40 mg | INJECTION | INTRAVENOUS | Status: AC
Start: 2018-10-30 — End: 2018-10-30
  Administered 2018-10-30: 0.4 mg via INTRAVENOUS

## 2018-11-05 ENCOUNTER — Ambulatory Visit (INDEPENDENT_AMBULATORY_CARE_PROVIDER_SITE_OTHER): Payer: Self-pay | Admitting: Family

## 2018-11-08 ENCOUNTER — Other Ambulatory Visit (INDEPENDENT_AMBULATORY_CARE_PROVIDER_SITE_OTHER): Payer: Self-pay | Admitting: Family

## 2018-11-08 MED ORDER — ROSUVASTATIN 20 MG TABLET
20.0000 mg | ORAL_TABLET | Freq: Every evening | ORAL | 1 refills | Status: DC
Start: 2018-11-08 — End: 2023-10-12

## 2018-11-11 ENCOUNTER — Telehealth (INDEPENDENT_AMBULATORY_CARE_PROVIDER_SITE_OTHER): Payer: Self-pay | Admitting: PHYSICIAN ASSISTANT

## 2018-11-11 NOTE — Telephone Encounter (Signed)
Phoned patient to discuss his upcoming appointment with Dr. Shawnie Dapper on 11/12/2018.  The patient is having no symptoms of fever, cough, or shortness of breath.  I instructed him that if he does develop those symptoms to call the [place] hotline for COVID-19 at [phone]. The patient is currently not having any active cardiac complaints.  Offered the patient a Telemedicine visit, which he is agreeable to in lieu of a physical appointment.    Violet Baldy, PA-C  11/11/2018, 16:28

## 2018-11-12 ENCOUNTER — Telehealth (INDEPENDENT_AMBULATORY_CARE_PROVIDER_SITE_OTHER): Payer: Self-pay | Admitting: Cardiovascular Disease

## 2018-11-12 ENCOUNTER — Encounter (INDEPENDENT_AMBULATORY_CARE_PROVIDER_SITE_OTHER): Payer: Self-pay | Admitting: Family

## 2018-11-12 ENCOUNTER — Encounter (INDEPENDENT_AMBULATORY_CARE_PROVIDER_SITE_OTHER): Payer: Self-pay | Admitting: Cardiovascular Disease

## 2018-11-12 NOTE — Progress Notes (Signed)
[facility]Y, [facility]  [address]  [place] [address]  Phone: [phone]  Fax: [phone]    Encounter Date: 11/12/2018    Patient ID:  [name]  UXN:[id number]    DOB: [birth year]  Age: 58 y.o. male    Subjective:     Chief Complaint   Patient presents with   . Dizziness     HPI:  This is a telemedicine visit today.  Patient had a video checked with me.  We had seen him for chest pain, as well as dizziness and near-syncope.  His cardiac workup has been completed which included an event monitor that did show some short runs of nonsustained V-tach.  He had an echo fairly unremarkable.  We wound up doing a nuclear stress test that also looked normal.  He says he has not really had much more in the way of chest pain but is still having those episodes of near syncope.  He says they can occur couple of times a week, he feels dizzy like he might pass out, any sometimes has to go sit or lay down for 10 or 15 minutes before the symptoms resolved.  He has not really noticed much in the way of any palpitations.  He has not had any complete loss consciousness.  He does have some shortness of breath with activity but seems to be able to do most normal activities otherwise.  Current Outpatient Medications   Medication Sig   . Aspirin 81 mg Oral Tablet take 81 mg by mouth Once a day.   . dapagliflozin (FARXIGA) 10 mg Oral Tablet Take 10 mg by mouth Once a day   . FLUoxetine (PROZAC) 20 mg Oral Capsule Take 1 Cap (20 mg total) by mouth Once a day for 90 days   . fluticasone-salmeterol (ADVAIR) 250-50 mcg/dose Inhalation Disk with Device oral diskus inhaler Take 1 INHALATION by inhalation Twice daily for 90 days   . furosemide (LASIX) 40 mg Oral Tablet TAKE ONE TABLET BY MOUTH ONCE DAILY FOR 90 DAYS   . glipiZIDE (GLUCOTROL) 10 mg Oral Tablet Take 1 Tab (10 mg total) by mouth Every morning before breakfast Take 30 minutes before meals   . insulin glargine U-300 conc (TOUJEO SOLOSTAR U-300  INSULIN) 300 unit/mL (1.5 mL) Subcutaneous Insulin Pen 20 Units by Subcutaneous route Once a day for 30 days   . insulin lispro (HUMALOG KWIKPEN INSULIN) 100 unit/mL Subcutaneous Insulin Pen Three times a day with meals per sliding scale, up to 36 units a day4   . Metformin (GLUCOPHAGE) 1,000 mg Oral Tablet Take 1,000 mg by mouth Every morning with breakfast    . MetFORMIN (GLUCOPHAGE) 1,000 mg Oral Tablet Take 1 Tab (1,000 mg total) by mouth Twice daily with food   . metoprolol tartrate (LOPRESSOR) 25 mg Oral Tablet Take 1 Tab (25 mg total) by mouth Every 12 hours for 90 days   . multivitamin Oral Tablet Take 1 Tab by mouth Once a day   . rosuvastatin (CRESTOR) 20 mg Oral Tablet Take 1 Tab (20 mg total) by mouth Every evening for 90 days   . valsartan-hydroCHLOROthiazide (DIOVAN HCT) 320-25 mg Oral Tablet Take 1 Tab by mouth Once a day for 30 days     Above medications were not reviewed in detail today but he reports no changes since he last saw [place].  Allergies   Allergen Reactions   . Tylox [Oxycodone-Acetaminophen]      Past Medical History:  Diagnosis Date   . Ankle injury    . Cellulitis 2013   . CPAP (continuous positive airway pressure) dependence    . Diabetes    . HTN    . Knee injury    . Morbid obesity (CMS HCC)    . Nerve damage    . Sleep apnea          Past Surgical History:   Procedure Laterality Date   . COLONOSCOPY  11/22/2017    Dr Georgian Co   . HX CATARACT REMOVAL     . HX HERNIA REPAIR      Bilateral   . HX HERNIA REPAIR     . HX HERNIA REPAIR      inguinal and umbilical repair    . HX ORCHIOPEXY     . HX TONSILLECTOMY     . HX WISDOM TEETH EXTRACTION           Family Medical History:     Problem Relation (Age of Onset)    Cancer Mother    Congestive Heart Failure Maternal Grandmother    Diabetes Paternal Uncle            Social History     Tobacco Use   . Smoking status: Never Smoker   . Smokeless tobacco: Never Used   Substance Use Topics   . Alcohol use: No   . Drug use: No       Review of  Systems  Objective:   Vitals: There were no vitals taken for this visit.        Physical Exam:  this was a telemedicine visit so there was no exam.    Assessment & Plan:     ENCOUNTER DIAGNOSES     ICD-10-CM   1. Near syncope R55   2. Chest pain R07.9   3. HTN (hypertension) I10     Plan:  I reassured him that I think he has had a fairly extensive cardiac workup and we have not found any significant abnormalities.  I suggest he get back to his PCP and his neurologist.  He will continue his current medications.  I told him he should call us if no etiology is found for his problem any continues to have symptoms we would certainly be glad to re-evaluate him but at this point do not recommend any additional cardiac workup.  We will see him back again if needed.  No orders of the defined types were placed in this encounter.      No follow-ups on file.    Mariea Stable, MD

## 2018-11-13 ENCOUNTER — Ambulatory Visit (INDEPENDENT_AMBULATORY_CARE_PROVIDER_SITE_OTHER): Payer: Self-pay | Admitting: Family

## 2018-11-25 ENCOUNTER — Ambulatory Visit (INDEPENDENT_AMBULATORY_CARE_PROVIDER_SITE_OTHER): Payer: Self-pay | Admitting: Family

## 2018-11-25 ENCOUNTER — Other Ambulatory Visit (INDEPENDENT_AMBULATORY_CARE_PROVIDER_SITE_OTHER): Payer: Self-pay | Admitting: Family

## 2018-11-25 MED ORDER — ALBUTEROL SULFATE HFA 90 MCG/ACTUATION AEROSOL INHALER
1.0000 | INHALATION_SPRAY | Freq: Four times a day (QID) | RESPIRATORY_TRACT | 3 refills | Status: DC | PRN
Start: 2018-11-25 — End: 2023-10-09

## 2018-11-25 NOTE — Telephone Encounter (Signed)
-----   Message from Willene Hatchet sent at 11/25/2018 12:47 PM EDT -----  Huntley Dec AT [facility] CALLED AND PATIENT IS USING His RESCUE INHALER A LOT AND PHARMACIST WOULD LIKE TO KLNOW IF [name] WOULD LIKE TO SWITCH TO EITHER BUDESAMIDE,QVAR OR ASTHMANEX. PATIENTS INSURANCE WILL COVER ONE OF THESE BUT SARA WAS NOT SURE WHICH ONE SAID TO PUT ON THE PRESCRIPTION SUBSTITUTE WHICH EVER ONE INSURANCE WILL COVER. ALSO THEY CAN'T BREAK BOXES FOR INSULIN ANYMORE SO She WILL NEED A NEW PRESCRIPTION FOR FULL BOX OF TUJAYO AND HUMALOG. ANY QUESTIONS SARA CAN BE REACHED AT [phone]

## 2018-11-25 NOTE — Telephone Encounter (Signed)
Per pharmacy, pt needs new rx for Pro Air instead of Proventil.

## 2018-11-26 MED ORDER — FARXIGA 10 MG TABLET
10.0000 mg | ORAL_TABLET | Freq: Every day | ORAL | 2 refills | Status: AC
Start: 2018-11-26 — End: 2019-02-24

## 2018-11-26 NOTE — Telephone Encounter (Signed)
Please call and discuss with patient.  He is supposed to be on Advair daily as a maintenance.  Is he taking this?  How often is he using rescue?

## 2018-11-26 NOTE — Telephone Encounter (Signed)
Per pt, he is using the Advair daily. The pharmacy asked him if he was using his rescue inhaler more then 2x weekly and he stated "sometimes yes, sometimes no" Pt explained that if it isn't allergy season he may use it 0-1 x weekly. During allergy season he may use it 0 to 5 or more x times weekly. Pharmacist told pt that insurance wants him on a steroid if he uses rescue inhaler more than 2x a week.

## 2018-11-26 NOTE — Telephone Encounter (Signed)
LM for pt to return our call.

## 2018-11-28 ENCOUNTER — Other Ambulatory Visit (INDEPENDENT_AMBULATORY_CARE_PROVIDER_SITE_OTHER): Payer: Self-pay | Admitting: Family

## 2018-12-02 ENCOUNTER — Other Ambulatory Visit (INDEPENDENT_AMBULATORY_CARE_PROVIDER_SITE_OTHER): Payer: Self-pay | Admitting: Family

## 2018-12-02 MED ORDER — VALSARTAN 320 MG-HYDROCHLOROTHIAZIDE 25 MG TABLET
1.0000 | ORAL_TABLET | Freq: Every day | ORAL | 1 refills | Status: DC
Start: 2018-12-02 — End: 2019-03-17

## 2018-12-03 MED ORDER — FUROSEMIDE 40 MG TABLET
ORAL_TABLET | ORAL | 1 refills | Status: AC
Start: 2018-12-03 — End: ?

## 2018-12-04 ENCOUNTER — Other Ambulatory Visit (INDEPENDENT_AMBULATORY_CARE_PROVIDER_SITE_OTHER): Payer: Self-pay | Admitting: Family

## 2018-12-04 MED ORDER — INSULIN LISPRO (U-100) 100 UNIT/ML SUBCUTANEOUS PEN
PEN_INJECTOR | SUBCUTANEOUS | 2 refills | Status: DC
Start: 2018-12-04 — End: 2019-03-06

## 2018-12-04 NOTE — Telephone Encounter (Signed)
Per [facility] need new RX for 5 Humalog pens instead of 4. They come 5 to a box and they are no longer allowed to split boxes.

## 2019-01-06 ENCOUNTER — Other Ambulatory Visit (INDEPENDENT_AMBULATORY_CARE_PROVIDER_SITE_OTHER): Payer: Self-pay | Admitting: Family

## 2019-01-06 MED ORDER — GLIPIZIDE 10 MG TABLET
10.00 mg | ORAL_TABLET | Freq: Every morning | ORAL | 3 refills | Status: DC
Start: 2019-01-06 — End: 2019-07-02

## 2019-01-20 ENCOUNTER — Encounter (INDEPENDENT_AMBULATORY_CARE_PROVIDER_SITE_OTHER): Payer: Self-pay | Admitting: Family

## 2019-03-06 ENCOUNTER — Other Ambulatory Visit (INDEPENDENT_AMBULATORY_CARE_PROVIDER_SITE_OTHER): Payer: Self-pay | Admitting: Family

## 2019-03-07 ENCOUNTER — Ambulatory Visit (INDEPENDENT_AMBULATORY_CARE_PROVIDER_SITE_OTHER): Payer: Self-pay | Admitting: Family

## 2019-03-07 MED ORDER — INSULIN LISPRO (U-100) 100 UNIT/ML SUBCUTANEOUS PEN
PEN_INJECTOR | SUBCUTANEOUS | 2 refills | Status: DC
Start: 2019-03-07 — End: 2022-05-04

## 2019-03-07 NOTE — Telephone Encounter (Signed)
Spoke with Lexine Baton at pharmacy. Clarification given.

## 2019-03-07 NOTE — Telephone Encounter (Signed)
-----   Message from Olegario Shearer sent at 03/07/2019 10:53 AM EDT -----  Lexine Baton FROM [facility] IN [place] HAS A QUESTION ABOUT A MEDICATION REQUEST THAT CAME IN TO THEM. NIKKI CAN BE REACHED AT [phone]

## 2019-03-11 ENCOUNTER — Ambulatory Visit: Payer: 59 | Attending: Ophthalmology | Admitting: Ophthalmology

## 2019-03-11 ENCOUNTER — Other Ambulatory Visit: Payer: Self-pay

## 2019-03-11 ENCOUNTER — Encounter (INDEPENDENT_AMBULATORY_CARE_PROVIDER_SITE_OTHER): Payer: Self-pay | Admitting: Ophthalmology

## 2019-03-11 DIAGNOSIS — Z961 Presence of intraocular lens: Secondary | ICD-10-CM

## 2019-03-11 DIAGNOSIS — H25042 Posterior subcapsular polar age-related cataract, left eye: Secondary | ICD-10-CM

## 2019-03-11 DIAGNOSIS — H2512 Age-related nuclear cataract, left eye: Secondary | ICD-10-CM

## 2019-03-11 MED ORDER — PREDNISOLONE ACETATE 1 % EYE DROPS,SUSPENSION
1.00 [drp] | Freq: Four times a day (QID) | OPHTHALMIC | 1 refills | Status: DC
Start: 2019-03-11 — End: 2019-04-01

## 2019-03-11 MED ORDER — KETOROLAC 0.5 % EYE DROPS
1.00 [drp] | Freq: Two times a day (BID) | OPHTHALMIC | 1 refills | Status: DC
Start: 2019-03-11 — End: 2019-07-02

## 2019-03-11 MED ORDER — OFLOXACIN 0.3 % EYE DROPS
1.00 [drp] | Freq: Four times a day (QID) | OPHTHALMIC | 0 refills | Status: DC
Start: 2019-03-11 — End: 2019-04-08

## 2019-03-11 NOTE — Progress Notes (Addendum)
[facility]THALMOLOGY[facility]  [place] [id number]           Patient Name: [name]  MRN#: [id number]  Birthdate: 07/07/1961    Date of Service: 03/11/2019    Chief Complaint     Cataract          [name] is a 58 y.o. male who presents today for evaluation/consultation of:  HPI     Cataract     In left eye.  Associated symptoms include blurred vision and glare.  Duration of years.  Context:  reading.  Since onset it is gradually worsening.              Comments     Referred by Dr. Vertell Novak for cataract evaluation OS, had CE OD 2007 by Dr. Tobie Poet. Pt had a bad experience with surgery OD, did not feel he had enough sedation during the procedure and became combative. Pt does not like anything touching his eyes. Uses Visine allergy prn OU. Diabetic, sugar yesterday was 150. Has blood work this week with A1C.          Last edited by Mariane Duval, [place] on 03/11/2019 10:29 AM. (History)        ROS     Negative for: Constitutional, Gastrointestinal, Neurological, Skin, Genitourinary, Musculoskeletal, HENT, Endocrine, Cardiovascular, Eyes, Respiratory, Psychiatric, Allergic/Imm, Heme/Lymph    Last edited by Mariane Duval, [place] on 03/11/2019 10:29 AM. (History)         All other systems Negative    Mariane Duval, COA 03/11/2019, 10:37       Past Surgical History:   Procedure Laterality Date   . COLONOSCOPY  11/22/2017    Dr Addison Bailey   . HX CATARACT REMOVAL     . HX HERNIA REPAIR      Bilateral   . HX HERNIA REPAIR     . HX HERNIA REPAIR      inguinal and umbilical repair    . HX ORCHIOPEXY     . HX TONSILLECTOMY     . HX WISDOM TEETH EXTRACTION             Past Medical History:   Diagnosis Date   . Ankle injury    . Cellulitis 2013   . CPAP (continuous positive airway pressure) dependence    . Diabetes    . HTN    . Knee injury    . Morbid obesity (CMS [place])    . Nerve damage    . Sleep apnea            Patient Active Problem List   Diagnosis   . Chest pain   . HTN (hypertension)   .  Diabetes mellitus (CMS [place])   . Hyperlipidemia   . Morbid obesity (CMS [place])   . Sleep apnea   . Cellulitis, scrotum   . Sleep apnea       Family History:  Family Medical History:     Problem Relation (Age of Onset)    Cancer Mother    Congestive Heart Failure Maternal Grandmother    Diabetes Paternal Uncle              Social History:     Social History     Tobacco Use   . Smoking status: Never Smoker   . Smokeless tobacco: Never Used   Substance Use Topics   . Alcohol use: No       MD Addition to  HPI: Pt referred by Dr. Vertell Novak for left eye cataract.  Pt states left eye vision has gotten a lot worse in the past year.  States he had right eye cataract surgery in 2007 by Dr. Tobie Poet and was not sedated enough and became combative.  Pt is anxious about things touching his eyes.    Current Eyedrops: None           Assessment:    1. 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 Nuclear senile cataract of left eye    3. Pseudophakia of right eye        Ophthalmic Plan of Care:    1) nuclear sclerosis, PSC OS  - Visually-significant cataract left eye(s)  - Plan for cataract surgery OS. (OS: SN60WF 21.0)  - R/B/A of phaco/PCIOL left eye discussed with patient, and patient elects to proceed with surgery.  Informed consent obtained today.  - Discussed with patient regarding lens implant options, including standard lens implant, Toric lens implant, multifocal lens implant, and extended depth of focus lens implants, and patient elects to have a: standard lens implant.    - Pt elects to have a lens implant that provides clearer distance vision.  Pt understands that spectacles may still be needed for best corrected vision post-operatively.  - Pt understands that best post-op vision may be limited by pre-existing retinal or optic nerve pathology, which may be poorly visualized pre-operatively due to density of patient's cataract.  - Start Acular BID left eye 1 day prior to surgery.  - Start Ocuflox QID left eye 1 day  prior to surgery.  - IOL Master obtained today.      2) pseudophakia OD  - IOL in good position  - no PCO    The patient's impairment of visual function is believed not to be correctable with a tolerable change in glasses or contact lenses.     Cataract (in the operative eye) is believed to be significantly contributing to the patient's visual impairment.      Risks and benefits of cataract surgery reviewed with the patient including the risk of vision loss from infection or intraocular bleeding.  The increased risk of retinal detachment following cataract surgery was discussed.  Patient understands the risks and benefits and is agreeable to proceed.     There is a reasonable expectation that lens surgery will significantly improve both the visual and functional status of the patient.  The patient desires surgical correction.        Surgical Considerations:  Claustrophobic: NO  Able to lay flat: YES  Recent MI:  No  Recent CVA: No  Flomax: No  Blood thinners: ASA 81 mg (instructed pt to stay on this med)  Diabetic: YES (on insulin)  Dilates to: 7.5 mm  Previous refractive surgery: None; s/p cataract surgery OD    SURGERY  Procedure: phaco/PCIOL OS    Diagnosis:     ICD-10-CM    1. 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 Nuclear senile cataract of left eye H25.12    3. Pseudophakia of right eye Z96.1        Anesthesia: GENERAL (pt was combative during last cataract surgery under MAC)    Length of time: 30 minutes    Special Needs: Plan for SN60WF +21.0; plan for surgery at [facility] in [place]; no co-management with Dr. Vertell Novak          Follow up:  I have asked DEWIGHT CATINO to follow up: schedule phaco/PCIOL OS Billie Ruddy).         Candida Peeling, MD 03/11/2019, 11:33  Assistant Professor  [facility], Department of Ophthalmology    I have seen and examined the above patient. I discussed the above diagnoses listed in the assessment and the above ophthalmic plan of  care with the patient and patient's family. All questions were answered. I reviewed and, when necessary, made changes to the technician/resident note, documented ophthalmology exam, chief complaint, history of present illness, allergies, review of systems, past medical, past surgical, family and social history.    Orders Placed This Encounter   Procedures   . OPH IOL MASTER       Orders Placed This Encounter   . ketorolac (ACULAR) 0.5 % Ophthalmic Drops   . prednisoLONE acetate (PRED FORTE) 1 % Ophthalmic Drops, Suspension   . ofloxacin (OCUFLOX) 0.3 % Ophthalmic Drops

## 2019-03-17 ENCOUNTER — Other Ambulatory Visit (INDEPENDENT_AMBULATORY_CARE_PROVIDER_SITE_OTHER): Payer: Self-pay | Admitting: Family

## 2019-03-18 ENCOUNTER — Other Ambulatory Visit (INDEPENDENT_AMBULATORY_CARE_PROVIDER_SITE_OTHER): Payer: Self-pay | Admitting: Family

## 2019-03-21 MED ORDER — VALSARTAN 320 MG-HYDROCHLOROTHIAZIDE 25 MG TABLET
1.0000 | ORAL_TABLET | Freq: Every day | ORAL | 0 refills | Status: AC
Start: 2019-03-21 — End: 2023-10-29

## 2019-03-26 ENCOUNTER — Other Ambulatory Visit: Payer: Self-pay

## 2019-03-26 ENCOUNTER — Encounter (HOSPITAL_COMMUNITY): Payer: Self-pay

## 2019-03-26 ENCOUNTER — Inpatient Hospital Stay (HOSPITAL_COMMUNITY)
Admission: RE | Admit: 2019-03-26 | Discharge: 2019-03-26 | Disposition: A | Discharge status: Home / Self Care | Payer: 59 | Source: Ambulatory Visit

## 2019-03-26 HISTORY — DX: Type 2 diabetes mellitus without complications: E11.9

## 2019-03-26 HISTORY — DX: Presence of spectacles and contact lenses: Z97.3

## 2019-03-26 HISTORY — DX: Gastro-esophageal reflux disease without esophagitis: K21.9

## 2019-03-26 HISTORY — DX: Dizziness and giddiness: R42

## 2019-04-01 ENCOUNTER — Ambulatory Visit (HOSPITAL_BASED_OUTPATIENT_CLINIC_OR_DEPARTMENT_OTHER): Payer: 59 | Admitting: Certified Registered"

## 2019-04-01 ENCOUNTER — Ambulatory Visit (HOSPITAL_COMMUNITY): Payer: 59 | Admitting: Certified Registered"

## 2019-04-01 ENCOUNTER — Other Ambulatory Visit: Payer: Self-pay

## 2019-04-01 ENCOUNTER — Encounter (HOSPITAL_COMMUNITY)
Admission: RE | Disposition: A | Discharge status: Home / Self Care | Payer: Self-pay | Source: Ambulatory Visit | Attending: Ophthalmology

## 2019-04-01 ENCOUNTER — Encounter (HOSPITAL_COMMUNITY): Payer: Self-pay

## 2019-04-01 ENCOUNTER — Ambulatory Visit (HOSPITAL_COMMUNITY): Payer: 59 | Admitting: Ophthalmology

## 2019-04-01 ENCOUNTER — Inpatient Hospital Stay
Admission: RE | Admit: 2019-04-01 | Discharge: 2019-04-01 | Disposition: A | Discharge status: Home / Self Care | Payer: 59 | Source: Ambulatory Visit | Attending: Ophthalmology | Admitting: Ophthalmology

## 2019-04-01 DIAGNOSIS — Z794 Long term (current) use of insulin: Secondary | ICD-10-CM | POA: Insufficient documentation

## 2019-04-01 DIAGNOSIS — H269 Unspecified cataract: Secondary | ICD-10-CM

## 2019-04-01 DIAGNOSIS — H25812 Combined forms of age-related cataract, left eye: Secondary | ICD-10-CM | POA: Insufficient documentation

## 2019-04-01 DIAGNOSIS — E1136 Type 2 diabetes mellitus with diabetic cataract: Secondary | ICD-10-CM | POA: Insufficient documentation

## 2019-04-01 DIAGNOSIS — I10 Essential (primary) hypertension: Secondary | ICD-10-CM | POA: Insufficient documentation

## 2019-04-01 DIAGNOSIS — Z9989 Dependence on other enabling machines and devices: Secondary | ICD-10-CM | POA: Insufficient documentation

## 2019-04-01 DIAGNOSIS — K219 Gastro-esophageal reflux disease without esophagitis: Secondary | ICD-10-CM | POA: Insufficient documentation

## 2019-04-01 DIAGNOSIS — Z79899 Other long term (current) drug therapy: Secondary | ICD-10-CM | POA: Insufficient documentation

## 2019-04-01 DIAGNOSIS — H2512 Age-related nuclear cataract, left eye: Secondary | ICD-10-CM

## 2019-04-01 DIAGNOSIS — G4730 Sleep apnea, unspecified: Secondary | ICD-10-CM | POA: Insufficient documentation

## 2019-04-01 DIAGNOSIS — E785 Hyperlipidemia, unspecified: Secondary | ICD-10-CM | POA: Insufficient documentation

## 2019-04-01 DIAGNOSIS — Z6841 Body Mass Index (BMI) 40.0 and over, adult: Secondary | ICD-10-CM | POA: Insufficient documentation

## 2019-04-01 DIAGNOSIS — Z7982 Long term (current) use of aspirin: Secondary | ICD-10-CM | POA: Insufficient documentation

## 2019-04-01 HISTORY — PX: HX CATARACT REMOVAL: SHX102

## 2019-04-01 LAB — POC BLOOD GLUCOSE (RESULTS)
GLUCOSE, POC: 266 mg/dL — ABNORMAL HIGH (ref 70–105)
GLUCOSE, POC: 253 mg/dL — ABNORMAL HIGH (ref 70–105)

## 2019-04-01 SURGERY — PHACO WITH INTRAOCULAR LENS
Anesthesia: General | Laterality: Left | Wound class: Clean Wound: Uninfected operative wounds in which no inflammation occurred

## 2019-04-01 MED ORDER — LACTATED RINGERS INTRAVENOUS SOLUTION
INTRAVENOUS | Status: DC
Start: 2019-04-01 — End: 2019-04-01

## 2019-04-01 MED ORDER — FENTANYL (PF) 50 MCG/ML INJECTION SOLUTION
Freq: Once | INTRAMUSCULAR | Status: DC | PRN
Start: 2019-04-01 — End: 2019-04-01
  Administered 2019-04-01: 50 ug via INTRAVENOUS

## 2019-04-01 MED ORDER — LIDOCAINE (PF) 10 MG/ML (1 %) INJECTION SOLUTION
0.5000 mL | Freq: Once | INTRAMUSCULAR | Status: DC | PRN
Start: 2019-04-01 — End: 2019-04-01

## 2019-04-01 MED ORDER — MOXIFLOXACIN 0.5 % EYE DROPS
1.00 [drp] | Freq: Four times a day (QID) | OPHTHALMIC | Status: DC
Start: 2019-04-01 — End: 2019-04-01
  Administered 2019-04-01: 14:00:00

## 2019-04-01 MED ORDER — LIDOCAINE (PF) 100 MG/5 ML (2 %) INTRAVENOUS SYRINGE
INJECTION | Freq: Once | INTRAVENOUS | Status: DC | PRN
Start: 2019-04-01 — End: 2019-04-01
  Administered 2019-04-01: 100 mg via INTRAVENOUS

## 2019-04-01 MED ORDER — TOBRAMYCIN-DEXAMETHASONE 0.3 %-0.1 % EYE OINTMENT
TOPICAL_OINTMENT | Freq: Once | OPHTHALMIC | Status: DC | PRN
Start: 2019-04-01 — End: 2019-04-01

## 2019-04-01 MED ORDER — TROPICAMIDE-CYCLOPENTOLATE-PHENYLEPHRINE-KETOROLAC-PROPARACAINE 1%-1%-2.5%-0.4%-0.1% OPH
1.0000 [drp] | OPHTHALMIC | Status: AC
Start: 2019-04-01 — End: 2019-04-01
  Administered 2019-04-01 (×3): 1 [drp] via OPHTHALMIC

## 2019-04-01 MED ORDER — PREDNISOLONE ACETATE 1 % EYE DROPS,SUSPENSION
1.00 [drp] | Freq: Four times a day (QID) | OPHTHALMIC | Status: DC
Start: 2019-04-01 — End: 2019-04-01
  Administered 2019-04-01: 14:00:00

## 2019-04-01 MED ORDER — SODIUM CHLORIDE 0.9 % (FLUSH) INJECTION SYRINGE
2.0000 mL | INJECTION | Freq: Three times a day (TID) | INTRAMUSCULAR | Status: DC
Start: 2019-04-01 — End: 2019-04-01
  Administered 2019-04-01: 14:00:00

## 2019-04-01 MED ORDER — MOXIFLOXACIN 0.5 % EYE DROPS
1.00 [drp] | Freq: Four times a day (QID) | OPHTHALMIC | 0 refills | Status: DC
Start: 2019-04-01 — End: 2019-04-08

## 2019-04-01 MED ORDER — SUCCINYLCHOLINE(PF)200 MG/10 ML(20 MG/ML)-NACL,ISO INTRAVENOUS SYRINGE
INJECTION | Freq: Once | INTRAVENOUS | Status: DC | PRN
Start: 2019-04-01 — End: 2019-04-01
  Administered 2019-04-01 (×2): 200 mg via INTRAVENOUS

## 2019-04-01 MED ORDER — PROCHLORPERAZINE EDISYLATE 10 MG/2 ML (5 MG/ML) INJECTION SOLUTION
5.0000 mg | Freq: Once | INTRAMUSCULAR | Status: DC | PRN
Start: 2019-04-01 — End: 2019-04-01
  Filled 2019-04-01: qty 2

## 2019-04-01 MED ORDER — FENTANYL (PF) 50 MCG/ML INJECTION SOLUTION
12.5000 ug | INTRAMUSCULAR | Status: DC | PRN
Start: 2019-04-01 — End: 2019-04-01

## 2019-04-01 MED ORDER — SODIUM CHLORIDE 0.9 % (FLUSH) INJECTION SYRINGE
2.0000 mL | INJECTION | INTRAMUSCULAR | Status: DC | PRN
Start: 2019-04-01 — End: 2019-04-01

## 2019-04-01 MED ORDER — DEXAMETHASONE SODIUM PHOSPHATE 4 MG/ML INJECTION SOLUTION
Freq: Once | INTRAMUSCULAR | Status: DC | PRN
Start: 2019-04-01 — End: 2019-04-01
  Administered 2019-04-01: 13:00:00 8 mg via INTRAVENOUS

## 2019-04-01 MED ORDER — LIDOCAINE (PF) 3.5 % EYE GEL
2.0000 [drp] | Freq: Once | OPHTHALMIC | Status: DC | PRN
Start: 2019-04-01 — End: 2019-04-01

## 2019-04-01 MED ORDER — PHENYLEPHRINE 0.5 MG/5 ML (100 MCG/ML)IN 0.9 % SOD.CHLORIDE IV SYRINGE
INJECTION | Freq: Once | INTRAVENOUS | Status: DC | PRN
Start: 2019-04-01 — End: 2019-04-01
  Administered 2019-04-01 (×2): 150 ug via INTRAVENOUS

## 2019-04-01 MED ORDER — TETRACAINE HCL (PF) 0.5 % EYE DROPS
1.0000 [drp] | Freq: Once | OPHTHALMIC | Status: DC | PRN
Start: 2019-04-01 — End: 2019-04-01

## 2019-04-01 MED ORDER — ONDANSETRON HCL (PF) 4 MG/2 ML INJECTION SOLUTION
Freq: Once | INTRAMUSCULAR | Status: DC | PRN
Start: 2019-04-01 — End: 2019-04-01
  Administered 2019-04-01: 13:00:00 4 mg via INTRAVENOUS

## 2019-04-01 MED ORDER — FENTANYL (PF) 50 MCG/ML INJECTION SOLUTION
25.0000 ug | INTRAMUSCULAR | Status: DC | PRN
Start: 2019-04-01 — End: 2019-04-01

## 2019-04-01 MED ORDER — MIDAZOLAM 1 MG/ML INJECTION SOLUTION
Freq: Once | INTRAMUSCULAR | Status: DC | PRN
Start: 2019-04-01 — End: 2019-04-01
  Administered 2019-04-01: 12:00:00 1 mg via INTRAVENOUS

## 2019-04-01 MED ORDER — PREDNISOLONE ACETATE 1 % EYE DROPS,SUSPENSION
1.00 [drp] | Freq: Four times a day (QID) | OPHTHALMIC | 0 refills | Status: DC
Start: 2019-04-01 — End: 2019-07-02

## 2019-04-01 MED ORDER — PROPOFOL 10 MG/ML IV BOLUS
INJECTION | Freq: Once | INTRAVENOUS | Status: DC | PRN
Start: 2019-04-01 — End: 2019-04-01
  Administered 2019-04-01 (×2): 200 mg via INTRAVENOUS

## 2019-04-01 MED ORDER — EPINEPHRINE 1 MG/ML (1 ML) INJECTION SOLUTION
Freq: Once | INTRAOCULAR | Status: DC | PRN
Start: 2019-04-01 — End: 2019-04-01
  Administered 2019-04-01: 13:00:00 via INTRAOCULAR

## 2019-04-01 MED ORDER — PREDNISOLONE ACETATE 1 % EYE DROPS,SUSPENSION
1.00 [drp] | OPHTHALMIC | Status: AC
Start: 2019-04-01 — End: 2019-04-01
  Administered 2019-04-01 (×2): 1 [drp] via OPHTHALMIC
  Filled 2019-04-01: qty 5

## 2019-04-01 MED ORDER — LACTATED RINGERS INTRAVENOUS SOLUTION
INTRAVENOUS | Status: DC
Start: 2019-04-01 — End: 2019-04-01
  Administered 2019-04-01: 14:00:00 via INTRAVENOUS

## 2019-04-01 MED ORDER — BALANCED SALT SOLUTION COMBINATION NO.2 INTRAOCULAR IRRIGATION
15.0000 mL | Freq: Once | INTRAOCULAR | Status: DC | PRN
Start: 2019-04-01 — End: 2019-04-01

## 2019-04-01 SURGICAL SUPPLY — 21 items
CANNULA ANT CHMBR 27G X 7/8IN_585157 10EA/BX (CANNULA) ×2
CANNULA OPTH 25GA 7MM FLAT TIP ANG HDSCT STRL DISP (CANNULA) ×1 IMPLANT
CANNULA OPTH 25GA 7MM FLT TIP_ANG HDSCT STRL DISP (CANNULA) ×1
CANNULA OPTH GRY 7/8IN 27GA VSTC 45D RND CORT HDSCT CLEAVE 11MM STRL LF (CANNULA) ×2 IMPLANT
DISCONTINUED USE  318851 - LENS IOL 0 D +21 DIOP MOD L BICONVEX ACRYSOF ULTRASERT STABLEFORCE 1 PC FLD ASPH UV BLU LIGHT FILTER ×1 IMPLANT
GARTER EYE ASST_28-6536 (OPHTHALMIC SUPPLIES (NOT LENS)) ×1 IMPLANT
GARTER EYE ASST_28-6536 (OPTHALMIC SUPPLIES (NOT LENS)) ×1
GOWN SURG XL AAMI L3 NONREINFO_RCE HKLP CLSR STRL LTX PNK SMS (DGOW)
GOWN SURG XL L3 NONREINFORCE HKLP CLSR STRL LTX PNK SMS 47IN (DGOW) IMPLANT
KNIFE OPTH SLT BLU OPM 22.5D STR STAB INCS STRL LF (SURGICAL CUTTING SUPPLIES) ×1 IMPLANT
KNIFE OPTH SLT BLU OPM 22.5D S_TR STAB INCS STRL LF (CUTTING ELEMENTS) ×1
PACK CATARACT BX/6 (TRAY) ×2 IMPLANT
PACK SOLUTION COMBO_MPI006102 6EA/CS (SOLUTIONS) ×2 IMPLANT
PAD EYE 2.62X1.62IN SURECARE COTTON ABS LF  STRL (OPHTHALMIC SUPPLIES (NOT LENS)) ×2 IMPLANT
PAD EYE SURE CARE GAUZE 82911_25EA/TY 24TY/CS (OPTHALMIC SUPPLIES (NOT LENS)) ×2
SHIELD EYE 76.2X60.3MM ASST AL EY GRTR FOX CVR (OPHTHALMIC SUPPLIES (NOT LENS)) ×1 IMPLANT
SHIELD EYE 76.2X60.3MM ASST AL_EY GRTR FOX CVR (OPTHALMIC SUPPLIES (NOT LENS)) ×1
SYRINGE BD .5IN 27GA 1ML LF  STRL REG BVL DTCH NEEDLE ST TB REG WL DISP GRY (NEEDLES & SYRINGE SUPPLIES) ×1 IMPLANT
SYRINGE BD PRCSNGL .5IN 27GA 1_ML LF STRL DTCH NEEDLE ST TB (NEEDLES & SYRINGE SUPPLIES) ×1
TIP SUCT/IRRG .03MM INTREPID 35D BNT PLMR DISP (OPHTHALMIC SUPPLIES (NOT LENS)) IMPLANT
TIP SUCT/IRRG 35DEG POLYMER_8065751511 6EA/BX (OPTHALMIC SUPPLIES (NOT LENS))

## 2019-04-01 NOTE — Anesthesia Preprocedure Evaluation (Addendum)
ANESTHESIA PRE-OP EVALUATION  Planned Procedure: PHACO WITH INTRAOCULAR LENS (Left )  Review of Systems     anesthesia history negative               Pulmonary   sleep apnea and CPAP,   Cardiovascular    Hypertension ,       GI/Hepatic/Renal    GERD and well controlled     Endo/Other    morbid obesity,   type 2 diabetes/ poorly controlled     Neuro/Psych/MS        Cancer                     Physical Assessment      Airway       Mallampati: III    TM distance: >3 FB    Neck ROM: full  Mouth Opening: fair.            Dental       Dentition intact             Pulmonary    Breath sounds clear to auscultation       Cardiovascular    Rhythm: regular  Rate: Normal       Other findings            Plan  ASA 3     Planned anesthesia type: general              Intravenous induction     Anesthesia issues/risks discussed are: Dental Injuries, Stroke, Intraoperative Awareness/ Recall, Aspiration, PONV, Sore Throat and Cardiac Events/MI.  Anesthetic plan and risks discussed with patient.          Patient's NPO status is appropriate for Anesthesia.           Plan discussed with CRNA.               MPS 10/2018  .  1. Resting left ventricular ejection fraction 62%  .  2. No scintigraphic evidence of myocardial ischemia or infarction.    1. Stress and rest images reveal normal homogeneous uptake of radioisotope in all segments all views  2. Gated portion of the scan reveals resting left ventricular ejection fraction 62%.  There is normal systolic thickening and segmental wall motion      TTE 04/2018  Conclusions:  1. Technically difficult study. Contrast ECHO was performed.  2. Preserved left ventricular systolic function with EF around 60%.  3. Limited visualization of valves and Doppler but no significant valvular abnormalities found.      CAD 12/2017   Less than 50% Left Common Carotid stenosis noted. No significant ICA stenosis seen

## 2019-04-01 NOTE — Brief Op Note (Signed)
[place] OF OPHTHALMOLOGY - BRIEF OP-NOTE    PATIENT NAME:  [place] NAME:  [id number]  DATE OF SERVICE:  04/01/2019  DATE OF BIRTH:  05-08-1961    PREOPERATIVE DIAGNOSIS:  Cataract, left eye  POSTOPERATIVE DIAGNOSIS:  Cataract, left eye    NAME OF PROCEDURE:  Phacoemulsification of cataract left eye with IOL placement  SURGEON:  Candida Peeling, MD  ASSISTANT:  Laurey Morale, MD    ANESTHESIA:  General    ESTIMATED BLOOD LOSS:  <1 cc    COMPLICATIONS:  none    DISPOSITION:  Home on self care      Laurey Morale, MD 04/01/2019, 13:35        04/01/2019  Discharge Note    Patient meets anesthesia discharge criteria  D/C to home  Resume any current outpatient medications  Use eye medications as instructed  RTC as scheduled    Laurey Morale, MD  04/01/2019, 13:36      I was present for all key and/or critical portions of the case and immediately available at all times.      Candida Peeling, MD 04/01/2019, 13:37

## 2019-04-01 NOTE — H&P (Signed)
[place] OF OPHTHALMOLOGY - H&P UPDATE FORM    PATIENT NAME:  [name] NAME:  [id number]  DATE OF SERVICE:  04/01/2019  DATE OF BIRTH:  11-27-1960    Outpatient Pre-surgical H&P updated the day of procedure.    1.  H&P (completed within 30 days of surgical procedure by Nehemiah Massed, PA-C on 03/25/2019) reviewed.    [X]  H&P assessment remains unchanged based on completion of re-assessment    [ ]  H&P assessment updated as follows:    [ ]  Respiratory    [ ]  Cardiovascular    [ ]  GI    [ ]  Neuro    [ ]  Change in meds: no    Comments: denies chest pain, SOB, nausea, fevers, chills      2.  Patient continues to be appropriate candidate for planned surgical procedure.  yes      Laurey Morale, MD 04/01/2019, 11:58      I saw and examined the patient.  I reviewed the resident's note.  I agree with the findings and plan of care as documented in the resident's note.  Any exceptions/additions are edited/noted.    Candida Peeling, MD

## 2019-04-01 NOTE — Nurses Notes (Signed)
Dr. Exie Parody notified of pts BS of 253, no orders at this time.  Will continue to assess and monitor.

## 2019-04-01 NOTE — Anesthesia Transfer of Care (Signed)
ANESTHESIA TRANSFER OF CARE   [name] is a 58 y.o. ,male, Weight: (!) 146 kg (321 lb 3.4 oz)   had Procedure(s):  PHACO WITH INTRAOCULAR LENS  performed  04/01/19   Primary Service: Candida Peeling, MD    Past Medical History:   Diagnosis Date   . Ankle injury    . Cellulitis 2013   . CPAP (continuous positive airway pressure) dependence    . Diabetes    . Dizzy spells     bradycardia   . Esophageal reflux     takes tums sometimes   . HTN    . Hyperlipidemia    . Knee injury    . Morbid obesity (CMS [place])    . Nerve damage    . Sleep apnea     c pap; setting 3   . Type 2 diabetes mellitus (CMS [facility])     dx'd 5-6 yrs ago   . Wears glasses       Allergy History as of 04/01/19     OXYCODONE-ACETAMINOPHEN       Noted Status Severity Type Reaction    12/04/11 1256 Roque Cash, MA 07/20/10 Deleted       07/20/10 1527 Leeanne Deed, RN 07/20/10 Active             OXYCODONE-ACETAMINOPHEN       Noted Status Severity Type Reaction    03/26/19 1542 Ethelene Hal, RN 09/03/11 Active Low  Hives/ Urticaria    09/03/11 2234 Farley Ly, RN 09/03/11 Active                 I completed my transfer of care / handoff to the receiving personnel during which we discussed:  Access, Airway, All key/critical aspects of case discussed, Analgesia, Antibiotics, Expectation of post procedure, Fluids/Product, Gave opportunity for questions and acknowledgement of understanding, Labs and PMHx                                                                    Last OR Temp: Temperature: 36.2 C (97.2 F)  ABG:  PCO2   Date Value Ref Range Status   07/20/2010 43.0 41.0 - 51.0 mm Hg Final     PO2   Date Value Ref Range Status   07/20/2010 41 35 - 50 mm Hg Final     POTASSIUM   Date Value Ref Range Status   10/02/2018 3.6 3.6 - 5.1 mmol/L Final   09/03/2011 3.4 (L) 3.5 - 5.1 mmol/L Final     CALCIUM   Date Value Ref Range Status   10/02/2018 8.8 (L) 8.9 - 10.3 mg/dL Final   09/03/2011 8.5 8.5 - 10.4 mg/dL Final     GLUCOSE, POINT  OF CARE   Date Value Ref Range Status   09/06/2011 168 (H) 70 - 105 mg/dL Final     Comment:     RN Notified     HEMOGLOBIN   Date Value Ref Range Status   07/20/2010 15.4 13.5 - 18.0 g/dL Final     O2HB   Date Value Ref Range Status   07/20/2010 67.7 40.0 - 70.0 % Final     CARBOHYHEMOGLOBIN   Date Value Ref Range Status   07/20/2010  1.6 (H) 0.0 - 1.5 % Final     MET-HEMOGLOBIN   Date Value Ref Range Status   07/20/2010 0.0 0.0 - 3.0 % Final     BASE EXCESS   Date Value Ref Range Status   07/20/2010 Test Not Performed 0.0 - 2.0 mmol/L Final     BASE DEFICIT   Date Value Ref Range Status   07/20/2010 0.6 0.0 - 2.0 mmol/L Final     BICARBONATE   Date Value Ref Range Status   07/20/2010 23.5 22.0 - 26.0 mmol/L Final     %FIO2   Date Value Ref Range Status   07/20/2010 97 % Final     Airway:* No LDAs found *  Blood pressure (!) 160/89, pulse 83, temperature 36.2 C (97.2 F), resp. rate 15, height 1.727 m ('5\' 8"'$ ), weight (!) 146 kg (321 lb 3.4 oz), SpO2 98 %.

## 2019-04-01 NOTE — Nurses Notes (Signed)
Pt is being discharge to home, AVS explained to Pt and spouse/family, They verbed understanding, Pt will be discharge via W/C.

## 2019-04-01 NOTE — OR Surgeon (Signed)
[name]  [facility]OSP[facility] NUMBER: [id number]    DATE OF SERVICE: 04/01/2019  DATE OF BIRTH: 01/17/1961    PREOPERATIVE DIAGNOSIS: Nuclear sclerotic cataract, posterior subcapsular cataract, left EYE  POSTOPERATIVE DIAGNOSIS: nuclear sclerotic cataract, posterior subcapsular cataract, left EYE    NAME OF PROCEDURE: Complex phacoemulsification of cataract with posterior chamber lens implant, requiring trypan blue, left EYE (CPT code: 4540966982)      SURGEONS: Dorothyann PengLingo Noriel Guthrie, MD (staff), Octaviano GlowEvan Field, MD (assistant).     ANESTHESIA: General anesthesia  ESTIMATED BLOOD LOSS: Less than 1 drop.   COMPLICATIONS: There were no complications.   SPECIMENS: None.     DESCRIPTION OF PROCEDURE: In the preanesthesia area, the patient was identified. The patient was taken back to the operating room suite.  A surgical Time-Out confirmed the correct patient, procedure, and operative eye.     The patient was placed under general anesthesia and intubated by the Anesthesiology team.    Akten 4% jelly was placed on the operative eye.    The eye was then prepped and draped in the usual sterile fashion. A speculum was placed in the eye.     Wescott scissors were used to make a small inferior-temporal incision in the conjunctiva and Tenons tissue.  Approximately 1ml of a 1:1 mixture of 2% xylocaine and 0.75% marcaine with the addition of 100U of Vitrase was injected in a subTenons fashion to the operative eye.    A disposable supersharp blade was used to make a 1-mm side port entry through clear cornea along the inferior-temporal limbus.     Trypan blue was injected into the anterior chamber to stain the anterior lens capsule, as the red reflex was noted to be poor due to the density of the cataract.    The anterior chamber was filled with viscoelastic material. The keratome was used to make a 2.4-mm phaco port incision through clear cornea temporally along the limbus. The cystotome and Utrata forceps were used to create a continuous  curvilinear capsulorrhexis. Hydrodissection was performed with 1% preservative-free Xylocaine on a cannula. The tip of the phacoemulsification handpiece was introduced into the eye, and the lens nucleus was emulsified using a modified bimanual nuclear splitting technique.     The phaco tip was removed and the tip of the I/A handpiece introduced into the eye. The remaining lens cortical material was aspirated from the eye. The total CDE was 30.54.  The I/A tip was removed, and the capsular bag was inflated with viscoelastic material. The posterior chamber lens implant, Alcon [id number], of +21.0 diopters power, serial number (see below) was brought onto the field and inspected. It was without defect and was loaded into the lens injector. The tip of the injector was inserted into the eye, and the lens implant was placed in the capsular bag. The injector tip was removed and the tip of the I/A handpiece reintroduced into the eye. The remaining viscoelastic material was aspirated. The I/A tip was removed.      The anterior chamber was pressurized with balanced salt solution. The anterior wound margins were hydrated with balanced salt solution. Weck-cel sponges were gently brushed against the main cornea incision and confirmed that there was no wound leakage.     The speculum was removed from the eye, and some tobradex eye ointment was placed on the operative eye.  The eye was bandaged with a soft eye patch and a rigid shield.     The patient was awakened from general anesthesia and  extubated by the Anesthesiology team.    The patient was returned to same day surgery in excellent condition. Dr. Billie Ruddy performed the entire procedure.      Implant Name Type Inv. Item Serial No. Manufacturer Lot No. LRB No. Used   LENS IOL 0 D +21 DIOP MOD L PL ANAR ACRYSOF STABLEFORCE - [id number]  LENS IOL 0 D +21 DIOP MOD L PL ANAR ACRYSOF STABLEFORCE [id number] [employer] NA Left 1

## 2019-04-01 NOTE — Discharge Instructions (Signed)
SURGICAL DISCHARGE INSTRUCTIONS     Dr. Lai, Lingo, MD  performed your PHACO WITH INTRAOCULAR LENS today at the [facility]    [facility]:  Monday through Friday from 6 a.m. - 7 p.m.: [phone]  Between 7 p.m. - 6 a.m., weekends and holidays:  Call [facility] at [phone] or [phone].    PLEASE SEE WRITTEN HANDOUTS AS DISCUSSED BY YOUR NURSE:      SIGNS AND SYMPTOMS OF A WOUND / INCISION INFECTION   Be sure to watch for the following:   Increase in redness or red streaks near or around the wound or incision.   Increase in pain that is intense or severe and cannot be relieved by the pain medication that your doctor has given you.   Increase in swelling that cannot be relieved by elevation of a body part, or by applying ice, if permitted.   Increase in drainage, or if yellow / green in color and smells bad. This could be on a dressing or a cast.   Increase in fever for longer than 24 hours, or an increase that is higher than 101 degrees Fahrenheit (normal body temperature is 98 degrees Fahrenheit). The incision may feel warm to the touch.    **CALL YOUR DOCTOR IF ONE OR MORE OF THESE SIGNS / SYMPTOMS SHOULD OCCUR.    ANESTHESIA INFORMATION   ANESTHESIA -- ADULT PATIENTS:  You have received intravenous sedation / general anesthesia, and you may feel drowsy and light-headed for several hours. You may even experience some forgetfulness of the procedure. DO NOT DRIVE A MOTOR VEHICLE or perform any activity requiring complete alertness or coordination until you feel fully awake in about 24-48 hours. Do not drink alcoholic beverages for at least 24 hours. Do not stay alone, you must have a responsible adult available to be with you. You may also experience a dry mouth or nausea for 24 hours. This is a normal side effect and will disappear as the effects of the medication wear off.    REMEMBER   If you experience any difficulty breathing, chest pain, bleeding that you feel is  excessive, persistent nausea or vomiting or for any other concerns:  Call your physician Dr. Lai at [phone] or [phone]. You may also ask to have the Eye doctor on call paged. They are available to you 24 hours a day.    SPECIAL INSTRUCTIONS / COMMENTS       FOLLOW-UP APPOINTMENTS   Please call patient services at [phone] or [phone] to schedule a date / time of return. They are open Monday - Friday from 7:30 am - 5:00 pm.

## 2019-04-02 ENCOUNTER — Ambulatory Visit: Payer: 59 | Attending: Ophthalmology | Admitting: Ophthalmology

## 2019-04-02 ENCOUNTER — Encounter (INDEPENDENT_AMBULATORY_CARE_PROVIDER_SITE_OTHER): Payer: Self-pay | Admitting: Ophthalmology

## 2019-04-02 DIAGNOSIS — Z961 Presence of intraocular lens: Secondary | ICD-10-CM

## 2019-04-02 DIAGNOSIS — Z9842 Cataract extraction status, left eye: Secondary | ICD-10-CM | POA: Insufficient documentation

## 2019-04-02 DIAGNOSIS — Z48810 Encounter for surgical aftercare following surgery on the sense organs: Secondary | ICD-10-CM | POA: Insufficient documentation

## 2019-04-02 NOTE — Anesthesia Postprocedure Evaluation (Signed)
Anesthesia Post Op Evaluation    Patient: [name]  Procedure(s):  [facility] WITH INTRAOCULAR LENS    Last Vitals:Temperature: 36.2 C (97.2 F) (04/01/19 1400)  Heart Rate: 81 (04/01/19 1501)  BP (Non-Invasive): 126/68 (04/01/19 1501)  Respiratory Rate: 14 (04/01/19 1501)  SpO2: 100 % (04/01/19 1501)    Patient is sufficiently recovered from the effects of anesthesia to participate in the evaluation and has returned to their pre-procedure level.  Patient location during evaluation: PACU       Patient participation: complete - patient participated  Level of consciousness: awake and alert    Pain management: satisfactory to patient  Airway patency: patent    Anesthetic complications: no  Cardiovascular status: blood pressure returned to baseline  Respiratory status: spontaneous ventilation  Hydration status: acceptable  Patient post-procedure temperature: Pt Normothermic   PONV Status: Absent

## 2019-04-02 NOTE — Progress Notes (Addendum)
OPHTHALMOLOGY CLINIC, [facility]  [address]  [place] [id number]  [facility] by [facility]         Patient Name: Dean Eelan D Vavrek  MRN#: [id number]  Birthdate: 07/21/1961    Date of Service: 04/02/2019    Chief Complaint     Post-op Eye          [name] is a 58 y.o. male who presents today for evaluation/consultation of:  HPI     Post-op Eye     In left eye.              Comments     Pt is here today s/p Phacoemulsification of cataract left eye with IOL placement (04/01/2019).  Pt notes that he is doing well.   He stated that he was given his eye drops, but notes that he has not yet used them b/c he stayed in a hotel last night. He stated that he left his eye drops at home.           Last edited by Francine GravenUrban, Ericka, COA on 04/02/2019  8:54 AM. (History)        ROS     Positive for: Eyes (pov)    Negative for: Constitutional, Gastrointestinal, Neurological, Skin, Genitourinary, Musculoskeletal, HENT, Endocrine, Cardiovascular, Respiratory, Psychiatric, Allergic/Imm, Heme/Lymph    Last edited by Francine GravenUrban, Ericka, COA on 04/02/2019  8:54 AM. (History)         All other systems Negative    Francine GravenEricka Urban, COA 04/02/2019, 08:54       Past Surgical History:   Procedure Laterality Date   . COLONOSCOPY  11/22/2017    Dr Georgian Coiddick   . HX CATARACT REMOVAL Right    . HX CATARACT REMOVAL Left 04/01/2019    Phacoemulsification of cataract left eye with IOL placement (04/01/2019).   Marland Kitchen. HX HERNIA REPAIR      Bilateral   . HX HERNIA REPAIR     . HX HERNIA REPAIR      inguinal and umbilical repair    . HX ORCHIOPEXY     . HX OTHER      at age 58 to veins twisted in groin   . HX TONSILLECTOMY     . HX WISDOM TEETH EXTRACTION     . KNEE ARTHROSCOPY Bilateral            Past Medical History:   Diagnosis Date   . Ankle injury    . Cellulitis 2013   . CPAP (continuous positive airway pressure) dependence    . Diabetes    . Dizzy spells     bradycardia   . Esophageal reflux     takes tums sometimes   . HTN    .  Hyperlipidemia    . Knee injury    . Morbid obesity (CMS HCC)    . Nerve damage    . Sleep apnea     c pap; setting 3   . Type 2 diabetes mellitus (CMS HCC)     dx'd 5-6 yrs ago   . Wears glasses            Patient Active Problem List   Diagnosis   . Chest pain   . HTN (hypertension)   . Diabetes mellitus (CMS HCC)   . Hyperlipidemia   . Morbid obesity (CMS HCC)   . Sleep apnea   . Cellulitis, scrotum   . Sleep apnea       Family  History:  Family Medical History:     Problem Relation (Age of Onset)    Cancer Mother    Congestive Heart Failure Maternal Grandmother    Diabetes Paternal Uncle              Social History:     Social History     Tobacco Use   . Smoking status: Never Smoker   . Smokeless tobacco: Never Used   Substance Use Topics   . Alcohol use: No       MD Addition to HPI: Pt reports no eye pain.  Has not started eyedrops yet because he left them at home and he stayed at a hotel locally last night.      Current Eyedrops: None           Assessment:    1. Status post cataract extraction and insertion of intraocular lens of left eye    2. Pseudophakia of right eye        Ophthalmic Plan of Care:    1) POD#1 s/p phaco/PCIOL OS (04/01/2019)  - Doing well.  - VASC OS 20/80  - Start Acular gtt QID OS  - Start Prednisolone 1% gtt QID OS  - Start Vigamox gtt QID OS  - Wear eye shield over operative eye when sleeping x 1 week.  - Activity restrictions reviewed: no eye rubbing, no heavy lifting greater than 15 lbs, no bending over activities, no eye makeup, no water directly in eye, no swimming for 2 weeks.      Follow up:    I have asked DAVIDMICHAEL ZARAZUA to follow up in 1 week in [place]).         Candida Peeling, MD 04/02/2019, 09:36  Assistant Professor  [facility], Department of Ophthalmology    I have seen and examined the above patient. I discussed the above diagnoses listed in the assessment and the above ophthalmic plan of care with the patient and patient's family. All questions were answered.  I reviewed and, when necessary, made changes to the technician/resident note, documented ophthalmology exam, chief complaint, history of present illness, allergies, review of systems, past medical, past surgical, family and social history.    No orders of the defined types were placed in this encounter.      No orders of the defined types were placed in this encounter.

## 2019-04-06 ENCOUNTER — Other Ambulatory Visit (INDEPENDENT_AMBULATORY_CARE_PROVIDER_SITE_OTHER): Payer: Self-pay | Admitting: Family

## 2019-04-06 DIAGNOSIS — F39 Unspecified mood [affective] disorder: Secondary | ICD-10-CM

## 2019-04-07 NOTE — Telephone Encounter (Signed)
If patient is still getting care here, he needs to schedule follow up appt.

## 2019-04-08 ENCOUNTER — Ambulatory Visit: Payer: 59 | Attending: Ophthalmology | Admitting: Ophthalmology

## 2019-04-08 ENCOUNTER — Other Ambulatory Visit: Payer: Self-pay

## 2019-04-08 ENCOUNTER — Encounter (INDEPENDENT_AMBULATORY_CARE_PROVIDER_SITE_OTHER): Payer: Self-pay | Admitting: Ophthalmology

## 2019-04-08 DIAGNOSIS — Z9889 Other specified postprocedural states: Secondary | ICD-10-CM | POA: Insufficient documentation

## 2019-04-08 DIAGNOSIS — Z9842 Cataract extraction status, left eye: Secondary | ICD-10-CM | POA: Insufficient documentation

## 2019-04-08 DIAGNOSIS — Z961 Presence of intraocular lens: Secondary | ICD-10-CM | POA: Insufficient documentation

## 2019-04-08 NOTE — Progress Notes (Addendum)
[facility]   [address]  [place] [id number]           Patient Name: [name]  MRN#: [id number]  Birthdate: 03/16/1961    Date of Service: 04/08/2019    Chief Complaint     Post-op Eye          [name] is a 58 y.o. male who presents today for evaluation/consultation of:  HPI     Post-op Eye     In left eye.              Comments     1 week post op OS. Vision OS doing well. Using Ofloxacin, ketorolac and prednisolone QID OS. No changes in vision OD          Last edited by Rennie Natteratlin, Angela, COA on 04/08/2019 12:56 PM. (History)        ROS     Positive for: Eyes ( post op OS)    Negative for: Constitutional, Gastrointestinal, Neurological, Skin, Genitourinary, Musculoskeletal, HENT, Endocrine, Cardiovascular, Respiratory, Psychiatric, Allergic/Imm, Heme/Lymph    Last edited by Rennie Natteratlin, Angela, COA on 04/08/2019 12:56 PM. (History)         All other systems Negative    Rennie Natterngela Catlin, COA 04/08/2019, 13:04       Past Surgical History:   Procedure Laterality Date   . COLONOSCOPY  11/22/2017    Dr Georgian Coiddick   . HX CATARACT REMOVAL Right    . HX CATARACT REMOVAL Left 04/01/2019    Phacoemulsification of cataract left eye with IOL placement (04/01/2019).   Marland Kitchen. HX HERNIA REPAIR      Bilateral   . HX HERNIA REPAIR     . HX HERNIA REPAIR      inguinal and umbilical repair    . HX ORCHIOPEXY     . HX OTHER      at age 89 to veins twisted in groin   . HX TONSILLECTOMY     . HX WISDOM TEETH EXTRACTION     . KNEE ARTHROSCOPY Bilateral            Past Medical History:   Diagnosis Date   . Ankle injury    . Cellulitis 2013   . CPAP (continuous positive airway pressure) dependence    . Diabetes    . Dizzy spells     bradycardia   . Esophageal reflux     takes tums sometimes   . HTN    . Hyperlipidemia    . Knee injury    . Morbid obesity (CMS HCC)    . Nerve damage    . Sleep apnea     c pap; setting 3   . Type 2 diabetes mellitus (CMS HCC)     dx'd 5-6 yrs ago   . Wears glasses            Patient Active Problem  List   Diagnosis   . Chest pain   . HTN (hypertension)   . Diabetes mellitus (CMS HCC)   . Hyperlipidemia   . Morbid obesity (CMS HCC)   . Sleep apnea   . Cellulitis, scrotum   . Sleep apnea       Family History:  Family Medical History:     Problem Relation (Age of Onset)    Cancer Mother    Congestive Heart Failure Maternal Grandmother    Diabetes Paternal Uncle  Social History:     Social History     Tobacco Use   . Smoking status: Never Smoker   . Smokeless tobacco: Never Used   Substance Use Topics   . Alcohol use: No       MD Addition to HPI: Pt reports no eye pain.  States vision is good.    Current Eyedrops:   PF QID OS  Acular QID OS  Ocuflox QID OS           Assessment:    1. Status post cataract extraction and insertion of intraocular lens of left eye    2. Pseudophakia of right eye        Ophthalmic Plan of Care:    1) POW#1 s/p phaco/PCIOL OS (04/01/2019)  - Doing well.  - VASC OS 20/25  - Taper Prednisolone 1% gtt 3-2-1-0 OS weekly  - Decrease to Acular BID OS for 3 more weeks and then stop  - D/C Vigamox/Ocuflox  - D/C eye shield  - Activity restrictions reviewed: no eye rubbing, no heavy lifting greater than 15 lbs, no bending over activities, no eye makeup, no water directly in eye, no swimming for 1 more week.    2) pseudophakia OD  - IOL in good position  - VASC OD 20/20      Follow up:    I have asked LEVEN HOEL to follow up in 4-6 weeks with Dr. Jacques Earthly for MRx OU.         Candida Peeling, MD 04/08/2019, 13:28  Assistant Professor  [facility], Department of Ophthalmology    I have seen and examined the above patient. I discussed the above diagnoses listed in the assessment and the above ophthalmic plan of care with the patient and patient's family. All questions were answered. I reviewed and, when necessary, made changes to the technician/resident note, documented ophthalmology exam, chief complaint, history of present illness, allergies, review of systems, past  medical, past surgical, family and social history.    No orders of the defined types were placed in this encounter.      No orders of the defined types were placed in this encounter.

## 2019-04-14 ENCOUNTER — Other Ambulatory Visit (INDEPENDENT_AMBULATORY_CARE_PROVIDER_SITE_OTHER): Payer: Self-pay | Admitting: Family

## 2019-04-14 DIAGNOSIS — F39 Unspecified mood [affective] disorder: Secondary | ICD-10-CM

## 2019-05-06 ENCOUNTER — Other Ambulatory Visit (INDEPENDENT_AMBULATORY_CARE_PROVIDER_SITE_OTHER): Payer: Self-pay | Admitting: Family

## 2019-05-07 ENCOUNTER — Other Ambulatory Visit (INDEPENDENT_AMBULATORY_CARE_PROVIDER_SITE_OTHER): Payer: Self-pay | Admitting: Family

## 2019-05-14 ENCOUNTER — Other Ambulatory Visit (INDEPENDENT_AMBULATORY_CARE_PROVIDER_SITE_OTHER): Payer: Self-pay | Admitting: Family

## 2019-06-06 ENCOUNTER — Other Ambulatory Visit (INDEPENDENT_AMBULATORY_CARE_PROVIDER_SITE_OTHER): Payer: Self-pay | Admitting: Family

## 2019-06-13 ENCOUNTER — Other Ambulatory Visit (INDEPENDENT_AMBULATORY_CARE_PROVIDER_SITE_OTHER): Payer: Self-pay | Admitting: Family

## 2019-07-01 ENCOUNTER — Ambulatory Visit (INDEPENDENT_AMBULATORY_CARE_PROVIDER_SITE_OTHER): Payer: Self-pay | Admitting: Vascular & Interventional Radiology

## 2019-07-02 ENCOUNTER — Encounter (INDEPENDENT_AMBULATORY_CARE_PROVIDER_SITE_OTHER): Payer: Self-pay | Admitting: Cardiovascular Disease

## 2019-07-02 ENCOUNTER — Other Ambulatory Visit: Payer: Self-pay

## 2019-07-02 ENCOUNTER — Ambulatory Visit: Payer: 59 | Attending: Cardiovascular Disease | Admitting: Cardiovascular Disease

## 2019-07-02 VITALS — BP 144/82 | HR 104 | Temp 97.5°F | Resp 20 | Ht 68.0 in | Wt 323.2 lb

## 2019-07-02 DIAGNOSIS — R42 Dizziness and giddiness: Secondary | ICD-10-CM | POA: Insufficient documentation

## 2019-07-02 DIAGNOSIS — I472 Ventricular tachycardia: Secondary | ICD-10-CM | POA: Insufficient documentation

## 2019-07-02 DIAGNOSIS — I10 Essential (primary) hypertension: Secondary | ICD-10-CM | POA: Insufficient documentation

## 2019-07-02 DIAGNOSIS — R000 Tachycardia, unspecified: Secondary | ICD-10-CM

## 2019-07-02 NOTE — Progress Notes (Signed)
CARDIOLOGY, [place]  [place]  [place] [address]  Phone: [phone]  Fax: [phone]    Encounter Date: 07/02/2019    Patient ID:  [name]  EHM:[id number]    DOB: [birth year]  Age: 58 y.o. male    Subjective:     Chief Complaint   Patient presents with   . Peripheral Vascular Disease     HPI:  Patient referred back by his PCP because of continued episodes of dizziness and tachycardia.  We had seen him in the past because he had been having episodes of dizziness and near syncope.  Symptoms had been ongoing for several months prior to [place] seeing him.  He wore a monitor that showed some short runs of nonsustained V-tach so he wound up getting an echo and a stress test done, both of which were relatively unremarkable with no ischemia and good left ventricular systolic function.  He has also had unremarkable carotid ultrasound in the past.  He has not had any syncope but he says he has continued to have recurrent episodes sudden onset of dizziness, palpitations with elevated heart rates, and he develops some occasional episodes of tunnel vision.  He has not had any syncope.  He says he has been keeping track of his blood pressure and heart rate for the most part his blood pressures have been normal but his heart rates have been elevated and running from 100-120 when he has these symptomatic episodes.  Current Outpatient Medications   Medication Sig   . albuterol sulfate (PROAIR HFA) 90 mcg/actuation Inhalation HFA Aerosol Inhaler Take 1-2 Puffs by inhalation Every 6 hours as needed (Patient taking differently: Take 1-2 Puffs by inhalation Every 6 hours as needed Using seasonal)   . Aspirin 81 mg Oral Tablet take 81 mg by mouth Once a day.   . cyclobenzaprine (FLEXERIL) 10 mg Oral Tablet Take 5 mg by mouth Once a day    . dapagliflozin (FARXIGA) 10 mg Oral Tablet Take 10 mg by mouth Once a day Takes in am   . FLUoxetine (PROZAC) 20 mg Oral Capsule TAKE ONE CAPSULE (20 MG total) BY  MOUTH ONCE DAILY FOR 90 DAYS   . fluticasone-salmeterol (ADVAIR) 250-50 mcg/dose Inhalation Disk with Device oral diskus inhaler Take 1 INHALATION by inhalation Twice daily for 90 days   . furosemide (LASIX) 40 mg Oral Tablet TAKE ONE TABLET BY MOUTH ONCE DAILY FOR 90 DAYS   . insulin lispro (HUMALOG KWIKPEN INSULIN) 100 unit/mL Subcutaneous Insulin Pen Three times a day with meals per sliding scale, up to 36 units a day4   . Metformin (GLUCOPHAGE) 1,000 mg Oral Tablet Take 1,000 mg by mouth Twice daily    . metoprolol tartrate (LOPRESSOR) 25 mg Oral Tablet Take 1 Tab (25 mg total) by mouth Every 12 hours for 90 days   . multivitamin Oral Tablet Take 1 Tab by mouth Once a day   . potassium chloride (KLOR-CON) 10 mEq Oral Tablet Sustained Release TAKE ONE TABLET BY MOUTH ONCE DAILY WITH FOOD AND lasix   . rosuvastatin (CRESTOR) 20 mg Oral Tablet Take 1 Tab (20 mg total) by mouth Every evening for 90 days   . TRESIBA FLEXTOUCH U-100 100 unit/mL (3 mL) Subcutaneous Insulin Pen INJECT 25 UNITS SUBCUTANEOUSLY ONCE DAILY FOR DIABETES (60 DAY supply)   . valsartan-hydroCHLOROthiazide (DIOVAN HCT) 320-25 mg Oral Tablet Take 1 Tab by mouth Once a day for 90 days     Allergies  Allergen Reactions   . Tylox [Oxycodone-Acetaminophen] Hives/ Urticaria     Past Medical History:   Diagnosis Date   . Ankle injury    . Cellulitis 2013   . CPAP (continuous positive airway pressure) dependence    . Diabetes    . Dizzy spells     bradycardia   . Esophageal reflux     takes tums sometimes   . HTN    . Hyperlipidemia    . Knee injury    . Morbid obesity (CMS HCC)    . Nerve damage    . Sleep apnea     c pap; setting 3   . Type 2 diabetes mellitus (CMS HCC)     dx'd 5-6 yrs ago   . Wears glasses          Past Surgical History:   Procedure Laterality Date   . COLONOSCOPY  11/22/2017    Dr Georgian Coiddick   . HX CATARACT REMOVAL Right    . HX CATARACT REMOVAL Left 04/01/2019    Phacoemulsification of cataract left eye with IOL placement  (04/01/2019).   Marland Kitchen. HX HERNIA REPAIR      Bilateral   . HX HERNIA REPAIR     . HX HERNIA REPAIR      inguinal and umbilical repair    . HX ORCHIOPEXY     . HX OTHER      at age 58 to veins twisted in groin   . HX TONSILLECTOMY     . HX WISDOM TEETH EXTRACTION     . KNEE ARTHROSCOPY Bilateral          Family Medical History:     Problem Relation (Age of Onset)    Cancer Mother    Congestive Heart Failure Maternal Grandmother    Diabetes Paternal Uncle            Social History     Tobacco Use   . Smoking status: Never Smoker   . Smokeless tobacco: Never Used   Substance Use Topics   . Alcohol use: No   . Drug use: No       Review of Systems   Constitutional: Negative for chills and fever.   HENT: Negative for nosebleeds.    Respiratory: Negative for chest tightness and shortness of breath.    Cardiovascular: Positive for palpitations and leg swelling. Negative for chest pain.   Gastrointestinal: Negative for abdominal pain and blood in stool.   Genitourinary: Negative.    Musculoskeletal: Negative for arthralgias and myalgias.   Skin: Negative for rash.   Neurological: Positive for dizziness. Negative for syncope and light-headedness.   Psychiatric/Behavioral: Negative for confusion.     Objective:   Vitals: BP (!) 144/82   Pulse (!) 104   Temp 36.4 C (97.5 F)   Resp 20   Ht 1.727 m (5\' 8" )   Wt (!) 147 kg (323 lb 3.2 oz)   SpO2 99%   BMI 49.14 kg/m         Physical Exam  HENT:      Head: Normocephalic and atraumatic.   Eyes:      General: No scleral icterus.  Neck:      Musculoskeletal: Normal range of motion and neck supple.   Cardiovascular:      Rate and Rhythm: Normal rate and regular rhythm.   Pulmonary:      Breath sounds: Normal breath sounds.   Abdominal:      Palpations: Abdomen is  soft.      Tenderness: There is no abdominal tenderness.   Musculoskeletal:         General: Swelling present.   Skin:     General: Skin is warm and dry.   Neurological:      Mental Status: He is alert and oriented to  person, place, and time.   Psychiatric:         Mood and Affect: Mood normal.         Behavior: Behavior normal.         Assessment & Plan:     ENCOUNTER DIAGNOSES     ICD-10-CM   1. Dizziness  R42   2. HTN (hypertension)  I10   3. Tachycardia  R00.0     Plan:  We did an ECG in the office and by the time we did the ECG is heart rate was in the 80s and his ECG showed sinus rhythm and appeared normal.  We will go ahead and start with a Holter monitor again for now just to make sure we do not have any arrhythmias since his symptoms have been increasing in frequency again.  Told her to keep watch of his symptoms, frequency, and duration and to notify us and if needed we could place a different type of monitor.  Told him to also make sure and get back to see his PCP as at this point there are certainly other noncardiac causes that could be causing his problems as so far his cardiac workup has been unremarkable.  We set him up for return in 2 months for now but could see him back sooner if needed.  Orders Placed This Encounter   . EKG (In Clinic - Same Day)   . HOLTER MONITOR 48 HOURS ([facility] AND [facility] LOCATIONS)       Return in about 2 months (around 09/01/2019).    Mariea Stable, MD

## 2019-07-03 ENCOUNTER — Other Ambulatory Visit (INDEPENDENT_AMBULATORY_CARE_PROVIDER_SITE_OTHER): Payer: Self-pay | Admitting: Family

## 2019-07-03 LAB — ECG W INTERP (CLINIC ONLY)
Atrial Rate: 88 {beats}/min
Calculated P Axis: 40 degrees
Calculated R Axis: 23 degrees
Calculated T Axis: 50 degrees
PR Interval: 166 ms
QRS Duration: 100 ms
QT Interval: 356 ms
QTC Calculation: 430 ms
Ventricular rate: 88 {beats}/min

## 2019-07-04 ENCOUNTER — Ambulatory Visit
Admission: RE | Admit: 2019-07-04 | Discharge: 2019-07-04 | Disposition: A | Discharge status: Home / Self Care | Payer: 59 | Source: Ambulatory Visit | Attending: Cardiovascular Disease | Admitting: Cardiovascular Disease

## 2019-07-04 ENCOUNTER — Other Ambulatory Visit: Payer: Self-pay

## 2019-07-04 DIAGNOSIS — R000 Tachycardia, unspecified: Secondary | ICD-10-CM | POA: Insufficient documentation

## 2019-07-04 DIAGNOSIS — R42 Dizziness and giddiness: Secondary | ICD-10-CM | POA: Insufficient documentation

## 2019-07-12 ENCOUNTER — Other Ambulatory Visit (INDEPENDENT_AMBULATORY_CARE_PROVIDER_SITE_OTHER): Payer: Self-pay | Admitting: Family

## 2019-07-14 ENCOUNTER — Telehealth (HOSPITAL_COMMUNITY): Payer: Self-pay | Admitting: Cardiovascular Disease

## 2019-07-14 NOTE — Telephone Encounter (Signed)
LM for patient to return call to office.  Marthe Patch, LPN  27/10/5007, 38:18

## 2019-07-14 NOTE — Telephone Encounter (Signed)
-----   Message from Silverio Lay, MD sent at 07/11/2019 12:51 PM EST -----  No significant arrhythmias seen.  Patient can be reassured that there are no significant problems provided they had typical symptoms wearing the monitor.  If there were no symptoms wearing the monitor and symptoms continue, call us and we could arrange an event monitor which can monitor him for a longer period of time

## 2019-07-22 ENCOUNTER — Telehealth (INDEPENDENT_AMBULATORY_CARE_PROVIDER_SITE_OTHER): Payer: Self-pay | Admitting: Cardiovascular Disease

## 2019-07-22 DIAGNOSIS — I4729 Other ventricular tachycardia: Secondary | ICD-10-CM

## 2019-07-22 DIAGNOSIS — I472 Ventricular tachycardia: Secondary | ICD-10-CM

## 2019-07-22 DIAGNOSIS — R079 Chest pain, unspecified: Secondary | ICD-10-CM

## 2019-07-22 NOTE — Telephone Encounter (Signed)
-----   Message from Gerardo C Lopez, MD sent at 07/11/2019 12:51 PM EST -----  No significant arrhythmias seen.  Patient can be reassured that there are no significant problems provided they had typical symptoms wearing the monitor.  If there were no symptoms wearing the monitor and symptoms continue, call us and we could arrange an event monitor which can monitor him for a longer period of time

## 2019-07-22 NOTE — Telephone Encounter (Signed)
Patient states that he did not have any symptoms during the 48 hour monitor and would like to proceed with a event monitor.  Marthe Patch, LPN  07/21/9757, 83:25

## 2019-07-28 DIAGNOSIS — I493 Ventricular premature depolarization: Secondary | ICD-10-CM

## 2019-07-28 DIAGNOSIS — I472 Ventricular tachycardia: Secondary | ICD-10-CM

## 2019-07-29 ENCOUNTER — Ambulatory Visit (INDEPENDENT_AMBULATORY_CARE_PROVIDER_SITE_OTHER): Payer: Self-pay | Admitting: Nurse Practitioner

## 2019-07-29 ENCOUNTER — Encounter (INDEPENDENT_AMBULATORY_CARE_PROVIDER_SITE_OTHER): Payer: Self-pay

## 2019-07-30 ENCOUNTER — Other Ambulatory Visit (INDEPENDENT_AMBULATORY_CARE_PROVIDER_SITE_OTHER): Payer: Self-pay | Admitting: Family

## 2019-08-07 ENCOUNTER — Ambulatory Visit (INDEPENDENT_AMBULATORY_CARE_PROVIDER_SITE_OTHER): Payer: 59

## 2019-08-07 ENCOUNTER — Encounter (INDEPENDENT_AMBULATORY_CARE_PROVIDER_SITE_OTHER): Payer: Self-pay | Admitting: Nurse Practitioner

## 2019-08-07 ENCOUNTER — Other Ambulatory Visit: Payer: Self-pay

## 2019-08-07 ENCOUNTER — Ambulatory Visit: Payer: 59 | Attending: Nurse Practitioner | Admitting: Nurse Practitioner

## 2019-08-07 ENCOUNTER — Encounter

## 2019-08-07 VITALS — BP 150/82 | HR 91 | Temp 97.1°F | Resp 18 | Ht 68.0 in | Wt 331.0 lb

## 2019-08-07 DIAGNOSIS — R609 Edema, unspecified: Secondary | ICD-10-CM | POA: Insufficient documentation

## 2019-08-07 DIAGNOSIS — M79662 Pain in left lower leg: Secondary | ICD-10-CM | POA: Insufficient documentation

## 2019-08-07 DIAGNOSIS — M79661 Pain in right lower leg: Secondary | ICD-10-CM | POA: Insufficient documentation

## 2019-08-07 DIAGNOSIS — I872 Venous insufficiency (chronic) (peripheral): Secondary | ICD-10-CM

## 2019-08-07 NOTE — H&P (Signed)
Name:  [name]   DOB: Oct 08, 1960   MRN: [id number]   Date:  08/07/2019     PCP: Dario Guardian, PA-C     History of Present Illness  [name] is a 58 y.o. male who presents with lower extremity symptoms.  Onset of symptoms began 3 year(s) ago. Symptoms include leg pain that the patient describes as cramping, leg discoloration and swelling.  The patient also admits to leg swelling that is present upon waking in the mornings. The patient's symptoms interfere with normal activities of daily living by causing him to stop and elevate the legs. The patient has tried conservative measures that include  wearing medical grade compression hose of 20-30 mmHg for more than 90 days and daily elevation of the legs higher than the heart. Patient states started wearing compression stockings June 2020. Their symptoms have been refractory to these conservative measures.  Risk factors for venous disease include obesity. The patient reports no previous vein treatment of either leg.      VCSS of RLE: 7  VCSS of LLE: 12    Past Medical History:   Diagnosis Date   . Ankle injury    . Cellulitis 2013   . CPAP (continuous positive airway pressure) dependence    . Diabetes    . Dizzy spells     bradycardia   . Esophageal reflux     takes tums sometimes   . HTN    . Hyperlipidemia    . Knee injury    . Morbid obesity (CMS HCC)    . Nerve damage    . Sleep apnea     c pap; setting 3   . Type 2 diabetes mellitus (CMS HCC)     dx'd 5-6 yrs ago   . Wears glasses          Patient Active Problem List    Diagnosis Date Noted   . Cellulitis, scrotum 09/04/2011   . Sleep apnea 09/04/2011   . Chest pain 07/20/2010   . HTN (hypertension) 07/20/2010   . Diabetes mellitus (CMS HCC) 07/20/2010   . Hyperlipidemia 07/20/2010   . Morbid obesity (CMS HCC) 07/20/2010   . Sleep apnea 07/20/2010      Past Surgical History:   Procedure Laterality Date   . COLONOSCOPY  11/22/2017    Dr Georgian Co   . HX CATARACT REMOVAL Right    . HX CATARACT REMOVAL Left  04/01/2019    Phacoemulsification of cataract left eye with IOL placement (04/01/2019).   Marland Kitchen HX HERNIA REPAIR      Bilateral   . HX HERNIA REPAIR     . HX HERNIA REPAIR      inguinal and umbilical repair    . HX ORCHIOPEXY     . HX OTHER      at age 71 to veins twisted in groin   . HX TONSILLECTOMY     . HX WISDOM TEETH EXTRACTION     . KNEE ARTHROSCOPY Bilateral           Current Outpatient Medications   Medication Sig   . albuterol sulfate (PROAIR HFA) 90 mcg/actuation Inhalation HFA Aerosol Inhaler Take 1-2 Puffs by inhalation Every 6 hours as needed (Patient taking differently: Take 1-2 Puffs by inhalation Every 6 hours as needed Using seasonal)   . Aspirin 81 mg Oral Tablet take 81 mg by mouth Once a day.   . cyanocobalamin, vitamin B-12, (VITAMIN B-12 ORAL) Take by mouth  Once a day   . cyclobenzaprine (FLEXERIL) 10 mg Oral Tablet Take 5 mg by mouth Once a day    . dapagliflozin (FARXIGA) 10 mg Oral Tablet Take 10 mg by mouth Once a day Takes in am   . FLUoxetine (PROZAC) 20 mg Oral Capsule TAKE ONE CAPSULE (20 MG total) BY MOUTH ONCE DAILY FOR 90 DAYS   . fluticasone-salmeterol (ADVAIR) 250-50 mcg/dose Inhalation Disk with Device oral diskus inhaler Take 1 INHALATION by inhalation Twice daily for 90 days (Patient taking differently: Take 1 INHALATION by inhalation Twice per day as needed )   . furosemide (LASIX) 40 mg Oral Tablet TAKE ONE TABLET BY MOUTH ONCE DAILY FOR 90 DAYS   . insulin lispro (HUMALOG KWIKPEN INSULIN) 100 unit/mL Subcutaneous Insulin Pen Three times a day with meals per sliding scale, up to 36 units a day4   . Metformin (GLUCOPHAGE) 1,000 mg Oral Tablet Take 1,000 mg by mouth Twice daily    . metoprolol tartrate (LOPRESSOR) 25 mg Oral Tablet Take 1 Tab (25 mg total) by mouth Every 12 hours for 90 days (Patient taking differently: Take 25 mg by mouth Once a day )   . multivitamin Oral Tablet Take 1 Tab by mouth Once a day   . potassium chloride (KLOR-CON) 10 mEq Oral Tablet Sustained Release  TAKE ONE TABLET BY MOUTH ONCE DAILY WITH FOOD AND lasix   . rosuvastatin (CRESTOR) 20 mg Oral Tablet Take 1 Tab (20 mg total) by mouth Every evening for 90 days   . TRESIBA FLEXTOUCH U-100 100 unit/mL (3 mL) Subcutaneous Insulin Pen 50 Units    . valsartan-hydroCHLOROthiazide (DIOVAN HCT) 320-25 mg Oral Tablet Take 1 Tab by mouth Once a day for 90 days          Allergies   Allergen Reactions   . Tylox [Oxycodone-Acetaminophen] Hives/ Urticaria         Family Medical History:     Problem Relation (Age of Onset)    Cancer Mother    Congestive Heart Failure Maternal Grandmother    Diabetes Paternal Uncle            Social History     Tobacco Use   . Smoking status: Never Smoker   . Smokeless tobacco: Never Used   Substance Use Topics   . Alcohol use: No       Review of Systems  General: Negative for fever, chills, weight changes.  Skin: Negative for lesions, rashes, ulcers.  HEENT: Negative for headache, vision changes, hearing loss, tinnitus, nasal congestion, sore throat, hoarseness.  Neck: No masses. No swelling.  Cardiac: Positive for leg pain and swelling. Negative for chest pain, palpitations, dizziness, syncope, claudication.  Respiratory: No dyspnea at rest, no dyspnea on exertion; no cough or hemoptysis; no orthopnea or PND.  GI: Negative for N/V, abdominal pain, melena, bright red blood per rectum, bloating or changes in bowel habits.  Musculoskeletal: No joint pain, redness, stiffness, or swelling.  No muscle pain or weakness.  Neuro: Negative for focal weakness and/or numbness.  Psychiatric: Negative for depression or anxiety.  Endocrine: Negative cold/heat intolerance, excessive thirst and/or urination.  Hematologic: Negative for anemia, blood clots, easy bruising, enlarged/painful lymph nodes, prolonged bleeding.    BP (!) 150/82 (Site: Right, Patient Position: Sitting, Cuff Size: Adult Large)   Pulse 91   Temp 36.2 C (97.1 F) (Thermal Scan)   Resp 18   Ht 1.727 m ( )   Wt (!) 150 kg (331 lb)  SpO2 97%   BMI 50.33 kg/m       Right Ankle:29.5cm, Right Calf:47.5cm, Right Thigh:67cm, Right Length To Knee:38cm  Left Ankle: 27.5cm, Left Calf:47cm, Left Thigh:66.5cm, Left Length To Knee:38cm    Physical Exam  General: NAD. Alert and oriented x4. Well nourished, well developed.  Skin: Normal skin color, normal temperature  HEENT: Head is normocephalic, atraumatic with no lesions or palpable masses. PERRL; conjuntiva clear. Nares patent.  Neck:  No thyromegaly or JVD.  Peripheral Vascular:  Inspection of the lower extremities reveals leg discoloration and +2 pitting edema bilterally.  Dorsalis pedis pulses are 1+  bilaterally.  CEAP classification: RLE C4, LLE C4.  Musculoskeletal: Normal posture. Normal gait and station.  Neurologic: CN II-XII grossly intact. Good muscle bulk and tone. No sensory deficits.  Psychiatric: Affect is normal and appropriate.    Assesment  1. Peripheral venous insufficiency    Bilateral LE edema: +2 pitting.   No pain.  Skin pigmentation changes bilateral ankles.     Plan   BLE venous reflux study following this appointment.   Follow up appointment with provider to discuss ultrasound results and plan treatment accordingly   Patient instructed to continue 20-30 mm Hg medical grade compression stockings daily   Patient instructed to continue lower extremity exercise focusing on calf muscles.   Patient informed to call the office with any questions or concerns.   The patient was given the opportunity to ask questions and these questions were appropriately answered. The patient agreed with the treatment plan and is encouraged to call with any additional questions or concerns.     I am scribing for, and in the presence of Beau Fanny, APRN for services on 08/07/2019. [place], [place] 08/07/2019, 13:41.      Ty Hilts, APRN  08/07/2019, 14:05

## 2019-08-26 ENCOUNTER — Other Ambulatory Visit: Payer: Self-pay

## 2019-08-28 ENCOUNTER — Ambulatory Visit (INDEPENDENT_AMBULATORY_CARE_PROVIDER_SITE_OTHER): Payer: Self-pay | Admitting: Nurse Practitioner

## 2019-08-31 ENCOUNTER — Other Ambulatory Visit (INDEPENDENT_AMBULATORY_CARE_PROVIDER_SITE_OTHER): Payer: Self-pay | Admitting: Family

## 2019-09-03 ENCOUNTER — Encounter (INDEPENDENT_AMBULATORY_CARE_PROVIDER_SITE_OTHER): Payer: Self-pay | Admitting: Cardiovascular Disease

## 2019-09-04 ENCOUNTER — Other Ambulatory Visit: Payer: Self-pay

## 2019-09-04 ENCOUNTER — Encounter (INDEPENDENT_AMBULATORY_CARE_PROVIDER_SITE_OTHER): Payer: Self-pay | Admitting: Nurse Practitioner

## 2019-09-04 ENCOUNTER — Ambulatory Visit: Payer: 59 | Attending: Nurse Practitioner | Admitting: Nurse Practitioner

## 2019-09-04 VITALS — BP 164/80 | HR 98 | Temp 97.8°F | Resp 20 | Ht 68.0 in | Wt 331.0 lb

## 2019-09-04 DIAGNOSIS — I872 Venous insufficiency (chronic) (peripheral): Secondary | ICD-10-CM

## 2019-09-04 DIAGNOSIS — R609 Edema, unspecified: Secondary | ICD-10-CM | POA: Insufficient documentation

## 2019-09-04 NOTE — H&P (Signed)
Name:  [name]   DOB: Sep 18, 1960   MRN: [id number]   Date:  09/04/2019     PCP: Dario Guardian, PA-C     History of Present Illness  [name] is a 59 y.o. male who presents with lower extremity symptoms.  Onset of symptoms began 3 year(s) ago. Symptoms include leg pain that the patient describes as cramping, leg discoloration and swelling.  The patient also admits to leg swelling that is present upon waking in the mornings. The patient's symptoms interfere with normal activities of daily living by causing him to stop and elevate the legs. The patient has tried conservative measures that include  wearing medical grade compression hose of 20-30 mmHg for more than 90 days and daily elevation of the legs higher than the heart. Patient states started wearing compression stockings June 2020. Their symptoms have been refractory to these conservative measures.  Risk factors for venous disease include obesity. The patient reports no previous vein treatment of either leg.      VCSS of RLE: 7  VCSS of LLE: 12    Past Medical History:   Diagnosis Date   . Ankle injury    . Cellulitis 2013   . CPAP (continuous positive airway pressure) dependence    . Diabetes    . Dizzy spells     bradycardia   . Esophageal reflux     takes tums sometimes   . HTN    . Hyperlipidemia    . Knee injury    . Morbid obesity (CMS HCC)    . Nerve damage    . Sleep apnea     c pap; setting 3   . Type 2 diabetes mellitus (CMS HCC)     dx'd 5-6 yrs ago   . Wears glasses          Patient Active Problem List    Diagnosis Date Noted   . Cellulitis, scrotum 09/04/2011   . Sleep apnea 09/04/2011   . Chest pain 07/20/2010   . HTN (hypertension) 07/20/2010   . Diabetes mellitus (CMS HCC) 07/20/2010   . Hyperlipidemia 07/20/2010   . Morbid obesity (CMS HCC) 07/20/2010   . Sleep apnea 07/20/2010      Past Surgical History:   Procedure Laterality Date   . COLONOSCOPY  11/22/2017    Dr Georgian Co   . HX CATARACT REMOVAL Right    . HX CATARACT REMOVAL Left  04/01/2019    Phacoemulsification of cataract left eye with IOL placement (04/01/2019).   Marland Kitchen HX HERNIA REPAIR      Bilateral   . HX HERNIA REPAIR     . HX HERNIA REPAIR      inguinal and umbilical repair    . HX ORCHIOPEXY     . HX OTHER      at age 50 to veins twisted in groin   . HX TONSILLECTOMY     . HX WISDOM TEETH EXTRACTION     . KNEE ARTHROSCOPY Bilateral           Current Outpatient Medications   Medication Sig   . albuterol sulfate (PROAIR HFA) 90 mcg/actuation Inhalation HFA Aerosol Inhaler Take 1-2 Puffs by inhalation Every 6 hours as needed (Patient taking differently: Take 1-2 Puffs by inhalation Every 6 hours as needed Using seasonal)   . Aspirin 81 mg Oral Tablet take 81 mg by mouth Once a day.   . cyanocobalamin, vitamin B-12, (VITAMIN B-12 ORAL) Take by mouth  Once a day   . cyclobenzaprine (FLEXERIL) 10 mg Oral Tablet Take 5 mg by mouth Once a day    . dapagliflozin (FARXIGA) 10 mg Oral Tablet Take 10 mg by mouth Once a day Takes in am   . FLUoxetine (PROZAC) 20 mg Oral Capsule TAKE ONE CAPSULE (20 MG total) BY MOUTH ONCE DAILY FOR 90 DAYS   . fluticasone-salmeterol (ADVAIR) 250-50 mcg/dose Inhalation Disk with Device oral diskus inhaler Take 1 INHALATION by inhalation Twice daily for 90 days (Patient taking differently: Take 1 INHALATION by inhalation Twice per day as needed )   . furosemide (LASIX) 40 mg Oral Tablet TAKE ONE TABLET BY MOUTH ONCE DAILY FOR 90 DAYS   . insulin lispro (HUMALOG KWIKPEN INSULIN) 100 unit/mL Subcutaneous Insulin Pen Three times a day with meals per sliding scale, up to 36 units a day4   . Metformin (GLUCOPHAGE) 1,000 mg Oral Tablet Take 1,000 mg by mouth Twice daily    . metoprolol tartrate (LOPRESSOR) 25 mg Oral Tablet Take 1 Tab (25 mg total) by mouth Every 12 hours for 90 days (Patient taking differently: Take 25 mg by mouth Once a day )   . multivitamin Oral Tablet Take 1 Tab by mouth Once a day   . potassium chloride (KLOR-CON) 10 mEq Oral Tablet Sustained Release  TAKE ONE TABLET BY MOUTH ONCE DAILY WITH FOOD AND lasix   . rosuvastatin (CRESTOR) 20 mg Oral Tablet Take 1 Tab (20 mg total) by mouth Every evening for 90 days   . TRESIBA FLEXTOUCH U-100 100 unit/mL (3 mL) Subcutaneous Insulin Pen 50 Units    . valsartan-hydroCHLOROthiazide (DIOVAN HCT) 320-25 mg Oral Tablet Take 1 Tab by mouth Once a day for 90 days          Allergies   Allergen Reactions   . Tylox [Oxycodone-Acetaminophen] Hives/ Urticaria         Family Medical History:     Problem Relation (Age of Onset)    Cancer Mother    Congestive Heart Failure Maternal Grandmother    Diabetes Paternal Uncle            Social History     Tobacco Use   . Smoking status: Never Smoker   . Smokeless tobacco: Never Used   Substance Use Topics   . Alcohol use: No       Review of Systems  General: Negative for fever, chills, weight changes.  Skin: Negative for lesions, rashes, ulcers.  HEENT: Negative for headache, vision changes, hearing loss, tinnitus, nasal congestion, sore throat, hoarseness.  Neck: No masses. No swelling.  Cardiac: Positive for leg pain and swelling. Negative for chest pain, palpitations, dizziness, syncope, claudication.  Respiratory: No dyspnea at rest, no dyspnea on exertion; no cough or hemoptysis; no orthopnea or PND.  GI: Negative for N/V, abdominal pain, melena, bright red blood per rectum, bloating or changes in bowel habits.  Musculoskeletal: No joint pain, redness, stiffness, or swelling.  No muscle pain or weakness.  Neuro: Negative for focal weakness and/or numbness.  Psychiatric: Negative for depression or anxiety.  Endocrine: Negative cold/heat intolerance, excessive thirst and/or urination.  Hematologic: Negative for anemia, blood clots, easy bruising, enlarged/painful lymph nodes, prolonged bleeding.    BP (!) 164/80 (Site: Left, Patient Position: Sitting, Cuff Size: Adult Large)   Pulse 98   Temp 36.6 C (97.8 F)   Resp 20   Ht 1.727 m (5\' 8" )   Wt (!) 150 kg (331 lb)  SpO2 97%    BMI 50.33 kg/m       Right Ankle:29.5cm, Right Calf:47.5cm, Right Thigh:67cm, Right Length To Knee:38cm  Left Ankle: 27.5cm, Left Calf:47cm, Left Thigh:66.5cm, Left Length To Knee:38cm    Physical Exam  General: NAD. Alert and oriented x4. Well nourished, well developed.  Skin: Normal skin color, normal temperature  HEENT: Head is normocephalic, atraumatic with no lesions or palpable masses. PERRL; conjuntiva clear. Nares patent.  Neck:  No thyromegaly or JVD.  Peripheral Vascular:  Inspection of the lower extremities reveals leg discoloration and +2 pitting edema bilterally.  Dorsalis pedis pulses are 1+  Bilaterally. Patient's previous ultrasound was reviewed by me to plan treatment accordingly.   CEAP classification: RLE C4, LLE C4.  Musculoskeletal: Normal posture. Normal gait and station.  Neurologic: CN II-XII grossly intact. Good muscle bulk and tone. No sensory deficits.  Psychiatric: Affect is normal and appropriate.    Assesment  No diagnosis found.Bilateral LE edema: +2 pitting.   No pain.  Skin pigmentation changes bilateral ankles.     Plan   No procedure indicated at this time.   Follow up in 6 months for repeat ultrasound and appointment with provider to discuss ultrasound results and plan treatment accordingly   Patient instructed to continue 20-30 mm Hg medical grade compression stockings daily   Patient instructed to continue lower extremity exercise focusing on calf muscles.   Patient informed to call the office with any questions or concerns.   The patient was given the opportunity to ask questions and these questions were appropriately answered. The patient agreed with the treatment plan and is encouraged to call with any additional questions or concerns.     I am scribing for, and in the presence of Beau Fanny, APRN for services on 09/04/2019. [place], [place] 09/04/2019, 13:37.      Ty Hilts, APRN  09/04/2019, 14:01

## 2019-09-17 ENCOUNTER — Encounter (INDEPENDENT_AMBULATORY_CARE_PROVIDER_SITE_OTHER): Payer: Self-pay | Admitting: Cardiovascular Disease

## 2019-10-01 ENCOUNTER — Other Ambulatory Visit (INDEPENDENT_AMBULATORY_CARE_PROVIDER_SITE_OTHER): Payer: Self-pay | Admitting: Family

## 2019-10-01 DIAGNOSIS — F39 Unspecified mood [affective] disorder: Secondary | ICD-10-CM

## 2019-10-10 ENCOUNTER — Other Ambulatory Visit (INDEPENDENT_AMBULATORY_CARE_PROVIDER_SITE_OTHER): Payer: Self-pay | Admitting: Family

## 2019-10-10 DIAGNOSIS — F39 Unspecified mood [affective] disorder: Secondary | ICD-10-CM

## 2019-10-29 ENCOUNTER — Ambulatory Visit: Payer: 59 | Attending: Cardiovascular Disease | Admitting: Cardiovascular Disease

## 2019-10-29 ENCOUNTER — Encounter (INDEPENDENT_AMBULATORY_CARE_PROVIDER_SITE_OTHER): Payer: Self-pay | Admitting: Cardiovascular Disease

## 2019-10-29 ENCOUNTER — Other Ambulatory Visit: Payer: Self-pay

## 2019-10-29 VITALS — BP 138/70 | HR 87 | Temp 98.4°F | Ht 68.0 in | Wt 318.4 lb

## 2019-10-29 DIAGNOSIS — R000 Tachycardia, unspecified: Secondary | ICD-10-CM | POA: Insufficient documentation

## 2019-10-29 DIAGNOSIS — R42 Dizziness and giddiness: Secondary | ICD-10-CM

## 2019-10-29 DIAGNOSIS — I10 Essential (primary) hypertension: Secondary | ICD-10-CM | POA: Insufficient documentation

## 2019-10-29 NOTE — Progress Notes (Signed)
[facility]  [address]  [place] [address]  Phone: [phone]  Fax: [phone]    Encounter Date: 10/29/2019    Patient ID:  [name]  EBX:[id number]    DOB: 1961/05/10  Age: 59 y.o. male    Subjective:     Chief Complaint   Patient presents with   . Follow-up     two months     HPI: Patient returns for follow-up.  We had seen him because of some dizziness and tachycardia at his last visit.  We arranged a Holter monitor that really did not show any significant arrhythmias.  His cardiac workup in the past has been unremarkable having had a normal nuclear stress test and echo in the past.  He says he feels better.  Over the last couple of months the dizziness really has improved and he feels like he is back to his baseline.  Overall he was without any acute complaints or problems today.  Current Outpatient Medications   Medication Sig   . albuterol sulfate (PROAIR HFA) 90 mcg/actuation Inhalation HFA Aerosol Inhaler Take 1-2 Puffs by inhalation Every 6 hours as needed (Patient taking differently: Take 1-2 Puffs by inhalation Every 6 hours as needed Using seasonal)   . Aspirin 81 mg Oral Tablet take 81 mg by mouth Once a day.   . cyanocobalamin, vitamin B-12, (VITAMIN B-12 ORAL) Take by mouth Once a day   . cyclobenzaprine (FLEXERIL) 10 mg Oral Tablet Take 5 mg by mouth Once a day    . dapagliflozin (FARXIGA) 10 mg Oral Tablet Take 10 mg by mouth Once a day Takes in am   . FLUoxetine (PROZAC) 20 mg Oral Capsule TAKE ONE CAPSULE (20 MG total) BY MOUTH ONCE DAILY FOR 90 DAYS   . fluticasone-salmeterol (ADVAIR) 250-50 mcg/dose Inhalation Disk with Device oral diskus inhaler Take 1 INHALATION by inhalation Twice daily for 90 days (Patient taking differently: Take 1 INHALATION by inhalation Twice per day as needed )   . furosemide (LASIX) 40 mg Oral Tablet TAKE ONE TABLET BY MOUTH ONCE DAILY FOR 90 DAYS   . insulin lispro (HUMALOG KWIKPEN INSULIN) 100 unit/mL Subcutaneous Insulin Pen Three times a  day with meals per sliding scale, up to 36 units a day4   . Metformin (GLUCOPHAGE) 1,000 mg Oral Tablet Take 1,000 mg by mouth Twice daily    . metoprolol tartrate (LOPRESSOR) 25 mg Oral Tablet Take 1 Tab (25 mg total) by mouth Every 12 hours for 90 days (Patient taking differently: Take 25 mg by mouth Once a day )   . multivitamin Oral Tablet Take 1 Tab by mouth Once a day   . potassium chloride (KLOR-CON) 10 mEq Oral Tablet Sustained Release TAKE ONE TABLET BY MOUTH ONCE DAILY WITH FOOD AND lasix   . rosuvastatin (CRESTOR) 20 mg Oral Tablet Take 1 Tab (20 mg total) by mouth Every evening for 90 days   . TRESIBA FLEXTOUCH U-100 100 unit/mL (3 mL) Subcutaneous Insulin Pen 50 Units    . valsartan-hydroCHLOROthiazide (DIOVAN HCT) 320-25 mg Oral Tablet Take 1 Tab by mouth Once a day for 90 days     Allergies   Allergen Reactions   . Tylox [Oxycodone-Acetaminophen] Hives/ Urticaria     Past Medical History:   Diagnosis Date   . Ankle injury    . Cellulitis 2013   . CPAP (continuous positive airway pressure) dependence    . Diabetes    . Dizzy spells  bradycardia   . Esophageal reflux     takes tums sometimes   . HTN    . Hyperlipidemia    . Knee injury    . Morbid obesity (CMS [place])    . Nerve damage    . Sleep apnea     c pap; setting 3   . Type 2 diabetes mellitus (CMS HCC)     dx'd 5-6 yrs ago   . Wears glasses          Past Surgical History:   Procedure Laterality Date   . COLONOSCOPY  11/22/2017    Dr Addison Bailey   . HX CATARACT REMOVAL Right    . HX CATARACT REMOVAL Left 04/01/2019    Phacoemulsification of cataract left eye with IOL placement (04/01/2019).   Marland Kitchen HX HERNIA REPAIR      Bilateral   . HX HERNIA REPAIR     . HX HERNIA REPAIR      inguinal and umbilical repair    . HX ORCHIOPEXY     . HX OTHER      at age 18 to veins twisted in groin   . HX TONSILLECTOMY     . HX WISDOM TEETH EXTRACTION     . KNEE ARTHROSCOPY Bilateral          Family Medical History:     Problem Relation (Age of Onset)    Cancer Mother     Congestive Heart Failure Maternal Grandmother    Diabetes Paternal Uncle            Social History     Tobacco Use   . Smoking status: Never Smoker   . Smokeless tobacco: Never Used   Vaping Use   . Vaping Use: Never used   Substance Use Topics   . Alcohol use: No   . Drug use: No       Review of Systems   Constitutional: Negative for chills and fever.   HENT: Negative for nosebleeds.    Respiratory: Negative for chest tightness and shortness of breath.    Cardiovascular: Negative for chest pain, palpitations and leg swelling.   Gastrointestinal: Negative for abdominal pain and blood in stool.   Genitourinary: Negative.    Musculoskeletal: Negative for arthralgias and myalgias.   Skin: Negative for rash.   Neurological: Negative for dizziness, syncope and light-headedness.   Psychiatric/Behavioral: Negative for confusion.     Objective:   Vitals: BP 138/70 (Site: Left, Patient Position: Sitting, Cuff Size: Adult Large)   Pulse 87   Temp 36.9 C (98.4 F)   Ht 1.727 m (5\' 8" )   Wt (!) 144 kg (318 lb 6.4 oz)   SpO2 96%   BMI 48.41 kg/m         Physical Exam  Vitals and nursing note reviewed.   Constitutional:       Appearance: He is well-developed.   HENT:      Head: Normocephalic and atraumatic.   Cardiovascular:      Rate and Rhythm: Regular rhythm.      Heart sounds: No murmur.   Pulmonary:      Effort: Pulmonary effort is normal.      Breath sounds: Normal breath sounds.   Abdominal:      General: Bowel sounds are normal.      Palpations: Abdomen is soft.      Tenderness: There is no abdominal tenderness.   Musculoskeletal:      Cervical back: Normal range of motion  and neck supple.   Skin:     General: Skin is warm and dry.   Neurological:      Mental Status: He is alert and oriented to person, place, and time.   Psychiatric:         Behavior: Behavior normal.         Assessment & Plan:     ENCOUNTER DIAGNOSES     ICD-10-CM   1. Dizziness   -Resolved R42   2. HTN (hypertension)  -well controlled I10   3.  Tachycardia  -no significant arrhythmias on Holter R00.0      plan:  Overall he stable.  I reassured him that he does not have any evidence of significant cardiac problems at this point and as long as he continues to remain asymptomatic no further workup is needed at this time.  I did stress the importance of risk factor modification and making sure he follows up with his PCP.  We will see him back on an as-needed basis  No orders of the defined types were placed in this encounter.      Return if symptoms worsen or fail to improve.    Mariea Stable, MD

## 2019-10-30 ENCOUNTER — Other Ambulatory Visit (INDEPENDENT_AMBULATORY_CARE_PROVIDER_SITE_OTHER): Payer: Self-pay | Admitting: Family

## 2019-12-10 ENCOUNTER — Other Ambulatory Visit (INDEPENDENT_AMBULATORY_CARE_PROVIDER_SITE_OTHER): Payer: Self-pay | Admitting: Family

## 2019-12-10 NOTE — Telephone Encounter (Signed)
This is not my patient. Looks like he sees [facility]

## 2020-03-04 ENCOUNTER — Encounter (INDEPENDENT_AMBULATORY_CARE_PROVIDER_SITE_OTHER): Payer: Self-pay

## 2020-03-04 ENCOUNTER — Ambulatory Visit (INDEPENDENT_AMBULATORY_CARE_PROVIDER_SITE_OTHER): Payer: Self-pay | Admitting: Nurse Practitioner

## 2020-04-20 ENCOUNTER — Encounter (INDEPENDENT_AMBULATORY_CARE_PROVIDER_SITE_OTHER): Payer: Self-pay

## 2020-04-20 ENCOUNTER — Encounter (INDEPENDENT_AMBULATORY_CARE_PROVIDER_SITE_OTHER): Payer: Self-pay | Admitting: Nurse Practitioner

## 2021-12-20 ENCOUNTER — Other Ambulatory Visit: Payer: Self-pay

## 2021-12-20 ENCOUNTER — Encounter (INDEPENDENT_AMBULATORY_CARE_PROVIDER_SITE_OTHER): Payer: Self-pay | Admitting: Family

## 2021-12-20 ENCOUNTER — Ambulatory Visit (INDEPENDENT_AMBULATORY_CARE_PROVIDER_SITE_OTHER): Payer: Commercial Managed Care - PPO | Admitting: Family

## 2021-12-20 VITALS — BP 164/84 | HR 94 | Ht 68.0 in | Wt 317.0 lb

## 2021-12-20 DIAGNOSIS — Z7984 Long term (current) use of oral hypoglycemic drugs: Secondary | ICD-10-CM

## 2021-12-20 DIAGNOSIS — E785 Hyperlipidemia, unspecified: Secondary | ICD-10-CM

## 2021-12-20 DIAGNOSIS — Z794 Long term (current) use of insulin: Secondary | ICD-10-CM

## 2021-12-20 DIAGNOSIS — Z7985 Long-term (current) use of injectable non-insulin antidiabetic drugs: Secondary | ICD-10-CM

## 2021-12-20 DIAGNOSIS — E1165 Type 2 diabetes mellitus with hyperglycemia: Secondary | ICD-10-CM

## 2021-12-20 DIAGNOSIS — I10 Essential (primary) hypertension: Secondary | ICD-10-CM

## 2021-12-20 MED ORDER — MOUNJARO 2.5 MG/0.5 ML SUBCUTANEOUS PEN INJECTOR
2.50 mg | PEN_INJECTOR | SUBCUTANEOUS | 0 refills | Status: DC
Start: 2021-12-20 — End: 2022-01-23

## 2021-12-20 NOTE — Progress Notes (Signed)
[facility] Diabetes Progress Note    Date: 12/20/2021  Name:  [name]   Age: 61 y.o.  Attending: Lilli Light, FNP-BC  PCP: Sherene Sires, NP    Assessment/Recommendations:     Uncontrolled type 2 diabetes mellitus with hyperglycemia (CMS [place]):  Goal of A1C and glucose discussed.  Healthy plate method, CHO counting and limit to 4 servings/meal, 1 serving/snack reviewed.   Willing to try freestyle libre 3. Sensor started RUA. Discussed need to check fsbs if symptoms not matching reading or after low sugar treatment. Low alarm 70, high alarm 350 (pt will decrease down to 250 after few days).  Continue Metformin.  Pt wants Rx help with weight loss, check insurance coverage of Mounjaro 2.5 mg weekly. Denies hx of pancreatitis/thyroid cancer (hx or fhx). Discontinue Rybelsus. If not able to get Mounjaro, increase Rybelsus to 2 tabs of 3 mg, then Rx for 7 mg.  Continue Basaglar.  Change Humalog to 8 plus 2/50 > 150 AC, start 2/50 > 150 HS.  Hypoglycemia/hyperglycemia symptoms and management discussed.   Consider adding back SGLT-2 next visit.     Essential hypertension: Valsartan/HCTZ and norvasc.     Hyperlipidemia, unspecified hyperlipidemia type: Crestor.     Other orders  -     tirzepatide (MOUNJARO) 2.5 mg/0.5 mL Subcutaneous Pen Injector; Inject 0.5 mL (2.5 mg total) under the skin Every 7 days      CMP (eGFR > 60), A1C 09/2021 reviewed.    On the day of the encounter, a total of  65 minutes was spent on this patient encounter including review of historical information, examination, documentation and post-visit activities. The time documented excludes procedural time.    No follow-ups on file.    Chief Complaint:    Chief Complaint   Patient presents with   . Diabetes   . New Patient     Uncontrolled type II DM       History of Present Illness:  Onset: in his early 2s  CDE: none  Complications: mild neuropathy, ? DR   A1C: 14.4% 10/06/2021 <-- 13% 11/2020   Rx: Metformin 1000  mg bid, Rybelsus 3 mg daily, Basaglar 36 units qhs, Humalog SS 4-20 units AC only, tried on Bydureon, tolerating Farxiga,  Ozempic in past  FSBS: 3 times daily, 300s, 334 this am, took Humalog 12 units, 280 in office  Diet: rare regular soda, 3 meals  Exercise: little due to knee  Hypoglycemia: none  Statin: Crestor  Eye exam: has f/u 12/2021  Urine microalbumin/creatinine  Foot exam:  Diabetes Monitors  A1C - Glucose - Lipids Microalbumin   No results for input(s): HA1C, POCTHA1C, GLUCOSEFAST, CHOLESTEROL, HDLCHOL, LDLCHOL, LDLCHOLDIR, TRIG in the last 13140 hours. No results for input(s): MICALBRNUR, MICALBCRERAT in the last 13140 hours.     Retinal Exam Date: 03/11/2019  Last Foot Exam: Not Found      Allergies:  Allergies   Allergen Reactions   . Tylox [Oxycodone-Acetaminophen] Hives/ Urticaria       Medications:    Outpatient Medications Marked as Taking for the 12/20/21 encounter (Office Visit) with Lilli Light, FNP-BC   Medication Sig   . amLODIPine (NORVASC) 5 mg Oral Tablet Take 1 Tablet (5 mg total) by mouth Once a day   . Aspirin 81 mg Oral Tablet take 81 mg by mouth Once a day.   . cyanocobalamin, vitamin B-12, (VITAMIN B-12 ORAL) Take by mouth Once a day   . cyclobenzaprine (  FLEXERIL) 10 mg Oral Tablet Take 0.5 Tablets (5 mg total) by mouth Once a day   . FLUoxetine (PROZAC) 20 mg Oral Capsule TAKE ONE CAPSULE (20 MG total) BY MOUTH ONCE DAILY FOR 90 DAYS   . fluticasone-salmeterol (ADVAIR) 250-50 mcg/dose Inhalation Disk with Device oral diskus inhaler Take 1 INHALATION by inhalation Twice daily for 90 days (Patient taking differently: Take 1 Puff (1 INHALATION total) by inhalation Twice per day as needed)   . furosemide (LASIX) 40 mg Oral Tablet TAKE ONE TABLET BY MOUTH ONCE DAILY FOR 90 DAYS   . insulin glargine (BASAGLAR KWIKPEN U-100 INSULIN) 100 unit/mL Subcutaneous Insulin Pen Inject 36 Units under the skin Every night   . insulin lispro (HUMALOG KWIKPEN INSULIN) 100 unit/mL Subcutaneous Insulin  Pen Three times a day with meals per sliding scale, up to 36 units a day4   . Metformin (GLUCOPHAGE) 1,000 mg Oral Tablet Take 1 Tablet (1,000 mg total) by mouth Twice daily   . multivitamin Oral Tablet Take 1 Tablet by mouth Once a day   . potassium chloride (KLOR-CON) 10 mEq Oral Tablet Sustained Release TAKE ONE TABLET BY MOUTH ONCE DAILY WITH FOOD AND lasix   . tirzepatide (MOUNJARO) 2.5 mg/0.5 mL Subcutaneous Pen Injector Inject 0.5 mL (2.5 mg total) under the skin Every 7 days       Past Medical History:  Past Medical History:   Diagnosis Date   . Ankle injury    . Cellulitis 2013   . CPAP (continuous positive airway pressure) dependence    . Diabetes    . Dizzy spells     bradycardia   . Esophageal reflux     takes tums sometimes   . HTN    . Hyperlipidemia    . Knee injury    . Morbid obesity (CMS [place])    . Nerve damage    . Sleep apnea     c pap; setting 3   . Type 2 diabetes mellitus (CMS HCC)     dx'd 5-6 yrs ago   . Wears glasses          Past Surgical History:   Procedure Laterality Date   . COLONOSCOPY  11/22/2017    Dr Addison Bailey   . HX CATARACT REMOVAL Right    . HX CATARACT REMOVAL Left 04/01/2019    Phacoemulsification of cataract left eye with IOL placement (04/01/2019).   Marland Kitchen HX HERNIA REPAIR      Bilateral   . HX HERNIA REPAIR     . HX HERNIA REPAIR      inguinal and umbilical repair    . HX ORCHIOPEXY     . HX OTHER      at age 39 to veins twisted in groin   . HX TONSILLECTOMY     . HX WISDOM TEETH EXTRACTION     . KNEE ARTHROSCOPY Bilateral            Social History:    Social History     Socioeconomic History   . Marital status: Married     Spouse name: CAROLYN   . Number of children: 1   Occupational History     Employer: [employer]   Tobacco Use   . Smoking status: Never   . Smokeless tobacco: Never   Vaping Use   . Vaping Use: Never used   Substance and Sexual Activity   . Alcohol use: No   . Drug use: No   Other Topics  Concern   . Right hand dominant Yes   . Drives Yes   . Seat  Belt Yes   . Ability to Walk 1 Flight of Steps without SOB/CP Yes   . Routine Exercise No   . Ability to Walk 2 Flight of Steps without SOB/CP Yes   . Ability To Do Own ADL's Yes   . Other Activity Level Yes   Social History Narrative    ** Merged History Encounter **            Family History:  Family Medical History:     Problem Relation (Age of Onset)    Cancer Mother    Congestive Heart Failure Maternal Grandmother    Diabetes Paternal Uncle            Review of Systems:  Constitutional: negative for fevers, chills, sweats, rash, weight loss, fatigue  HEENT: negative for headache, decreased vision, blurred vision.  Neck: negative for mass, pain, swelling  Respiratory: negative for cough, wheezing, dyspnea on exertion  Cardiovascular: negative for chest pain, chest pressure/discomfort,  palpitations, orthopnea, paroxysmal nocturnal dyspnea and lower extremity edema  Gastrointestinal: negative for dysphagia, reflux symptoms, nausea, vomiting, melena, diarrhea, constipation, abdominal pain  Genitourinary: negative for frequency, dysuria, nocturia, urinary incontinence, hesitancy, hematuria  Hematologic/lymphatic: negative for easy bruising or bleeding, anemia  Musculoskeletal: negative for muscle weakness, myalgias, arthralgias, back pain   Neurological: negative for headaches, dizziness, seizures, speech problems, head injury and weakness, burning, numbness   Psychiatric: negative for depression, anxiety  Endocrine: negative for polyuria, polydipsia, heat or cold intolerance    Objective     Vital Signs:  BP (!) 164/84   Pulse 94   Ht 1.727 m (_0 )   Wt (!) 144 kg (317 lb)   SpO2 94%   BMI 48.20 kg/m         Examination:  General:  No acute distress  HEENT:  Eyes clear.    Neck:  Supple.  Cardiac:  Regular rate and rhythm, no gallops, murmurs or rubs.  Pulmonary:  Clear to ausculation bilaterally.  Pulmonary effort normal.  Gastrointestinal:  Nontender, nondistended, bowel sounds positive.     Musculoskeletal:  No deformity or lesions.  Skin:  Warm and of normal moisture.  Neurologic:  Alert and oriented.  Nonfocal.  No tremor.    Psychiatric:  Affect normal.  Diabetic foot exam:            Diagnostic Review:      Lab Results   Component Value Date    HA1C 12.5 (H) 10/02/2018     Lab Results   Component Value Date    TSH 1.494 10/02/2018     TSH (uIU/mL)   Date Value   10/02/2018 1.494       Lilli Light FNP-BC

## 2021-12-29 ENCOUNTER — Telehealth (INDEPENDENT_AMBULATORY_CARE_PROVIDER_SITE_OTHER): Payer: Self-pay | Admitting: Family

## 2021-12-29 NOTE — Telephone Encounter (Signed)
Called pt back and had to leave a vm to give a call back to explain the process for the [place] 3 having to be sent to DME supplier; [facility]; awaiting call back  Danne Baxter, RN  12/29/2021, 11:03

## 2022-01-21 ENCOUNTER — Other Ambulatory Visit (INDEPENDENT_AMBULATORY_CARE_PROVIDER_SITE_OTHER): Payer: Self-pay | Admitting: Family

## 2022-01-21 DIAGNOSIS — E1165 Type 2 diabetes mellitus with hyperglycemia: Secondary | ICD-10-CM

## 2022-02-01 ENCOUNTER — Other Ambulatory Visit: Payer: Self-pay

## 2022-02-01 ENCOUNTER — Ambulatory Visit (INDEPENDENT_AMBULATORY_CARE_PROVIDER_SITE_OTHER): Payer: Commercial Managed Care - PPO | Admitting: Family

## 2022-02-01 ENCOUNTER — Encounter (INDEPENDENT_AMBULATORY_CARE_PROVIDER_SITE_OTHER): Payer: Self-pay | Admitting: Family

## 2022-02-01 VITALS — BP 140/78 | HR 90 | Resp 18 | Ht 68.0 in | Wt 309.0 lb

## 2022-02-01 DIAGNOSIS — Z794 Long term (current) use of insulin: Secondary | ICD-10-CM

## 2022-02-01 DIAGNOSIS — E1140 Type 2 diabetes mellitus with diabetic neuropathy, unspecified: Secondary | ICD-10-CM

## 2022-02-01 DIAGNOSIS — E1165 Type 2 diabetes mellitus with hyperglycemia: Secondary | ICD-10-CM

## 2022-02-01 DIAGNOSIS — E785 Hyperlipidemia, unspecified: Secondary | ICD-10-CM

## 2022-02-01 DIAGNOSIS — I10 Essential (primary) hypertension: Secondary | ICD-10-CM

## 2022-02-01 DIAGNOSIS — Z7984 Long term (current) use of oral hypoglycemic drugs: Secondary | ICD-10-CM

## 2022-02-01 LAB — POCT HGB A1C: POCT HGB A1C: 9.4 % — AB (ref 4–6)

## 2022-02-01 MED ORDER — RYBELSUS 14 MG TABLET
14.0000 mg | ORAL_TABLET | Freq: Every day | ORAL | 0 refills | Status: DC
Start: 2022-02-01 — End: 2022-03-03

## 2022-02-01 MED ORDER — FREESTYLE LIBRE 2 SENSOR KIT
PACK | 3 refills | Status: DC
Start: 2022-02-01 — End: 2023-02-21

## 2022-02-01 NOTE — Progress Notes (Signed)
[facility] Progress Note    Date: 02/01/2022  Name:  [name]   Age: 61 y.o.  Attending: Lilli Light, FNP-BC  PCP: Sherene Sires, NP    Assessment/Recommendations:     Uncontrolled type 2 diabetes mellitus with hyperglycemia (CMS [facility]):  Not able to afford Mounjaro.  Not able to afford libre 3 with DME, check libre 2.  Continue Metformin.  Increase Rybelsus to 14 mg if tolerating.  Titrate up Basaglar to 40 --> 45 --> 50 to keep fasting glucose < 130.  Change Humalog to 10 plus 2/50 > 150 AC,  2/50 > 150 HS.    Essential hypertension: norvasc. Trend MA.     Hyperlipidemia, unspecified hyperlipidemia type: Crestor. Trend level.       Libre 3: no lows, 8% in range, 55% high and 37% very high. Average for 2 weeks trial 5/3-5/16 238. Fasting mid 100- low 200's, after meals 200- 300s.    Return in about 3 months (around 05/04/2022) for In Person Visit.    Chief Complaint:    Chief Complaint   Patient presents with   . Diabetes Follow up   . Hypertension   . Hyperlipidemia     History of Present Illness:  Onset: in his early 24s  CDE: none  Complications: mild neuropathy, ? DR   A1C: 9.4% 02/01/2022 <--14.4% 10/06/2021 <-- 13% 11/2020   Rx: Metformin 1000 mg bid, Rybelsus 3 mg 2 tabs daily, Basaglar 36 units qhs, Humalog SS , tried on Bydureon, tolerating Farxiga,  Ozempic in past  FSBS: libre 3 sample, 100- low 300s  Diet: less CHO  Exercise: little due to knee  Hypoglycemia: none  Statin: Crestor  Eye exam:  12/2021  Urine microalbumin/creatinine  Foot exam:  Diabetes Monitors  A1C - Glucose - Lipids Microalbumin   Results in Last 18 Months   Lab Test 02/01/22  0000   POCTHA1C 9.4*    No results for input(s): MICALBRNUR, MICALBCRERAT in the last 13140 hours.     Retinal Exam Date: 03/11/2019  Last Foot Exam: Not Found      Allergies:  Allergies   Allergen Reactions   . Acetaminophen      Other reaction(s): Mild hives   . Oxycodone ([place])      Other reaction(s): Mild hives    . Tylox [Oxycodone-Acetaminophen] Hives/ Urticaria       Medications:    Outpatient Medications Marked as Taking for the 02/01/22 encounter (Office Visit) with Lilli Light, FNP-BC   Medication Sig   . albuterol sulfate (PROAIR HFA) 90 mcg/actuation Inhalation HFA Aerosol Inhaler Take 1-2 Puffs by inhalation Every 6 hours as needed (Patient taking differently: Take 1-2 Puffs by inhalation Every 6 hours as needed Using seasonal)   . amLODIPine (NORVASC) 5 mg Oral Tablet Take 1 Tablet (5 mg total) by mouth Once a day   . Aspirin 81 mg Oral Tablet take 81 mg by mouth Once a day.   . cyanocobalamin, vitamin B-12, (VITAMIN B-12 ORAL) Take by mouth Once a day   . cyclobenzaprine (FLEXERIL) 10 mg Oral Tablet Take 0.5 Tablets (5 mg total) by mouth Once a day   . flash glucose sensor (FREESTYLE LIBRE 2 SENSOR) Does not apply Kit Every 14 days   . furosemide (LASIX) 40 mg Oral Tablet TAKE ONE TABLET BY MOUTH ONCE DAILY FOR 90 DAYS   . insulin glargine 100 unit/mL Subcutaneous Insulin Pen Inject 36 Units under the skin Every  night   . insulin lispro (HUMALOG KWIKPEN INSULIN) 100 unit/mL Subcutaneous Insulin Pen Three times a day with meals per sliding scale, up to 36 units a day4   . Metformin (GLUCOPHAGE) 1,000 mg Oral Tablet Take 1 Tablet (1,000 mg total) by mouth Twice daily   . multivitamin Oral Tablet Take 1 Tablet by mouth Once a day   . naproxen (NAPROSYN) 500 mg Oral Tablet Take 2 Tablets (1,000 mg total) by mouth Once a day   . potassium chloride (KLOR-CON) 10 mEq Oral Tablet Sustained Release TAKE ONE TABLET BY MOUTH ONCE DAILY WITH FOOD AND lasix   . semaglutide (RYBELSUS) 14 mg Oral Tablet Take 1 Tablet (14 mg total) by mouth Once a day   . sertraline (ZOLOFT) 100 mg Oral Tablet Take 1 Tablet (100 mg total) by mouth Once a day   . traZODone (DESYREL) 150 mg Oral Tablet take one-half TO one TABLET BY MOUTH EVERY NIGHT (needs TO see connie williams AND schedule appointment WITH dr Jerline Pain)   . UNIFINE PENTIPS 29  gauge x 1/2" Needle USE as directed for insulin administration (injecting insulin four times daily)       Past Medical History:  Past Medical History:   Diagnosis Date   . Ankle injury    . Cellulitis 2013   . CPAP (continuous positive airway pressure) dependence    . Diabetes    . Dizzy spells     bradycardia   . Esophageal reflux     takes tums sometimes   . HTN    . Hyperlipidemia    . Knee injury    . Morbid obesity (CMS [place])    . Nerve damage    . Sleep apnea     c pap; setting 3   . Type 2 diabetes mellitus (CMS HCC)     dx'd 5-6 yrs ago   . Wears glasses          Past Surgical History:   Procedure Laterality Date   . COLONOSCOPY  11/22/2017    Dr Addison Bailey   . HX CATARACT REMOVAL Right    . HX CATARACT REMOVAL Left 04/01/2019    Phacoemulsification of cataract left eye with IOL placement (04/01/2019).   Marland Kitchen HX HERNIA REPAIR      Bilateral   . HX HERNIA REPAIR     . HX HERNIA REPAIR      inguinal and umbilical repair    . HX ORCHIOPEXY     . HX OTHER      at age 50 to veins twisted in groin   . HX TONSILLECTOMY     . HX WISDOM TEETH EXTRACTION     . KNEE ARTHROSCOPY Bilateral            Social History:    Social History     Socioeconomic History   . Marital status: Married     Spouse name: CAROLYN   . Number of children: 1   Occupational History     Employer: [employer]   Tobacco Use   . Smoking status: Never   . Smokeless tobacco: Never   Vaping Use   . Vaping Use: Never used   Substance and Sexual Activity   . Alcohol use: No   . Drug use: No   Other Topics Concern   . Right hand dominant Yes   . Drives Yes   . Seat Belt Yes   . Ability to Walk 1 Flight of Steps without SOB/CP  Yes   . Routine Exercise No   . Ability to Walk 2 Flight of Steps without SOB/CP Yes   . Ability To Do Own ADL's Yes   . Other Activity Level Yes   Social History Narrative    ** Merged History Encounter **            Family History:  Family Medical History:     Problem Relation (Age of Onset)    Cancer Mother    Congestive  Heart Failure Maternal Grandmother    Diabetes Paternal Uncle            Review of Systems:  Constitutional: negative for fevers, chills, sweats, rash, weight loss, fatigue  HEENT: negative for headache, decreased vision, blurred vision.  Neck: negative for mass, pain, swelling  Respiratory: negative for cough, wheezing, dyspnea on exertion  Cardiovascular: negative for chest pain, chest pressure/discomfort,  palpitations, orthopnea, paroxysmal nocturnal dyspnea and lower extremity edema  Gastrointestinal: negative for dysphagia, reflux symptoms, nausea, vomiting, melena, diarrhea, constipation, abdominal pain  Genitourinary: negative for frequency, dysuria, nocturia, urinary incontinence, hesitancy, hematuria  Hematologic/lymphatic: negative for easy bruising or bleeding, anemia  Musculoskeletal: negative for muscle weakness, myalgias, arthralgias, back pain   Neurological: negative for headaches, dizziness, seizures, speech problems, head injury and weakness, burning, numbness   Psychiatric: negative for depression, anxiety  Endocrine: negative for polyuria, polydipsia, heat or cold intolerance    Objective     Vital Signs:  BP (!) 140/78   Pulse 90   Resp 18   Ht 1.727 m (5' 8")   Wt (!) 140 kg (309 lb)   SpO2 95%   BMI 46.98 kg/m         Examination:  General:  No acute distress  HEENT:  Eyes clear.    Neck:  Supple.  Cardiac:  Regular rate and rhythm, no gallops, murmurs or rubs.  Pulmonary:  Clear to ausculation bilaterally.  Pulmonary effort normal.  Gastrointestinal:  Nontender, nondistended, bowel sounds positive.    Musculoskeletal:  No deformity or lesions.  Skin:  Warm and of normal moisture.  Neurologic:  Alert and oriented.  Nonfocal.  No tremor.    Psychiatric:  Affect normal.  Diabetic foot exam:            Diagnostic Review:      Lab Results   Component Value Date    HA1C 12.5 (H) 10/02/2018     Lab Results   Component Value Date    TSH 1.494 10/02/2018     TSH (uIU/mL)   Date Value    10/02/2018 1.494       Lilli Light FNP-BC

## 2022-02-01 NOTE — Nursing Note (Signed)
02/01/22 1519   Diabetes Care   Date of Last Hemoglobin A1c 02/01/22   Hemoglobin A1c ( external) 9.4   Height and Weight   Height 1.727 m (5\' 8" )   Weight (!) 140 kg (309 lb)   % weight change -2.5 %   BSA (Calculated) 2.59   BMI (Calculated) 47.08   Ideal Body Weight (RT Use - Calculated) 68.4 kg     , RN

## 2022-02-03 ENCOUNTER — Telehealth (INDEPENDENT_AMBULATORY_CARE_PROVIDER_SITE_OTHER): Payer: Self-pay | Admitting: Family

## 2022-02-03 NOTE — Telephone Encounter (Signed)
Rybelsus 14 mg is available w/o prior authorization:    Available without authorization per insurance  Spoke with pharmacy to notify    Danne Baxter, RN

## 2022-02-24 ENCOUNTER — Encounter (INDEPENDENT_AMBULATORY_CARE_PROVIDER_SITE_OTHER): Payer: Self-pay | Admitting: Family

## 2022-02-27 ENCOUNTER — Other Ambulatory Visit (INDEPENDENT_AMBULATORY_CARE_PROVIDER_SITE_OTHER): Payer: Self-pay | Admitting: Family

## 2022-02-27 ENCOUNTER — Encounter (INDEPENDENT_AMBULATORY_CARE_PROVIDER_SITE_OTHER): Payer: Self-pay | Admitting: Family

## 2022-02-27 DIAGNOSIS — E1165 Type 2 diabetes mellitus with hyperglycemia: Secondary | ICD-10-CM

## 2022-02-27 MED ORDER — LEVEMIR FLEXPEN 100 UNIT/ML (3 ML) SOLUTION SUBCUTANEOUS INSULIN PEN
50.0000 [IU] | PEN_INJECTOR | Freq: Every day | SUBCUTANEOUS | 1 refills | Status: DC
Start: 2022-02-27 — End: 2022-07-27

## 2022-02-27 MED ORDER — INSULIN ASPART (U-100) 100 UNIT/ML (3 ML) SUBCUTANEOUS PEN
PEN_INJECTOR | SUBCUTANEOUS | 1 refills | Status: DC
Start: 2022-02-27 — End: 2022-05-04

## 2022-02-27 MED ORDER — PEN NEEDLE, DIABETIC 32 GAUGE X 5/32"
1 refills | Status: DC
Start: 2022-02-27 — End: 2023-06-25

## 2022-03-02 ENCOUNTER — Other Ambulatory Visit (INDEPENDENT_AMBULATORY_CARE_PROVIDER_SITE_OTHER): Payer: Self-pay | Admitting: Family

## 2022-03-02 DIAGNOSIS — E1165 Type 2 diabetes mellitus with hyperglycemia: Secondary | ICD-10-CM

## 2022-03-10 ENCOUNTER — Encounter (INDEPENDENT_AMBULATORY_CARE_PROVIDER_SITE_OTHER): Payer: Self-pay | Admitting: Family

## 2022-03-10 ENCOUNTER — Encounter (INDEPENDENT_AMBULATORY_CARE_PROVIDER_SITE_OTHER): Payer: Self-pay

## 2022-05-04 ENCOUNTER — Ambulatory Visit (INDEPENDENT_AMBULATORY_CARE_PROVIDER_SITE_OTHER): Payer: Commercial Managed Care - PPO | Admitting: Family

## 2022-05-04 ENCOUNTER — Other Ambulatory Visit: Payer: Self-pay

## 2022-05-04 ENCOUNTER — Encounter (INDEPENDENT_AMBULATORY_CARE_PROVIDER_SITE_OTHER): Payer: Self-pay | Admitting: Family

## 2022-05-04 VITALS — BP 148/88 | HR 94 | Resp 18 | Ht 68.0 in | Wt 302.5 lb

## 2022-05-04 DIAGNOSIS — E1165 Type 2 diabetes mellitus with hyperglycemia: Secondary | ICD-10-CM

## 2022-05-04 DIAGNOSIS — Z7984 Long term (current) use of oral hypoglycemic drugs: Secondary | ICD-10-CM

## 2022-05-04 DIAGNOSIS — I10 Essential (primary) hypertension: Secondary | ICD-10-CM

## 2022-05-04 DIAGNOSIS — Z794 Long term (current) use of insulin: Secondary | ICD-10-CM

## 2022-05-04 DIAGNOSIS — E785 Hyperlipidemia, unspecified: Secondary | ICD-10-CM

## 2022-05-04 LAB — POCT HGB A1C: POCT HGB A1C: 10.1 % — AB (ref 4–6)

## 2022-05-04 MED ORDER — DEXCOM G7 RECEIVER
0 refills | Status: DC
Start: 2022-05-04 — End: 2024-01-09

## 2022-05-04 MED ORDER — DEXCOM G7 SENSOR DEVICE
3 refills | Status: DC
Start: 2022-05-04 — End: 2023-02-21

## 2022-05-04 NOTE — Progress Notes (Signed)
[facility] Diabetes Progress Note    Date: 05/04/2022  Name:  [name]   Age: 61 y.o.  Attending: Lilli Light, FNP-BC  PCP: Sherene Sires, NP    Assessment/Recommendations:     Uncontrolled type 2 diabetes mellitus with hyperglycemia (CMS [facility]):  Not able to afford Mounjaro.  Not able to afford libre 3 or libre 2 with DME, check Dexcom G7  Continue Metformin.  Continue Rybelsus  14 mg , not affordable, sample of 3 mg dose provided while pt working on [place].  Continue Basaglar 60 units daily  Continue Humalog  10 plus 2/50 > 150 AC,  2/50 > 150 HS.    Essential hypertension: norvasc. Trend MA.     Hyperlipidemia, unspecified hyperlipidemia type: Crestor. Trend level.       Return in about 3 months (around 08/03/2022) for In Person Visit.    Chief Complaint:    Chief Complaint   Patient presents with    Diabetes Follow up    Hypertension    Hyperlipidemia     History of Present Illness:  Reports off diet for a while when had depression.  Onset: in his early 97s  CDE: none  Complications: mild neuropathy, ? DR   A1C: 10.1% 05/04/2022 <--9.4% 02/01/2022 <--14.4% 10/06/2021 <-- 13% 11/2020   Rx: Metformin 1000 mg bid, Rybelsus 14 mg daily, Basaglar 60 units qhs, Humalog SS , tried on Bydureon, tolerating Farxiga,  Ozempic in past  FSBS: 100-300s  Diet: less CHO  Exercise: little due to knee  Hypoglycemia: none  Statin: Crestor  Eye exam:  12/2021  Urine microalbumin/creatinine  Foot exam:  Diabetes Monitors  A1C - Glucose - Lipids Microalbumin   Results in Last 18 Months   Lab Test 02/01/22  0000 05/04/22  0000   POCTHA1C 9.4* 10.1*    No results for input(s): MICALBRNUR, MICALBCRERAT in the last 13140 hours.     Retinal Exam Date: 01/04/2022  Last Foot Exam: Not Found      Allergies:  Allergies   Allergen Reactions    Acetaminophen      Other reaction(s): Mild hives    Oxycodone (Bulk)      Other reaction(s): Mild hives    Tylox [Oxycodone-Acetaminophen] Hives/ Urticaria        Medications:    Outpatient Medications Marked as Taking for the 05/04/22 encounter (Office Visit) with Lilli Light, FNP-BC   Medication Sig    albuterol sulfate (PROAIR HFA) 90 mcg/actuation Inhalation HFA Aerosol Inhaler Take 1-2 Puffs by inhalation Every 6 hours as needed (Patient taking differently: Take 1-2 Puffs by inhalation Every 6 hours as needed Using seasonal)    amLODIPine (NORVASC) 5 mg Oral Tablet Take 1 Tablet (5 mg total) by mouth Once a day    Aspirin 81 mg Oral Tablet take 81 mg by mouth Once a day.    cyanocobalamin, vitamin B-12, (VITAMIN B-12 ORAL) Take by mouth Once a day    cyclobenzaprine (FLEXERIL) 10 mg Oral Tablet Take 0.5 Tablets (5 mg total) by mouth Once a day    flash glucose sensor (FREESTYLE LIBRE 2 SENSOR) Does not apply Kit Every 14 days    furosemide (LASIX) 40 mg Oral Tablet TAKE ONE TABLET BY MOUTH ONCE DAILY FOR 90 DAYS    insulin detemir U-100 (LEVEMIR FLEXPEN) 100 unit/mL (3 mL) Subcutaneous Insulin Pen Inject 50 Units under the skin Once a day (Patient taking differently: Inject 60 Units under the skin Once a  day)    insulin lispro (HUMALOG KWIKPEN INSULIN) 100 unit/mL Subcutaneous Insulin Pen Inject under the skin Twice a day before meals    Metformin (GLUCOPHAGE) 1,000 mg Oral Tablet Take 1 Tablet (1,000 mg total) by mouth Twice daily    multivitamin Oral Tablet Take 1 Tablet by mouth Once a day    naproxen (NAPROSYN) 500 mg Oral Tablet Take 2 Tablets (1,000 mg total) by mouth Once a day    Pen Needle, Disposable, (BD UF NANO PEN NEEDLE) 32 gauge x 5/32" Needle Use to inject insulin as directed up to 5 times daily    potassium chloride (KLOR-CON) 10 mEq Oral Tablet Sustained Release TAKE ONE TABLET BY MOUTH ONCE DAILY WITH FOOD AND lasix    RYBELSUS 14 mg Oral Tablet TAKE ONE TABLET (14 MG TOTAL) BY MOUTH ONCE DAILY    sertraline (ZOLOFT) 100 mg Oral Tablet Take 1 Tablet (100 mg total) by mouth Once a day    traZODone (DESYREL) 150 mg Oral Tablet take one-half TO one  TABLET BY MOUTH EVERY NIGHT (needs TO see connie williams AND schedule appointment WITH dr Jerline Pain)       Past Medical History:  Past Medical History:   Diagnosis Date    Ankle injury     Cellulitis 2013    CPAP (continuous positive airway pressure) dependence     Diabetes     Dizzy spells     bradycardia    Esophageal reflux     takes tums sometimes    HTN     Hyperlipidemia     Knee injury     Morbid obesity (CMS HCC)     Nerve damage     Sleep apnea     c pap; setting 3    Type 2 diabetes mellitus (CMS HCC)     dx'd 5-6 yrs ago    Wears glasses          Past Surgical History:   Procedure Laterality Date    COLONOSCOPY  11/22/2017    Dr Jacqulyn Liner CATARACT REMOVAL Right     HX CATARACT REMOVAL Left 04/01/2019    Phacoemulsification of cataract left eye with IOL placement (04/01/2019).    HX HERNIA REPAIR      Bilateral    HX HERNIA REPAIR      HX HERNIA REPAIR      inguinal and umbilical repair     HX ORCHIOPEXY      HX OTHER      at age 66 to veins twisted in groin    HX TONSILLECTOMY      HX WISDOM TEETH EXTRACTION      KNEE ARTHROSCOPY Bilateral            Social History:    Social History     Socioeconomic History    Marital status: Married     Spouse name: CAROLYN    Number of children: 1   Occupational History     Employer: [employer]   Tobacco Use    Smoking status: Never    Smokeless tobacco: Never   Vaping Use    Vaping Use: Never used   Substance and Sexual Activity    Alcohol use: No    Drug use: No   Other Topics Concern    Right hand dominant Yes    Drives Yes    Seat Belt Yes    Ability to Walk 1 Flight of Steps without SOB/CP Yes  Routine Exercise No    Ability to Walk 2 Flight of Steps without SOB/CP Yes    Ability To Do Own ADL's Yes    Other Activity Level Yes   Social History Narrative    ** Merged History Encounter **            Family History:  Family Medical History:       Problem Relation (Age of Onset)    Cancer Mother    Congestive Heart Failure Maternal Grandmother     Diabetes Paternal Uncle              Review of Systems:  Constitutional: negative for fevers, chills, sweats, rash,  fatigue, positive for weight loss,  HEENT: negative for headache, decreased vision, blurred vision.  Neck: negative for mass, pain, swelling  Respiratory: negative for cough, wheezing, dyspnea on exertion  Cardiovascular: negative for chest pain, chest pressure/discomfort,  palpitations, orthopnea, paroxysmal nocturnal dyspnea and lower extremity edema  Gastrointestinal: negative for dysphagia, reflux symptoms, nausea, vomiting, melena, diarrhea, constipation, abdominal pain  Genitourinary: negative for frequency, dysuria, nocturia, urinary incontinence, hesitancy, hematuria  Hematologic/lymphatic: negative for easy bruising or bleeding, anemia  Musculoskeletal: negative for muscle weakness, myalgias, arthralgias, back pain   Neurological: negative for headaches, dizziness, seizures, speech problems, head injury and weakness, burning, numbness   Psychiatric: negative for depression, anxiety  Endocrine: negative for polyuria, polydipsia, heat or cold intolerance    Objective     Vital Signs:  BP (!) 148/88   Pulse 94   Resp 18   Ht 1.727 m ('5\' 8"'$ )   Wt (!) 137 kg (302 lb 8 oz)   SpO2 97%   BMI 45.99 kg/m         Examination:  General:  No acute distress  HEENT:  Eyes clear.    Neck:  Supple.  Cardiac:  Regular rate and rhythm, no gallops, murmurs or rubs.  Pulmonary:  Clear to ausculation bilaterally.  Pulmonary effort normal.  Gastrointestinal:  Nontender, nondistended, bowel sounds positive.    Musculoskeletal:  No deformity or lesions.  Skin:  Warm and of normal moisture.  Neurologic:  Alert and oriented.  Nonfocal.  No tremor.    Psychiatric:  Affect normal.  Diabetic foot exam:            Diagnostic Review:      Lab Results   Component Value Date    HA1C 12.5 (H) 10/02/2018     Lab Results   Component Value Date    TSH 1.494 10/02/2018     TSH (uIU/mL)   Date Value   10/02/2018 1.494        Lilli Light FNP-BC

## 2022-05-04 NOTE — Nursing Note (Signed)
05/04/22 1452   Diabetes Care   Date of Last Hemoglobin A1c 05/04/22   Hemoglobin A1c ( external) 10.1   Height and Weight   Height 1.727 m (5\' 8" )   Weight (!) 137 kg (302 lb 8 oz)   % weight change -2.1 %   BSA (Calculated) 2.57   BMI (Calculated) 46.09   Ideal Body Weight (RT Use - Calculated) 68.4 kg     , RN

## 2022-05-24 ENCOUNTER — Encounter (INDEPENDENT_AMBULATORY_CARE_PROVIDER_SITE_OTHER): Payer: Self-pay | Admitting: Family

## 2022-06-27 ENCOUNTER — Encounter (HOSPITAL_COMMUNITY): Payer: Self-pay

## 2022-07-27 ENCOUNTER — Other Ambulatory Visit (INDEPENDENT_AMBULATORY_CARE_PROVIDER_SITE_OTHER): Payer: Self-pay | Admitting: Family

## 2022-07-27 DIAGNOSIS — E1165 Type 2 diabetes mellitus with hyperglycemia: Secondary | ICD-10-CM

## 2022-07-27 MED ORDER — INSULIN DEGLUDEC (U-100) 100 UNIT/ML (3 ML) SUBCUTANEOUS PEN
50.0000 [IU] | PEN_INJECTOR | Freq: Every day | SUBCUTANEOUS | 0 refills | Status: DC
Start: 2022-07-27 — End: 2023-05-10

## 2022-07-27 NOTE — Telephone Encounter (Signed)
Pharmacy called and left a vm to let us know that next year his Levemir is discontinued and his insurance will cover Lantus, Toujeo, or [place]. Pt was last seen in 04/2022 no follow up scheduled as of yet.  Rockwell Germany, RN       Lilli Light, FNP-BC  You14 minutes ago (12:42 PM)       Last note pt taking Nancee Liter, he can use Tresiba 50 units to replace that.

## 2023-02-21 ENCOUNTER — Ambulatory Visit (INDEPENDENT_AMBULATORY_CARE_PROVIDER_SITE_OTHER): Payer: Commercial Managed Care - PPO | Admitting: Family

## 2023-02-21 ENCOUNTER — Other Ambulatory Visit: Payer: Self-pay

## 2023-02-21 ENCOUNTER — Encounter (INDEPENDENT_AMBULATORY_CARE_PROVIDER_SITE_OTHER): Payer: Self-pay | Admitting: Family

## 2023-02-21 VITALS — BP 140/78 | HR 87 | Resp 18 | Ht 68.0 in | Wt 307.4 lb

## 2023-02-21 DIAGNOSIS — E1165 Type 2 diabetes mellitus with hyperglycemia: Secondary | ICD-10-CM

## 2023-02-21 DIAGNOSIS — E785 Hyperlipidemia, unspecified: Secondary | ICD-10-CM

## 2023-02-21 DIAGNOSIS — I10 Essential (primary) hypertension: Secondary | ICD-10-CM

## 2023-02-21 LAB — POCT HGB A1C: POCT HGB A1C: 12.4 % — AB (ref 4–6)

## 2023-02-21 NOTE — Progress Notes (Signed)
[facility]  [facility] Diabetes Progress Note    Date: 02/21/2023  Name:  [name]   Age: 62 y.o.  Attending: Nita Sickle, FNP-BC  PCP: Myrtis Ser, NP    Assessment/Recommendations:     Uncontrolled type 2 diabetes mellitus with hyperglycemia (CMS [facility]):  Not able to afford Mounjaro.  Not able to afford libre or Dexcom G7 with DME.  Continue Metformin.  Try Ozempic to replace Rybelsus, titrate up to 1 mg weekly by sample, will do Novo PA.   Continue Tresiba 70 units daily  Continue Humalog  10 plus 2/50 > 150 AC,  2/50 > 150 HS. Tolerating NovoLog in past, will do PA.    Essential hypertension: Norvasc 10 mg daily now.      Hyperlipidemia, unspecified hyperlipidemia type: Crestor.     Request labs from PCP.     Return in about 3 months (around 05/24/2023).    Chief Complaint:    Chief Complaint   Patient presents with    Diabetes Follow up    Hypertension    Hyperlipidemia     History of Present Illness:  No f/u since 04/2022, forgot to make appointment after last visit.  Onset: in his early 66s  CDE: none  Complications: mild neuropathy, ? DR   A1C: 12.4% 02/21/2023<--10.1% 05/04/2022 <--9.4% 02/01/2022 <--14.4% 10/06/2021 <-- 13% 11/2020   Rx: Metformin 1000 mg bid, Rybelsus 14 mg daily (out of supply for 1 month), Tresiba 70 units qhs, Humalog SS , tried on Bydureon, tolerating Farxiga,  Ozempic in past  FSBS: 100-300s  Diet: less CHO  Exercise: little due to knee  Hypoglycemia: none  Statin: Crestor  Eye exam:  12/2021  Urine microalbumin/creatinine  Foot exam:  Diabetes Monitors  A1C - Glucose - Lipids Microalbumin   Results in Last 18 Months   Lab Test 02/01/22  0000 05/04/22  0000 02/21/23  0000   POCTHA1C 9.4* 10.1* 12.4*    No results for input(s): "MICALBRNUR", "MICALBCRERAT" in the last 16109 hours.     Retinal Exam Date: 01/04/2022  Last Foot Exam: Not Found      Allergies:  Allergies   Allergen Reactions    Acetaminophen      Other reaction(s): Mild hives    Oxycodone (Bulk)       Other reaction(s): Mild hives    Tylox [Oxycodone-Acetaminophen] Hives/ Urticaria       Medications:    Outpatient Medications Marked as Taking for the 02/21/23 encounter (Office Visit) with Nita Sickle, FNP-BC   Medication Sig    albuterol sulfate (PROAIR HFA) 90 mcg/actuation Inhalation HFA Aerosol Inhaler Take 1-2 Puffs by inhalation Every 6 hours as needed (Patient taking differently: Take 1-2 Puffs by inhalation Every 6 hours as needed Using seasonal)    amLODIPine (NORVASC) 5 mg Oral Tablet Take 1 Tablet (5 mg total) by mouth Once a day    Aspirin 81 mg Oral Tablet take 81 mg by mouth Once a day.    Blood-Glucose Meter,Continuous (DEXCOM G7 RECEIVER) Does not apply Misc As instructed    cyanocobalamin, vitamin B-12, (VITAMIN B-12 ORAL) Take by mouth Once a day    cyclobenzaprine (FLEXERIL) 10 mg Oral Tablet Take 0.5 Tablets (5 mg total) by mouth Once a day    furosemide (LASIX) 40 mg Oral Tablet TAKE ONE TABLET BY MOUTH ONCE DAILY FOR 90 DAYS    insulin degludec 100 unit/mL (3 mL) Subcutaneous Insulin Pen Inject 50 Units under the skin Once a  day (Patient taking differently: Inject 70 Units under the skin Once a day)    insulin lispro (HUMALOG KWIKPEN INSULIN) 100 unit/mL Subcutaneous Insulin Pen Inject under the skin Twice a day before meals    Metformin (GLUCOPHAGE) 1,000 mg Oral Tablet Take 1 Tablet (1,000 mg total) by mouth Twice daily    multivitamin Oral Tablet Take 1 Tablet by mouth Once a day    naproxen (NAPROSYN) 500 mg Oral Tablet Take 2 Tablets (1,000 mg total) by mouth Once a day    Pen Needle, Disposable, (BD UF NANO PEN NEEDLE) 32 gauge x 5/32" Needle Use to inject insulin as directed up to 5 times daily    potassium chloride (KLOR-CON) 10 mEq Oral Tablet Sustained Release TAKE ONE TABLET BY MOUTH ONCE DAILY WITH FOOD AND lasix    sertraline (ZOLOFT) 100 mg Oral Tablet Take 1 Tablet (100 mg total) by mouth Once a day    traZODone (DESYREL) 150 mg Oral Tablet take one-half TO one TABLET BY MOUTH  EVERY NIGHT (needs TO see connie williams AND schedule appointment WITH dr Jimmey Ralph)       Past Medical History:  Past Medical History:   Diagnosis Date    Ankle injury     Cellulitis 2013    CPAP (continuous positive airway pressure) dependence     Diabetes     Dizzy spells     bradycardia    Esophageal reflux     takes tums sometimes    HTN     Hyperlipidemia     Knee injury     Morbid obesity (CMS HCC)     Nerve damage     Sleep apnea     c pap; setting 3    Type 2 diabetes mellitus (CMS HCC)     dx'd 5-6 yrs ago    Wears glasses          Past Surgical History:   Procedure Laterality Date    COLONOSCOPY  11/22/2017    Dr Bryson Dames CATARACT REMOVAL Right     HX CATARACT REMOVAL Left 04/01/2019    Phacoemulsification of cataract left eye with IOL placement (04/01/2019).    HX HERNIA REPAIR      Bilateral    HX HERNIA REPAIR      HX HERNIA REPAIR      inguinal and umbilical repair     HX ORCHIOPEXY      HX OTHER      at age 64 to veins twisted in groin    HX TONSILLECTOMY      HX WISDOM TEETH EXTRACTION      KNEE ARTHROSCOPY Bilateral            Social History:    Social History     Socioeconomic History    Marital status: Married     Spouse name: CAROLYN    Number of children: 1   Occupational History     Employer: [employer]   Tobacco Use    Smoking status: Never    Smokeless tobacco: Never   Vaping Use    Vaping status: Never Used   Substance and Sexual Activity    Alcohol use: No    Drug use: No   Other Topics Concern    Right hand dominant Yes    Drives Yes    Seat Belt Yes    Ability to Walk 1 Flight of Steps without SOB/CP Yes    Routine Exercise No  Ability to Walk 2 Flight of Steps without SOB/CP Yes    Ability To Do Own ADL's Yes    Other Activity Level Yes   Social History Narrative    ** Merged History Encounter **            Family History:  Family Medical History:       Problem Relation (Age of Onset)    Cancer Mother    Congestive Heart Failure Maternal Grandmother    Diabetes Paternal  Uncle              Review of Systems:  Constitutional: negative for fevers, chills, sweats, rash,  fatigue, positive for weight gain  HEENT: negative for headache, decreased vision, blurred vision.  Neck: negative for mass, pain, swelling  Respiratory: negative for cough, wheezing, dyspnea on exertion  Cardiovascular: negative for chest pain, chest pressure/discomfort,  palpitations, orthopnea, paroxysmal nocturnal dyspnea and lower extremity edema  Gastrointestinal: negative for dysphagia, reflux symptoms, nausea, vomiting, melena, diarrhea, constipation, abdominal pain  Genitourinary: negative for frequency, dysuria, nocturia, urinary incontinence, hesitancy, hematuria  Hematologic/lymphatic: negative for easy bruising or bleeding, anemia  Musculoskeletal: negative for muscle weakness, myalgias, arthralgias, back pain   Neurological: negative for headaches, dizziness, seizures, speech problems, head injury and weakness, burning, numbness   Psychiatric: negative for depression, anxiety  Endocrine: negative for polyuria, polydipsia, heat or cold intolerance    Objective     Vital Signs:  BP (!) 140/78   Pulse 87   Resp 18   Ht 1.727 m (5\' 8" )   Wt (!) 139 kg (307 lb 6.4 oz)   SpO2 97%   BMI 46.74 kg/m         Examination:  General:  No acute distress  HEENT:  Eyes clear.    Neck:  Supple.  Cardiac:  Regular rate and rhythm, no gallops, murmurs or rubs.  Pulmonary:  Clear to ausculation bilaterally.  Pulmonary effort normal.  Gastrointestinal:  Nontender, nondistended, bowel sounds positive.    Musculoskeletal:  No deformity or lesions.  Skin:  Warm and of normal moisture.  Neurologic:  Alert and oriented.  Nonfocal.  No tremor.    Psychiatric:  Affect normal.  Diabetic foot exam:            Diagnostic Review:      Lab Results   Component Value Date    HA1C 12.5 (H) 10/02/2018     Lab Results   Component Value Date    TSH 1.494 10/02/2018     TSH (uIU/mL)   Date Value   10/02/2018 1.494       Nita Sickle  FNP-BC

## 2023-02-21 NOTE — Nursing Note (Signed)
Can you call pt's PCP to get most recent labs faxed to our office? Thank you  Danne Baxter, RN

## 2023-02-21 NOTE — Nursing Note (Signed)
02/21/23 1533   Diabetes Care   Date of Last Hemoglobin A1c 02/21/23   Hemoglobin A1c ( external) 12.4   Height and Weight   Height 1.727 m (5\' 8" )   Weight (!) 139 kg (307 lb 6.4 oz)   % weight change 1.6 %   BSA (Calculated) 2.59   BMI (Calculated) 46.84   Ideal Body Weight (RT Use - Calculated) 68.4 kg     Danne Baxter, RN

## 2023-02-23 ENCOUNTER — Encounter (INDEPENDENT_AMBULATORY_CARE_PROVIDER_SITE_OTHER): Payer: Self-pay | Admitting: Family

## 2023-02-23 NOTE — Nursing Note (Signed)
Called Myrtis Ser, NP's office to fax most recent labwork for patient. Margo Aye, MA

## 2023-05-10 ENCOUNTER — Other Ambulatory Visit (INDEPENDENT_AMBULATORY_CARE_PROVIDER_SITE_OTHER): Payer: Self-pay | Admitting: Family

## 2023-05-10 DIAGNOSIS — E1165 Type 2 diabetes mellitus with hyperglycemia: Secondary | ICD-10-CM

## 2023-05-28 ENCOUNTER — Encounter (INDEPENDENT_AMBULATORY_CARE_PROVIDER_SITE_OTHER): Payer: Self-pay | Admitting: Family

## 2023-06-25 ENCOUNTER — Other Ambulatory Visit (INDEPENDENT_AMBULATORY_CARE_PROVIDER_SITE_OTHER): Payer: Self-pay | Admitting: Family

## 2023-06-25 DIAGNOSIS — E1165 Type 2 diabetes mellitus with hyperglycemia: Secondary | ICD-10-CM

## 2023-09-05 ENCOUNTER — Other Ambulatory Visit (HOSPITAL_COMMUNITY): Payer: Self-pay | Admitting: PHYSICIAN ASSISTANT

## 2023-09-05 DIAGNOSIS — S37011A Minor contusion of right kidney, initial encounter: Secondary | ICD-10-CM

## 2023-09-12 ENCOUNTER — Encounter (INDEPENDENT_AMBULATORY_CARE_PROVIDER_SITE_OTHER): Payer: No Typology Code available for payment source | Admitting: Family

## 2023-09-17 ENCOUNTER — Ambulatory Visit
Admission: RE | Admit: 2023-09-17 | Discharge: 2023-09-17 | Disposition: A | Discharge status: Home / Self Care | Payer: No Typology Code available for payment source | Source: Ambulatory Visit | Attending: PHYSICIAN ASSISTANT | Admitting: PHYSICIAN ASSISTANT

## 2023-09-17 ENCOUNTER — Other Ambulatory Visit: Payer: Self-pay

## 2023-09-17 DIAGNOSIS — S37011A Minor contusion of right kidney, initial encounter: Secondary | ICD-10-CM | POA: Insufficient documentation

## 2023-09-17 LAB — POC ISTAT CREATININE (RESULT): CREATININE, POC: 1 mg/dL (ref 0.62–1.27)

## 2023-09-17 MED ORDER — IOPAMIDOL 300 MG IODINE/ML (61 %) INTRAVENOUS SOLUTION
100.0000 mL | INTRAVENOUS | Status: AC
Start: 2023-09-17 — End: 2023-09-17
  Administered 2023-09-17: 90 mL via INTRAVENOUS
  Filled 2023-09-17: qty 100

## 2023-10-04 ENCOUNTER — Other Ambulatory Visit (HOSPITAL_COMMUNITY): Payer: Self-pay | Admitting: PHYSICIAN ASSISTANT

## 2023-10-04 DIAGNOSIS — L02811 Cutaneous abscess of head [any part, except face]: Secondary | ICD-10-CM

## 2023-10-07 ENCOUNTER — Emergency Department (HOSPITAL_COMMUNITY): Payer: No Typology Code available for payment source

## 2023-10-07 ENCOUNTER — Observation Stay (HOSPITAL_COMMUNITY): Payer: No Typology Code available for payment source | Admitting: INTERNAL MEDICINE

## 2023-10-07 ENCOUNTER — Observation Stay
Admission: EM | Admit: 2023-10-07 | Discharge: 2023-10-09 | Disposition: A | Discharge status: Home / Self Care | Payer: No Typology Code available for payment source | Attending: Internal Medicine | Admitting: Internal Medicine

## 2023-10-07 ENCOUNTER — Encounter (HOSPITAL_COMMUNITY): Payer: Self-pay

## 2023-10-07 ENCOUNTER — Other Ambulatory Visit: Payer: Self-pay

## 2023-10-07 DIAGNOSIS — J45909 Unspecified asthma, uncomplicated: Secondary | ICD-10-CM | POA: Insufficient documentation

## 2023-10-07 DIAGNOSIS — G4733 Obstructive sleep apnea (adult) (pediatric): Secondary | ICD-10-CM | POA: Insufficient documentation

## 2023-10-07 DIAGNOSIS — Z6841 Body Mass Index (BMI) 40.0 and over, adult: Secondary | ICD-10-CM | POA: Insufficient documentation

## 2023-10-07 DIAGNOSIS — I872 Venous insufficiency (chronic) (peripheral): Secondary | ICD-10-CM | POA: Insufficient documentation

## 2023-10-07 DIAGNOSIS — L0390 Cellulitis, unspecified: Secondary | ICD-10-CM

## 2023-10-07 DIAGNOSIS — Z794 Long term (current) use of insulin: Secondary | ICD-10-CM | POA: Insufficient documentation

## 2023-10-07 DIAGNOSIS — I10 Essential (primary) hypertension: Secondary | ICD-10-CM | POA: Insufficient documentation

## 2023-10-07 DIAGNOSIS — E1165 Type 2 diabetes mellitus with hyperglycemia: Secondary | ICD-10-CM | POA: Insufficient documentation

## 2023-10-07 DIAGNOSIS — I890 Lymphedema, not elsewhere classified: Secondary | ICD-10-CM | POA: Insufficient documentation

## 2023-10-07 DIAGNOSIS — E119 Type 2 diabetes mellitus without complications: Secondary | ICD-10-CM | POA: Diagnosis present

## 2023-10-07 DIAGNOSIS — Z7984 Long term (current) use of oral hypoglycemic drugs: Secondary | ICD-10-CM | POA: Insufficient documentation

## 2023-10-07 DIAGNOSIS — L02811 Cutaneous abscess of head [any part, except face]: Secondary | ICD-10-CM | POA: Insufficient documentation

## 2023-10-07 DIAGNOSIS — Z91199 Patient's noncompliance with other medical treatment and regimen due to unspecified reason: Secondary | ICD-10-CM | POA: Insufficient documentation

## 2023-10-07 DIAGNOSIS — Z22322 Carrier or suspected carrier of Methicillin resistant Staphylococcus aureus: Secondary | ICD-10-CM | POA: Insufficient documentation

## 2023-10-07 DIAGNOSIS — Z9989 Dependence on other enabling machines and devices: Secondary | ICD-10-CM | POA: Diagnosis present

## 2023-10-07 DIAGNOSIS — L0211 Cutaneous abscess of neck: Principal | ICD-10-CM | POA: Insufficient documentation

## 2023-10-07 DIAGNOSIS — E785 Hyperlipidemia, unspecified: Secondary | ICD-10-CM | POA: Insufficient documentation

## 2023-10-07 LAB — CBC WITH DIFF
BASOPHIL #: <0.1 10*3/uL (ref <=0.20)
BASOPHIL %: 0.8 %
EOSINOPHIL #: 0.21 10*3/uL (ref <=0.50)
EOSINOPHIL %: 2.1 %
HCT: 41.4 % (ref 38.9–52.0)
HGB: 14.1 g/dL (ref 13.4–17.5)
IMMATURE GRANULOCYTE #: <0.1 10*3/uL (ref <0.10)
IMMATURE GRANULOCYTE %: 0.8 % (ref 0.0–1.0)
LYMPHOCYTE #: 1.9 10*3/uL (ref 1.00–4.80)
LYMPHOCYTE %: 19.4 %
MCH: 29.9 pg (ref 26.0–32.0)
MCHC: 34.1 g/dL (ref 31.0–35.5)
MCV: 87.9 fL (ref 78.0–100.0)
MONOCYTE #: 0.93 10*3/uL (ref 0.20–1.10)
MONOCYTE %: 9.5 %
MPV: 9.4 fL (ref 8.7–12.5)
NEUTROPHIL #: 6.58 10*3/uL (ref 1.50–7.70)
NEUTROPHIL %: 67.4 %
PLATELETS: 272 10*3/uL (ref 150–400)
RBC: 4.71 10*6/uL (ref 4.50–6.10)
RDW-CV: 12.3 % (ref 11.5–15.5)
WBC: 9.8 10*3/uL (ref 3.7–11.0)

## 2023-10-07 LAB — BASIC METABOLIC PANEL
ANION GAP: 16 mmol/L — ABNORMAL HIGH (ref 4–13)
BUN/CREA RATIO: 18 (ref 6–22)
BUN: 20 mg/dL (ref 8–25)
CALCIUM: 9.1 mg/dL (ref 8.6–10.3)
CHLORIDE: 90 mmol/L — ABNORMAL LOW (ref 96–111)
CO2 TOTAL: 24 mmol/L (ref 23–31)
CREATININE: 1.1 mg/dL (ref 0.75–1.35)
ESTIMATED GFR - MALE: 77 mL/min/BSA (ref >=60)
GLUCOSE: 536 mg/dL (ref 65–125)
POTASSIUM: 3.3 mmol/L — ABNORMAL LOW (ref 3.5–5.1)
SODIUM: 130 mmol/L — ABNORMAL LOW (ref 136–145)

## 2023-10-07 LAB — LIGHT GREEN TOP TUBE

## 2023-10-07 LAB — POC BLOOD GLUCOSE (RESULTS)
GLUCOSE, POC: 388 mg/dL — ABNORMAL HIGH (ref 60–110)
GLUCOSE, POC: 236 mg/dL — ABNORMAL HIGH (ref 60–110)
GLUCOSE, POC: 321 mg/dL — ABNORMAL HIGH (ref 60–110)
GLUCOSE, POC: 315 mg/dL — ABNORMAL HIGH (ref 60–110)

## 2023-10-07 LAB — BLUE TOP TUBE

## 2023-10-07 LAB — HGA1C (HEMOGLOBIN A1C WITH EST AVG GLUCOSE)
ESTIMATED AVERAGE GLUCOSE: 309 mg/dL
HEMOGLOBIN A1C: 12.4 % — ABNORMAL HIGH (ref 4.3–6.1)

## 2023-10-07 LAB — RED TOP TUBE

## 2023-10-07 LAB — C-REACTIVE PROTEIN(CRP),INFLAMMATION: CRP INFLAMMATION: 114.1 mg/L — ABNORMAL HIGH (ref <8.0)

## 2023-10-07 LAB — MRSA/MSSA COLONIZATION SCREEN, PCR
MRSA COLONIZATION SCREEN: NEGATIVE
STAPHYLOCOCCUS AUREUS: POSITIVE — AB

## 2023-10-07 LAB — SEDIMENTATION RATE: ERYTHROCYTE SEDIMENTATION RATE (ESR): 62 mm/h — ABNORMAL HIGH (ref 0–20)

## 2023-10-07 MED ORDER — POTASSIUM CHLORIDE 20 MEQ/L IN 0.9 % SODIUM CHLORIDE INTRAVENOUS
INTRAVENOUS | Status: DC
Start: 2023-10-07 — End: 2023-10-08
  Administered 2023-10-08: 0 mL/h via INTRAVENOUS
  Filled 2023-10-07 (×3): qty 1000

## 2023-10-07 MED ORDER — VANCOMYCIN 1,000 MG INTRAVENOUS INJECTION
INTRAVENOUS | Status: AC
Start: 2023-10-07 — End: 2023-10-08
  Filled 2023-10-07: qty 20

## 2023-10-07 MED ORDER — CEFTRIAXONE 2 GRAM/50 ML IN DEXTROSE (ISO-OSM) INTRAVENOUS PIGGYBACK
2.0000 g | INJECTION | INTRAVENOUS | Status: DC
Start: 2023-10-07 — End: 2023-10-09
  Administered 2023-10-08: 2 g via INTRAVENOUS
  Administered 2023-10-08: 0 g via INTRAVENOUS
  Filled 2023-10-07 (×3): qty 50

## 2023-10-07 MED ORDER — ONDANSETRON HCL (PF) 4 MG/2 ML INJECTION SOLUTION
4.0000 mg | Freq: Four times a day (QID) | INTRAMUSCULAR | Status: DC | PRN
Start: 2023-10-07 — End: 2023-10-09

## 2023-10-07 MED ORDER — SODIUM CHLORIDE 0.9 % (FLUSH) INJECTION SYRINGE
5.0000 mL | INJECTION | INTRAMUSCULAR | Status: DC | PRN
Start: 2023-10-07 — End: 2023-10-09

## 2023-10-07 MED ORDER — ENOXAPARIN 40 MG/0.4 ML SUB-Q SYRINGE - EAST
40.0000 mg | INJECTION | SUBCUTANEOUS | Status: DC
Start: 2023-10-07 — End: 2023-10-09
  Administered 2023-10-07: 40 mg via SUBCUTANEOUS
  Filled 2023-10-07: qty 0.4

## 2023-10-07 MED ORDER — VANCOMYCIN IV - PHARMACIST TO DOSE PER PROTOCOL
Freq: Every day | Status: DC | PRN
Start: 2023-10-07 — End: 2023-10-09

## 2023-10-07 MED ORDER — NYSTATIN 100,000 UNIT/GRAM TOPICAL POWDER
Freq: Two times a day (BID) | CUTANEOUS | Status: DC
Start: 2023-10-07 — End: 2023-10-17
  Administered 2023-10-07: 0 g via TOPICAL
  Filled 2023-10-07: qty 30

## 2023-10-07 MED ORDER — SODIUM CHLORIDE 0.9 % IV BOLUS
1000.0000 mL | INJECTION | Status: AC
Start: 2023-10-07 — End: 2023-10-07
  Administered 2023-10-07: 1000 mL via INTRAVENOUS
  Administered 2023-10-07: 0 mL via INTRAVENOUS

## 2023-10-07 MED ORDER — METFORMIN 500 MG TABLET
1000.0000 mg | ORAL_TABLET | Freq: Two times a day (BID) | ORAL | Status: DC
Start: 2023-10-07 — End: 2023-10-09

## 2023-10-07 MED ORDER — VANCOMYCIN 1,000 MG INTRAVENOUS INJECTION
15.0000 mg/kg | Freq: Two times a day (BID) | INTRAVENOUS | Status: DC
Start: 2023-10-07 — End: 2023-10-09
  Administered 2023-10-07: 1500 mg via INTRAVENOUS
  Administered 2023-10-07: 0 mg via INTRAVENOUS
  Administered 2023-10-08 (×2): 1500 mg via INTRAVENOUS
  Administered 2023-10-08 (×2): 0 mg via INTRAVENOUS
  Administered 2023-10-09 (×2): 1500 mg via INTRAVENOUS
  Administered 2023-10-09: 0 mg via INTRAVENOUS
  Filled 2023-10-07 (×6): qty 15

## 2023-10-07 MED ORDER — DEXTROSE 10 % IN WATER (D10W) BOLUS
250.0000 mL | INJECTION | INTRAVENOUS | Status: DC | PRN
Start: 2023-10-07 — End: 2023-10-09

## 2023-10-07 MED ORDER — IOPAMIDOL 300 MG IODINE/ML (61 %) INTRAVENOUS SOLUTION
100.0000 mL | INTRAVENOUS | Status: AC
Start: 2023-10-07 — End: 2023-10-07
  Administered 2023-10-07: 94 mL via INTRAVENOUS
  Filled 2023-10-07: qty 100

## 2023-10-07 MED ORDER — SODIUM CHLORIDE 0.9 % (FLUSH) INJECTION SYRINGE
5.0000 mL | INJECTION | Freq: Two times a day (BID) | INTRAMUSCULAR | Status: DC
Start: 2023-10-07 — End: 2023-10-09
  Administered 2023-10-07 (×2): 0 mL
  Administered 2023-10-08 – 2023-10-09 (×3): 5 mL

## 2023-10-07 MED ORDER — SODIUM CHLORIDE 0.9 % INTRAVENOUS SOLUTION
3.0000 g | Freq: Four times a day (QID) | INTRAVENOUS | Status: DC
Start: 2023-10-07 — End: 2023-10-07
  Administered 2023-10-07 (×2): 0 g via INTRAVENOUS
  Administered 2023-10-07: 3 g via INTRAVENOUS
  Filled 2023-10-07 (×5): qty 20

## 2023-10-07 MED ORDER — MULTIVITAMIN WITH FOLIC ACID 400 MCG TABLET
1.0000 | ORAL_TABLET | Freq: Every day | ORAL | Status: DC
Start: 2023-10-07 — End: 2023-10-09
  Administered 2023-10-07 – 2023-10-09 (×3): 1 via ORAL
  Filled 2023-10-07 (×3): qty 1

## 2023-10-07 MED ORDER — DEXTROSE 40 % ORAL GEL
15.0000 g | ORAL | Status: DC | PRN
Start: 2023-10-07 — End: 2023-10-09

## 2023-10-07 MED ORDER — CEFTRIAXONE 2 GRAM/50 ML IN DEXTROSE (ISO-OSM) INTRAVENOUS PIGGYBACK
INJECTION | INTRAVENOUS | Status: AC
Start: 2023-10-07 — End: 2023-10-07
  Administered 2023-10-07: 2 g via INTRAVENOUS
  Administered 2023-10-07: 0 g via INTRAVENOUS
  Filled 2023-10-07: qty 50

## 2023-10-07 MED ORDER — GLUCAGON 1 MG SOLUTION FOR INJECTION
1.0000 mg | Freq: Once | INTRAMUSCULAR | Status: DC | PRN
Start: 2023-10-07 — End: 2023-10-09

## 2023-10-07 MED ORDER — CYANOCOBALAMIN (VIT B-12) 1,000 MCG TABLET
1000.0000 ug | ORAL_TABLET | Freq: Every day | ORAL | Status: DC
Start: 2023-10-07 — End: 2023-10-09
  Administered 2023-10-07 – 2023-10-09 (×3): 1000 ug via ORAL
  Filled 2023-10-07 (×3): qty 1

## 2023-10-07 MED ORDER — AMLODIPINE 5 MG TABLET
5.0000 mg | ORAL_TABLET | Freq: Every day | ORAL | Status: DC
Start: 2023-10-07 — End: 2023-10-09
  Administered 2023-10-07 – 2023-10-09 (×3): 5 mg via ORAL
  Filled 2023-10-07 (×3): qty 1

## 2023-10-07 MED ORDER — INSULIN REGULAR HUMAN 100 UNIT/ML INJECTION - CHARGE BY DOSE
15.0000 [IU] | INTRAMUSCULAR | Status: AC
Start: 2023-10-07 — End: 2023-10-07
  Administered 2023-10-07: 15 [IU] via SUBCUTANEOUS
  Filled 2023-10-07: qty 15

## 2023-10-07 MED ORDER — SERTRALINE 50 MG TABLET
100.0000 mg | ORAL_TABLET | Freq: Every day | ORAL | Status: DC
Start: 2023-10-08 — End: 2023-10-09
  Administered 2023-10-08 – 2023-10-09 (×2): 100 mg via ORAL
  Filled 2023-10-07 (×2): qty 2

## 2023-10-07 MED ORDER — SULFAMETHOXAZOLE 800 MG-TRIMETHOPRIM 160 MG TABLET
2.0000 | ORAL_TABLET | Freq: Two times a day (BID) | ORAL | Status: DC
Start: 2023-10-07 — End: 2023-10-07

## 2023-10-07 MED ORDER — INSULIN LISPRO 100 UNIT/ML SUB-Q SSIP VIAL
1.0000 [IU] | INJECTION | Freq: Four times a day (QID) | SUBCUTANEOUS | Status: DC
Start: 2023-10-07 — End: 2023-10-09
  Administered 2023-10-07: 4 [IU] via SUBCUTANEOUS
  Administered 2023-10-07: 5 [IU] via SUBCUTANEOUS
  Administered 2023-10-07: 4 [IU] via SUBCUTANEOUS
  Administered 2023-10-07 – 2023-10-08 (×2): 2 [IU] via SUBCUTANEOUS
  Administered 2023-10-08: 5 [IU] via SUBCUTANEOUS
  Administered 2023-10-08: 4 [IU] via SUBCUTANEOUS
  Administered 2023-10-08 – 2023-10-09 (×3): 2 [IU] via SUBCUTANEOUS
  Filled 2023-10-07: qty 4
  Filled 2023-10-07 (×4): qty 2
  Filled 2023-10-07: qty 5
  Filled 2023-10-07: qty 4
  Filled 2023-10-07: qty 2
  Filled 2023-10-07: qty 5
  Filled 2023-10-07: qty 4

## 2023-10-07 MED ORDER — SODIUM CHLORIDE 0.9 % INTRAVENOUS SOLUTION
3.0000 g | INTRAVENOUS | Status: AC
Start: 2023-10-07 — End: 2023-10-07
  Administered 2023-10-07: 3 g via INTRAVENOUS
  Administered 2023-10-07: 0 g via INTRAVENOUS
  Filled 2023-10-07: qty 8

## 2023-10-07 MED ORDER — METOPROLOL TARTRATE 25 MG TABLET
25.0000 mg | ORAL_TABLET | Freq: Every day | ORAL | Status: DC
Start: 2023-10-07 — End: 2023-10-09
  Administered 2023-10-07 – 2023-10-09 (×3): 25 mg via ORAL
  Filled 2023-10-07 (×3): qty 1

## 2023-10-07 MED ORDER — ASPIRIN 81 MG CHEWABLE TABLET
81.0000 mg | CHEWABLE_TABLET | Freq: Every day | ORAL | Status: DC
Start: 2023-10-07 — End: 2023-10-09
  Administered 2023-10-07 – 2023-10-09 (×3): 81 mg via ORAL
  Filled 2023-10-07 (×3): qty 1

## 2023-10-07 MED ORDER — LACTOBACILLUS RHAMNOSUS GG 10 BILLION CELL CAPSULE
1.0000 | ORAL_CAPSULE | Freq: Two times a day (BID) | ORAL | Status: DC
Start: 2023-10-07 — End: 2023-10-09
  Administered 2023-10-07: 1 via ORAL
  Administered 2023-10-08: 0 via ORAL
  Administered 2023-10-08 – 2023-10-09 (×2): 1 via ORAL
  Filled 2023-10-07 (×3): qty 1

## 2023-10-07 MED ORDER — FUROSEMIDE 40 MG TABLET
40.0000 mg | ORAL_TABLET | Freq: Every day | ORAL | Status: DC
Start: 2023-10-07 — End: 2023-10-09

## 2023-10-07 NOTE — ED Provider Notes (Signed)
 [facility] Emergency Department    I personally saw patient by myself without resident involvement. This is the primary note for patient visit. Chart completed after conclusion of patient care due to time constraints of direct patient care during shift.  Chart was dictated using voice recognition software, which may lead to minor grammatical or syntax errors.     CC:  Draining mass  HPI:  [name] is a 63 y.o. male with no significant past medical history presents with draining mass.  Patient reports that earlier in the week he was seen by the [facility] clinic for sinus infection when they noted a large abscess in the back of his neck and referred him to General surgery and place him on cefdinir for both complaints.  However patient tonight noticed that he had fluid draining out of the mass on the back of his neck thought it may it might be cerebral spinal fluid since he could not see it so decided come in for evaluation.  He is not exactly sure how long the abscess has been there is he has not been able to see it and has not really felt it.  Thinks possibly at least a week.  Some chills no documented fevers.  No other systemic complaints      ROS:  All other systems reviewed and are negative.    Medications:  Medications Prior to Admission       Prescriptions    albuterol sulfate (PROAIR HFA) 90 mcg/actuation Inhalation HFA Aerosol Inhaler    Take 1-2 Puffs by inhalation Every 6 hours as needed    Patient taking differently:  Take 1-2 Puffs by inhalation Every 6 hours as needed Using seasonal    amLODIPine (NORVASC) 5 mg Oral Tablet    Take 1 Tablet (5 mg total) by mouth Once a day    Aspirin 81 mg Oral Tablet    take 81 mg by mouth Once a day.    Blood-Glucose Meter,Continuous (DEXCOM G7 RECEIVER) Does not apply Misc    As instructed    cyanocobalamin, vitamin B-12, (VITAMIN B-12 ORAL)    Take by mouth Once a day    cyclobenzaprine (FLEXERIL) 10 mg Oral Tablet    Take 0.5 Tablets (5 mg total) by mouth  Once a day    fluticasone-salmeterol (ADVAIR) 250-50 mcg/dose Inhalation Disk with Device oral diskus inhaler    Take 1 INHALATION by inhalation Twice daily for 90 days    Patient taking differently:  Take 1 INHALATION  by inhalation Twice per day as needed    furosemide (LASIX) 40 mg Oral Tablet    TAKE ONE TABLET BY MOUTH ONCE DAILY FOR 90 DAYS    insulin aspart U-100 (NOVOLOG FLEXPEN U-100 INSULIN) 100 unit/mL (3 mL) Subcutaneous Insulin Pen    PER SLIDING SCALE PROTOCOL AS DIRECTED (MAX DAILY DOSE UP TO 60 UNITS)    insulin degludec (TRESIBA FLEXTOUCH U-100) 100 unit/mL (3 mL) Subcutaneous Insulin Pen    70 units bedtime    insulin lispro (HUMALOG KWIKPEN INSULIN) 100 unit/mL Subcutaneous Insulin Pen    Inject under the skin Twice a day before meals    Metformin (GLUCOPHAGE) 1,000 mg Oral Tablet    Take 1 Tablet (1,000 mg total) by mouth Twice daily    metoprolol tartrate (LOPRESSOR) 25 mg Oral Tablet    Take 1 Tab (25 mg total) by mouth Every 12 hours for 90 days    Patient taking differently:  Take 25 mg by  mouth Once a day     multivitamin Oral Tablet    Take 1 Tablet by mouth Once a day    naproxen (NAPROSYN) 500 mg Oral Tablet    Take 2 Tablets (1,000 mg total) by mouth Once a day    Pen Needle, Disposable, (BD UF ORIGINAL PEN NEEDLE) 29 gauge x 1/2" Needle    USE TO INJECT INSULIN AS DIRECTED UP TO 5 TIMES DAILY    potassium chloride (KLOR-CON) 10 mEq Oral Tablet Sustained Release    TAKE ONE TABLET BY MOUTH ONCE DAILY WITH FOOD AND lasix    rosuvastatin (CRESTOR) 20 mg Oral Tablet    Take 1 Tab (20 mg total) by mouth Every evening for 90 days    sertraline (ZOLOFT) 100 mg Oral Tablet    Take 1 Tablet (100 mg total) by mouth Once a day    traZODone (DESYREL) 150 mg Oral Tablet    take one-half TO one TABLET BY MOUTH EVERY NIGHT (needs TO see connie williams AND schedule appointment WITH dr Jimmey Ralph)    valsartan-hydroCHLOROthiazide (DIOVAN HCT) 320-25 mg Oral Tablet    Take 1 Tab by mouth Once a day for 90  days            Allergies:  Allergies   Allergen Reactions    Acetaminophen      Other reaction(s): Mild hives    Oxycodone (Bulk)      Other reaction(s): Mild hives    Tylox [Oxycodone-Acetaminophen] Hives/ Urticaria     Allergies reviewed with patient    Past Medical History:   Diagnosis Date    Ankle injury     Cellulitis 2013    CPAP (continuous positive airway pressure) dependence     Diabetes     Dizzy spells     bradycardia    Esophageal reflux     takes tums sometimes    HTN     Hyperlipidemia     Knee injury     Morbid obesity (CMS HCC)     Nerve damage     Sleep apnea     c pap; setting 3    Type 2 diabetes mellitus (CMS HCC)     dx'd 5-6 yrs ago    Wears glasses          History reviewed with patient    Past Surgical History:   Procedure Laterality Date    COLONOSCOPY  11/22/2017    Dr Bryson Dames CATARACT REMOVAL Right     HX CATARACT REMOVAL Left 04/01/2019    Phacoemulsification of cataract left eye with IOL placement (04/01/2019).    HX HERNIA REPAIR      Bilateral    HX HERNIA REPAIR      HX HERNIA REPAIR      inguinal and umbilical repair     HX ORCHIOPEXY      HX OTHER      at age 89 to veins twisted in groin    HX TONSILLECTOMY      HX WISDOM TEETH EXTRACTION      KNEE ARTHROSCOPY Bilateral            Social History     Socioeconomic History    Marital status: Married     Spouse name: CAROLYN    Number of children: 1    Years of education: Not on file    Highest education level: Not on file   Occupational History  Employer: [employer]   Tobacco Use    Smoking status: Never    Smokeless tobacco: Never   Vaping Use    Vaping status: Never Used   Substance and Sexual Activity    Alcohol use: No    Drug use: No    Sexual activity: Not on file   Other Topics Concern    Uses Cane Not Asked    Uses walker Not Asked    Uses wheelchair Not Asked    Right hand dominant Yes    Left hand dominant Not Asked    Ambidextrous Not Asked    Shift Work Not Asked    Unusual Sleep-Wake Schedule Not  Asked    Calcium intake adequate Not Asked    Computer Use Not Asked    Drives Yes    Exercise Concern Not Asked    Helmet Use Not Asked    Seat Belt Yes    Ability to Walk 1 Flight of Steps without SOB/CP Yes    Routine Exercise No    Ability to Walk 2 Flight of Steps without SOB/CP Yes    Unable to Ambulate Not Asked    Total Care Not Asked    Ability To Do Own ADL's Yes    Uses Walker Not Asked    Other Activity Level Yes    Uses Cane Not Asked   Social History Narrative    ** Merged History Encounter **          Social Determinants of Health     Financial Resource Strain: Not on file   Transportation Needs: Not on file   Social Connections: Not on file   Intimate Partner Violence: Not on file   Housing Stability: Not on file       Family Medical History:       Problem Relation (Age of Onset)    Cancer Mother    Congestive Heart Failure Maternal Grandmother    Diabetes Paternal Uncle              Physical Exam:  All nurse's notes reviewed.  ED Triage Vitals [10/07/23 0104]   BP (Non-Invasive) 121/84   Heart Rate 97   Respiratory Rate 17   Temperature 36.5 C (97.7 F)   SpO2 96 %   Weight (!) 139 kg (306 lb 7 oz)   Height 1.727 m (5\' 8" )     Constitutional: NAD. Oriented   HENT:   Head: Normocephalic and atraumatic.   Mouth/Throat: Oropharynx is clear and moist.   Eyes: PERRL, EOMI, conjunctivae without discharge bilaterally  Neck: Trachea midline.  The right side of the patient's neck starting at around the occiput on the right side to the base of the neck measuring estimated about 11-12 cm as well as about 6-7 cm wide.  Cephalad portion of it he has about a 1 cm opening that is spontaneously draining copious brownish yellow mucopurulent material.    Musculoskeletal: No edema, tenderness or deformity.  Skin: warm and dry. No rash, erythema, pallor or cyanosis  Psychiatric: normal mood and affect. Behavior is normal.   Neurological: Alert&Ox3. Grossly intact.     Imaging:    Results for orders placed or performed  during the hospital encounter of 10/07/23 (from the past 72 hours)   CT SOFT TISSUE NECK W IV CONTRAST     Status: None    Narrative    Mathan D Guillet    PROCEDURE DESCRIPTION: CT SOFT TISSUE NECK W IV  CONTRAST    CLINICAL INDICATION: left neck and base of skull large abscess, evaluate for deeper tissue involvement/tracking.    COMPARISON: No prior studies were compared.      FINDINGS: Imaged upper lungs are clear. A large area of cellulitis/phlegmon involves the posterior right neck and suboccipital subcutaneous fat. There is likely a tiny abscess within the phlegmon, with dimensions of approximately 1.4 x 0.8 x 1.5 cm. The underlying occipital calvarium is intact. Right transverse sinus and right sigmoid sinus are patent. No obvious involvement of the right posterior paraspinous musculature. The right parotid gland is normal in appearance. Scattered small bilateral cervical chain lymph nodes are noted. The visualized intracranial contents are unremarkable. Paranasal sinuses and mastoid air cells are clear      Impression    Large area cellulitis/phlegmon as above containing a tiny abscess. No evidence for deep tissue involvement.              The CT exam was performed using one or more the following a dose reduction techniques: Automated exposure control, adjustment of the mA and/or kV according to the patient's size, or use of iterative reconstruction technique.      Radiologist location ID: [id number]        The following orders have been placed.    Orders Placed This Encounter    ADULT ROUTINE BLOOD CULTURE, SET OF 2 ADULT BOTTLES (BACTERIA AND YEAST)    ADULT ROUTINE BLOOD CULTURE, SET OF 2 ADULT BOTTLES (BACTERIA AND YEAST)    WOUND, SUPERFICIAL/NON-STERILE SITE, AEROBIC CULTURE AND GRAM STAIN    CT SOFT TISSUE NECK W IV CONTRAST    BASIC METABOLIC PANEL    CBC/DIFF    CBC WITH DIFF    EXTRA TUBES    BLUE TOP TUBE    RED TOP TUBE    C-REACTIVE PROTEIN(CRP),INFLAMMATION    SEDIMENTATION RATE    HGA1C  (HEMOGLOBIN A1C WITH EST AVG GLUCOSE)    PATIENT CLASS/LEVEL OF CARE DESIGNATION - STJ    iopamidol (ISOVUE-300) 61% infusion    insulin R human 100 units/mL injection    NS bolus infusion 1,000 mL    ampicillin-sulbactam (UNASYN) 3 g in NS 100 mL IVPB       Abnormal Lab results:  Labs Ordered/Reviewed   BASIC METABOLIC PANEL - Abnormal; Notable for the following components:       Result Value    SODIUM 130 (*)     POTASSIUM 3.3 (*)     CHLORIDE 90 (*)     ANION GAP 16 (*)     GLUCOSE 536 (*)     All other components within normal limits   C-REACTIVE PROTEIN(CRP),INFLAMMATION - Abnormal; Notable for the following components:    CRP INFLAMMATION 114.1 (*)     All other components within normal limits   ADULT ROUTINE BLOOD CULTURE, SET OF 2 BOTTLES (BACTERIA AND YEAST)   ADULT ROUTINE BLOOD CULTURE, SET OF 2 BOTTLES (BACTERIA AND YEAST)   WOUND, SUPERFICIAL/NON-STERILE SITE, AEROBIC CULTURE AND GRAM STAIN   CBC/DIFF    Narrative:     The following orders were created for panel order CBC/DIFF.  Procedure                               Abnormality         Status                     ---------                               -----------         ------  CBC WITH ZOXW[[id number]]                                    Final result                 Please view results for these tests on the individual orders.   CBC WITH DIFF   EXTRA TUBES    Narrative:     The following orders were created for panel order EXTRA TUBES.  Procedure                               Abnormality         Status                     ---------                               -----------         ------                     BLUE TOP [id number]]                                    In process                 RED TOP HYQM[[id number]]                                     In process                   Please view results for these tests on the individual orders.   BLUE TOP TUBE   RED TOP TUBE   SEDIMENTATION RATE   HGA1C (HEMOGLOBIN A1C WITH EST AVG  GLUCOSE)       EKG: See tracing in EPIC    Plan: Appropriate labs and imaging ordered. Medical Records reviewed.    Therapy/Procedures/Course/MDM: Patient was vitally  stable throughout visit.  Required no medical therapy for pain/fever control.  Required no medical therapy for nausea control.  Patient had  improvement in symptoms over course of ED stay on reassessment.  Laboratory results grossly unremarkable except uncontrolled diabetes.  Imaging  small abscess with phlegmon.  I was able to express several more cc just by pressure out of the abscess.  Discussed with Megan the hospitalist about admitting for IV antibiotics and glucose control as I feel like he is at high risk for worsening infection due to his uncontrolled diabetes.  She is agreeable as long as surgery is agreeable to evaluate.  Discussed with Dr.Faber who is agreeable to consult on the patient.  Admitted.  Results were discussed with patient.   Consults: as above  Impression:     ICD-10-CM    1. Abscess, neck  L02.11       2. Cellulitis  L03.90           Disposition:     Admitted    Eual Fines, MD  [facility] Department of Emergency Medicine

## 2023-10-07 NOTE — Care Plan (Signed)
 Plan of care initiated.     Problem: Adult Inpatient Plan of Care  Goal: Plan of Care Review  Reactivated  Goal: Patient-Specific Goal (Individualized)  Reactivated  Flowsheets (Taken 10/07/2023 0528)  Individualized Care Needs: none  Anxieties, Fears or Concerns: none voiced  Goal: Absence of Hospital-Acquired Illness or Injury  Reactivated  Intervention: Identify and Manage Fall Risk  Recent Flowsheet Documentation  Taken 10/07/2023 0600 by Alden Benjamin, RN  Safety Promotion/Fall Prevention: safety round/check completed  Intervention: Prevent Skin Injury  Recent Flowsheet Documentation  Taken 10/07/2023 0600 by Alden Benjamin, RN  Skin Protection: adhesive use limited  Goal: Optimal Comfort and Wellbeing  Reactivated  Intervention: Provide Person-Centered Care  Recent Flowsheet Documentation  Taken 10/07/2023 0600 by Alden Benjamin, RN  Trust Relationship/Rapport:   care explained   choices provided  Goal: Rounds/Family Conference  Reactivated     Problem: Fall Injury Risk  Goal: Absence of Fall and Fall-Related Injury  Reactivated  Intervention: Promote Injury-Free Environment  Recent Flowsheet Documentation  Taken 10/07/2023 0600 by Alden Benjamin, RN  Safety Promotion/Fall Prevention: safety round/check completed

## 2023-10-07 NOTE — H&P (Signed)
 [facility] -- Admission H&P    Name: [name]  Age: 63 y.o.   Gender: male  MRN: [id number]  Date of Admission:  10/07/2023  PCP: Myrtis Ser, NP  Attending: Sena Hitch, MD  Consultants:   General surgery    Chief Complaint:   Chief Complaint   Patient presents with    Cyst     Complaints of having a abscess on the back of right head that ruptured.        HPI:  This is a 63 y.o. male with history of sleep apnea on CPAP, uncontrolled DM2, and hypertension who was seen in the ER for a chief complaint of cyst.  Patient reports abscess to the back of his neck that started draining purulent fluid yesterday evening.  He was seen at outside clinic on 10/02/23 for sinus congestion, they noticed the abscess at that time and he was given cefdinir with outpatient General surgery referral.  He is unsure of how long the abscess has been there, possibly over a week.  He denies any shortness of breath, fevers/chills, or dizziness.        In ED, workup revealed potassium 3.3, glucose 536, CRP 114.1.  CT soft tissue neck obtained, showed large area of cellulitis/phlegmon containing a tiny abscess, no evidence for deep tissue involvement.  Abscess was drained by ER provider, wound and blood cultures obtained.  Dr. Pearlean Brownie with General surgery consulted in the ER, recommends admission for IV antibiotics.  He received 15 units of regular insulin, 1 L NS bolus, and Unasyn while in the ER.  Patient was admitted to hospitalist service for further evaluation and management.       History:  Past Medical History:   Diagnosis Date    Ankle injury     Cellulitis 2013    CPAP (continuous positive airway pressure) dependence     Diabetes     Dizzy spells     bradycardia    Esophageal reflux     takes tums sometimes    HTN     Hyperlipidemia     Knee injury     Morbid obesity (CMS HCC)     Nerve damage     Sleep apnea     c pap; setting 3    Type 2 diabetes mellitus (CMS HCC)     dx'd 5-6 yrs ago    Wears glasses           Social History     Substance and Sexual Activity   Drug Use No     Allergies   Allergen Reactions    Acetaminophen      Other reaction(s): Mild hives    Oxycodone (Bulk)      Other reaction(s): Mild hives    Tylox [Oxycodone-Acetaminophen] Hives/ Urticaria     Family Medical History:       Problem Relation (Age of Onset)    Cancer Mother    Congestive Heart Failure Maternal Grandmother    Diabetes Paternal Uncle            Social History     Tobacco Use    Smoking status: Never    Smokeless tobacco: Never   Vaping Use    Vaping status: Never Used   Substance Use Topics    Alcohol use: No    Drug use: No       Review of Systems:   General: No changes in weight. No fatigue, weakness,  or fever/chills.  Skin:  Neck abscess.  HEENT/Neck: No headache, dizziness. No acute hearing/vision changes.  No neck stiffness.  Respiratory: No shortness of breath or cough.  CV: No chest pains or palpitations.  GI: No abdominal pain, nausea/vomiting, or change in appetite/bowel habits.  Peripheral Vasc: No new leg swelling or tenderness.  GU: No dysuria or hematuria.  Musculoskeletal: No acute trauma. No joint pain or stiffness.   Psych/Neuro: No mood changes. No change in memory or speech.   Heme: No easy bruising or bleeding.  No swollen lymph nodes.  Endo: No polydipsia or polyuria.    Filed Vitals:    10/07/23 0104 10/07/23 0230   BP: 121/84 133/86   Pulse: 97 86   Resp: 17 18   Temp: 36.5 C (97.7 F)    SpO2: 96% 93%       Physical Exam:   General Survey:       Appears stated age, obese. No acute distress. No involuntary movements.   Skin:     Warm, dry.  Draining abscess to right neck/occipital area with surrounding erythema and edema.   HEENT:     Normocephalic/atraumatic. Conjunctivae pink and without injection, PERRL. No erythema or tenderness. Oral mucosa pink, moist, and intact.  Neck:      Trachea midline. Normal range of motion. Neck supple.   Pulmonary:      No increased respiratory effort. Lungs clear to  auscultation bilaterally.   Cardiovascular:      Regular rate and rhythm. S1/S2. No extra heart sounds or murmurs.  Abdominal:      Bowel sounds are active. Abdomen is soft, rounded, no tenderness or masses.  Extremities/Peripheral Vascular:     No edema, deformity, or discoloration. Pulses 2+.  Musculoskeletal:      ROM appropriate for age. No joint tenderness or swelling.  Neurological:      No focal deficit present. CN II - XII grossly intact. Alert and oriented to person, place, and time. Mental status is at baseline.   Psychiatric:         Normal affect, behavior, memory, thought process, judgement, and speech.    Recent Labs/Radiology:  Results for orders placed or performed during the hospital encounter of 10/07/23 (from the past 24 hours)   BASIC METABOLIC PANEL   Result Value Ref Range    SODIUM 130 (L) 136 - 145 mmol/L    POTASSIUM 3.3 (L) 3.5 - 5.1 mmol/L    CHLORIDE 90 (L) 96 - 111 mmol/L    CO2 TOTAL 24 23 - 31 mmol/L    ANION GAP 16 (H) 4 - 13 mmol/L    CALCIUM 9.1 8.6 - 10.3 mg/dL    GLUCOSE 284 (HH) 65 - 125 mg/dL    BUN 20 8 - 25 mg/dL    CREATININE 1.32 4.40 - 1.35 mg/dL    BUN/CREA RATIO 18 6 - 22    ESTIMATED GFR - MALE 77 >=60 mL/min/BSA   CBC/DIFF    Narrative    The following orders were created for panel order CBC/DIFF.  Procedure                               Abnormality         Status                     ---------                               -----------         ------  CBC WITH ZOXW[[id number]]                                    Final result                 Please view results for these tests on the individual orders.   CBC WITH DIFF   Result Value Ref Range    WBC 9.8 3.7 - 11.0 x10^3/uL    RBC 4.71 4.50 - 6.10 x10^6/uL    HGB 14.1 13.4 - 17.5 g/dL    HCT 11.9 14.7 - 82.9 %    MCV 87.9 78.0 - 100.0 fL    MCH 29.9 26.0 - 32.0 pg    MCHC 34.1 31.0 - 35.5 g/dL    RDW-CV 56.2 13.0 - 86.5 %    PLATELETS 272 150 - 400 x10^3/uL    MPV 9.4 8.7 - 12.5 fL    NEUTROPHIL % 67.4 %     LYMPHOCYTE % 19.4 %    MONOCYTE % 9.5 %    EOSINOPHIL % 2.1 %    BASOPHIL % 0.8 %    NEUTROPHIL # 6.58 1.50 - 7.70 x10^3/uL    LYMPHOCYTE # 1.90 1.00 - 4.80 x10^3/uL    MONOCYTE # 0.93 0.20 - 1.10 x10^3/uL    EOSINOPHIL # 0.21 <=0.50 x10^3/uL    BASOPHIL # <0.10 <=0.20 x10^3/uL    IMMATURE GRANULOCYTE % 0.8 0.0 - 1.0 %    IMMATURE GRANULOCYTE # <0.10 <0.10 x10^3/uL   EXTRA TUBES    Narrative    The following orders were created for panel order EXTRA TUBES.  Procedure                               Abnormality         Status                     ---------                               -----------         ------                     BLUE TOP HQIO[[id number]]                                    In process                 RED TOP [id number]]                                     In process                   Please view results for these tests on the individual orders.     CT SOFT TISSUE NECK W IV CONTRAST   Final Result   Large area cellulitis/phlegmon as above containing a tiny abscess. No evidence for deep tissue involvement.                     The CT exam was performed using one or more the  following a dose reduction techniques: Automated exposure control, adjustment of the mA and/or kV according to the patient's size, or use of iterative reconstruction technique.         Radiologist location ID: [id number]             Assessment and Plan:  Cutaneous abscess of neck  Patient Active Problem List   Diagnosis    Chest pain    HTN (hypertension)    Diabetes mellitus (CMS HCC)    Hyperlipidemia    Morbid obesity (CMS HCC)    Sleep apnea    Cellulitis, scrotum    Sleep apnea    Cutaneous abscess of neck    CPAP (continuous positive airway pressure) dependence       -Cutaneous neck abscess/cellulitis:  General surgery consulted.  Continue Unasyn.    -DM2 with hyperglycemia:  Last A1c in July 2024 was 12.4, new A1c pending.  Diabetic diet with fingersticks and SSI.  Continue IV fluid.    -Other chronic medical conditions:   Continue home medications as indicated.  Full code.    DVT prophylaxis:  Lovenox    Reita Chard MSN, APRN, FNP-C

## 2023-10-07 NOTE — Progress Notes (Addendum)
 [facility]   Progress Note    [name]  Date of service: 10/07/2023  Date of Admission:  10/07/2023  Hospital Day:  LOS: 0 days     Antibiotic day 1., IV ampicillin-clavulanic acid and p.o. doxycycline      Subjective:     Patient feels considerably better than he did on admission.    His nasal congestion, postnasal drainage and cough seemed to be improving.      Patient is without new complaints and/or concerns.      Patient is eating and drinking well.  He has not moved his bowels since hospital admission.  Patient is urinating without difficulty.         Objective:      Vital Signs:  Vitals:    10/07/23 0500 10/07/23 0520 10/07/23 0521 10/07/23 0528   BP: (!) 142/75  132/75    Pulse:   98    Resp:   20    Temp:   36.9 C (98.4 F)    SpO2: 92%  91%    Weight:    134 kg (294 lb 5 oz)   Height:  1.727 m (5\' 8" )     BMI:              I/O:  I/O last 24 hours:    Intake/Output Summary (Last 24 hours) at 10/07/2023 0851  Last data filed at 10/07/2023 0829  Gross per 24 hour   Intake 360 ml   Output --   Net 360 ml          amLODIPine (NORVASC) tablet, 5 mg, Oral, Daily  ampicillin-sulbactam (UNASYN) 3 g in NS 100 mL IVPB, 3 g, Intravenous, Q6H  aspirin chewable tablet 81 mg, 81 mg, Oral, Daily  Correction/SSIP insulin lispro 100 units/mL injection, 1-5 Units, Subcutaneous, 4x/day AC  cyanocobalamin (VITAMIN B12) tablet, 1,000 mcg, Oral, Daily  D10W bolus infusion 250 mL, 250 mL, Intravenous, Q1H PRN  dextrose (GLUTOSE) 40% oral gel, 15 g, Oral, Q15 Min PRN  enoxaparin (LOVENOX) 40 mg/0.4 mL SubQ injection, 40 mg, Subcutaneous, Q24H  [Held by provider] furosemide (LASIX) tablet, 40 mg, Oral, Daily  glucagon (GLUCAGEN) injection 1 mg, 1 mg, IntraMUSCULAR, Once PRN  [Held by provider] metFORMIN (GLUCOPHAGE) tablet, 1,000 mg, Oral, 2x/day  metoprolol tartrate (LOPRESSOR) tablet, 25 mg, Oral, Daily  multivitamin (THERA) tablet, 1 Tablet, Oral, Daily  NS 1000 mL with potassium chloride 20 mEq premix  infusion, , Intravenous, Continuous  NS flush syringe, 5 mL, Intracatheter, 2x/day   And  NS flush syringe, 5 mL, Intracatheter, Q1 MIN PRN  ondansetron (ZOFRAN) 2 mg/mL injection, 4 mg, Intravenous, Q6H PRN  [START ON 10/08/2023] sertraline (ZOLOFT) tablet, 100 mg, Oral, Daily        Physical Exam:    General/Constitutional:  Patient is awake and appears alert.  He is resting comfortably and appears to be no acute distress.  Patient is pleasant and cooperative with examination.  Cardiovascular:  There is no JVD, although, exam is difficult.  Heart sounds are distant but appear regular.  It is difficult to assess for murmurs, rubs and/or gallops secondary to distant heart sounds.  There is bilateral lower extremity lymphedema.  Pedal pulses are diminished bilaterally.  However, lower extremities appear warm and well perfused.  Pulmonary/Chest:  Patient is breathing comfortably on room air.  Gastrointestinal:  There is truncal obesity.  Bowel sounds are present.  Abdomen is soft and nontender.  It is difficult to assess  for hepatosplenomegaly and/or masses secondary to body habitus.  GU:  Not examined.  Musculoskeletal:  Extremities are free of cyanosis.  Skin:  Skin exam is limited.  There is evidence tinea cruris.  There is brawny discoloration of bilateral pretibial regions as well.  There is a reddened and erythematous area over the right side of posterior neck with evidence of purulent drainage.        Labs:  Results for orders placed or performed during the hospital encounter of 10/07/23 (from the past 24 hours)   BASIC METABOLIC PANEL   Result Value Ref Range    SODIUM 130 (L) 136 - 145 mmol/L    POTASSIUM 3.3 (L) 3.5 - 5.1 mmol/L    CHLORIDE 90 (L) 96 - 111 mmol/L    CO2 TOTAL 24 23 - 31 mmol/L    ANION GAP 16 (H) 4 - 13 mmol/L    CALCIUM 9.1 8.6 - 10.3 mg/dL    GLUCOSE 295 (HH) 65 - 125 mg/dL    BUN 20 8 - 25 mg/dL    CREATININE 1.88 4.16 - 1.35 mg/dL    BUN/CREA RATIO 18 6 - 22    ESTIMATED GFR - MALE 77  >=60 mL/min/BSA   CBC/DIFF    Narrative    The following orders were created for panel order CBC/DIFF.  Procedure                               Abnormality         Status                     ---------                               -----------         ------                     CBC WITH SAYT[[id number]]                                    Final result                 Please view results for these tests on the individual orders.   CBC WITH DIFF   Result Value Ref Range    WBC 9.8 3.7 - 11.0 x10^3/uL    RBC 4.71 4.50 - 6.10 x10^6/uL    HGB 14.1 13.4 - 17.5 g/dL    HCT 35.5 73.2 - 20.2 %    MCV 87.9 78.0 - 100.0 fL    MCH 29.9 26.0 - 32.0 pg    MCHC 34.1 31.0 - 35.5 g/dL    RDW-CV 54.2 70.6 - 23.7 %    PLATELETS 272 150 - 400 x10^3/uL    MPV 9.4 8.7 - 12.5 fL    NEUTROPHIL % 67.4 %    LYMPHOCYTE % 19.4 %    MONOCYTE % 9.5 %    EOSINOPHIL % 2.1 %    BASOPHIL % 0.8 %    NEUTROPHIL # 6.58 1.50 - 7.70 x10^3/uL    LYMPHOCYTE # 1.90 1.00 - 4.80 x10^3/uL    MONOCYTE # 0.93 0.20 - 1.10 x10^3/uL    EOSINOPHIL # 0.21 <=0.50 x10^3/uL    BASOPHIL # <0.10 <=0.20 x10^3/uL  IMMATURE GRANULOCYTE % 0.8 0.0 - 1.0 %    IMMATURE GRANULOCYTE # <0.10 <0.10 x10^3/uL   EXTRA TUBES    Narrative    The following orders were created for panel order EXTRA TUBES.  Procedure                               Abnormality         Status                     ---------                               -----------         ------                     BLUE TOP UXLK[[id number]]                                    Final result               RED TOP DGUY[[id number]]                                     Final result                 Please view results for these tests on the individual orders.   BLUE TOP TUBE   Result Value Ref Range    RAINBOW/EXTRA TUBE AUTO RESULT Yes    RED TOP TUBE   Result Value Ref Range    RAINBOW/EXTRA TUBE AUTO RESULT Yes    C-REACTIVE PROTEIN(CRP),INFLAMMATION   Result Value Ref Range    CRP INFLAMMATION 114.1 (H) <8.0 mg/L   SEDIMENTATION RATE    Result Value Ref Range    ERYTHROCYTE SEDIMENTATION RATE (ESR) 62 (H) 0 - 20 mm/hr   EXTRA TUBES - STJ    Narrative    The following orders were created for panel order EXTRA TUBES - STJ.  Procedure                               Abnormality         Status                     ---------                               -----------         ------                     LIGHT GREEN TOP DGLO[[id number]]                             In process                   Please view results for these tests on the individual orders.   POC BLOOD GLUCOSE (RESULTS)   Result Value Ref Range    GLUCOSE, POC 388 (H) 60 - 110 mg/dl  Imaging:    Results for orders placed or performed during the hospital encounter of 10/07/23 (from the past 24 hours)   CT SOFT TISSUE NECK W IV CONTRAST     Status: None    Narrative    [name]    PROCEDURE DESCRIPTION: CT SOFT TISSUE NECK W IV CONTRAST    CLINICAL INDICATION: left neck and base of skull large abscess, evaluate for deeper tissue involvement/tracking.    COMPARISON: No prior studies were compared.      FINDINGS: Imaged upper lungs are clear. A large area of cellulitis/phlegmon involves the posterior right neck and suboccipital subcutaneous fat. There is likely a tiny abscess within the phlegmon, with dimensions of approximately 1.4 x 0.8 x 1.5 cm. The underlying occipital calvarium is intact. Right transverse sinus and right sigmoid sinus are patent. No obvious involvement of the right posterior paraspinous musculature. The right parotid gland is normal in appearance. Scattered small bilateral cervical chain lymph nodes are noted. The visualized intracranial contents are unremarkable. Paranasal sinuses and mastoid air cells are clear      Impression    Large area cellulitis/phlegmon as above containing a tiny abscess. No evidence for deep tissue involvement.              The CT exam was performed using one or more the following a dose reduction techniques: Automated exposure control,  adjustment of the mA and/or kV according to the patient's size, or use of iterative reconstruction technique.      Radiologist location ID: [id number]           Microbiology:  No results found for any visits on 10/07/23 (from the past 96 hours).        Assessment/Plan:   Active Hospital Problems    Diagnosis    Primary Problem: Cutaneous abscess of neck    CPAP (continuous positive airway pressure) dependence    HTN (hypertension)    Diabetes mellitus (CMS HCC)    Morbid obesity (CMS HCC)          MY ORDERS LAST 24 (24h ago, onward)       Start     Ordered    10/07/23 0600  POC BLOOD GLUCOSE (RESULTS)  PROCEDURE ONCE         10/07/23 0549                  Problem list present on admission:    1. Posterior neck soft tissue infection with abscess and surrounding cellulitis:   Etiology  Requiring hospital admission for broad-spectrum IV antibiotics, surgical I&D of absence, supportive care as well as further evaluation and treatment  Requiring administration of probiotic while receiving antibiotic treatment  WBC count unremarkable on admission  ESR elevated at 62 on admission  C reactive protein elevated at 114.1 on admission  MRSA/ MSSA screen nares by PCR positive  Nonsterile superficial wound culture (of limited value) pending  Blood culture x2 pending  CT scan soft tissue neck (without IV contrast enhancement) dated 10/07/2023 showed (1) large area of cellulitis/ phlegmon as well as (2) tiny abscess as well as (3) no evidence of deep tissue involvement  General surgery consulted  Requiring consideration for further interventions pending repeat clinical assessment and test results    2. Hyperglycemia in poor controlled mellitus type 2:  Hemoglobin A1c 12.5 on 10/02/2018  Repeat hemoglobin A1c pending  Requiring close monitoring of blood sugars prior to meals and at bedtime, particularly, in light of  presenting hyperglycemia in the setting of acute infectious illness  Requiring coverage of elevated blood sugars with  an aggressive correctional insulin sliding scale  Requiring consideration for further interventions pending repeat clinical assessment and test results    3. Obstructive sleep apnea requiring CPAP:  Encouraging patient to use home CPAP while in hospital    4. Obesity:  Obesity with obesity related health concerns  Requiring further evaluation and treatment by PCP and consultants as an outpatient    5. Bilateral lower extremity lymphedema:    6. Bilateral lower extremity venous stasis:    7. Medical noncompliance:    8. Deep vein thrombosis prophylaxis:    Patient appears to be at moderate high risk for VTE due to acute medical illness and relative immobility  Requiring VTE chemo-prophylaxis with subcu enoxaparin adjusted for age, weight and kidney functions        DVT/PE Prophylaxis: Enoxaparin        Disposition Planning:  Anticipate return to home.        Sena Hitch, MD    This note was partially generated using MModal Fluency Direct system, and there may be some incorrect words, spellings, and punctuation that were not noted in checking the note before saving

## 2023-10-07 NOTE — Pharmacy (Signed)
 [facility]  Therapeutic Drug Monitoring: Vancomycin  10/07/2023      Patient name: [name], [name]  Date of Birth:  March 04, 1961    Actual Weight:  Weight: 134 kg (294 lb 5 oz) (10/07/23 0528)     BMI:  BMI (Calculated): 44.84 (10/07/23 0528)      Date RPh Current regimen (including mg/kg) Indication &  Organism AUC or trough based dosing Target Levels^ SCr (mg/dL) CrCl* (mL/min) Infectious Laboratory Markers (as applicable)   Measured level(s)   (mcg/mL) Calculated AUC (if AUC based monitoring) Plan & predicted AUC/trough if initial dosing (including when levels are due) Comments   02/16 AMB Vanc 1500 mg IV (15 mg/kg adjusted) q12h  SSTI AUC 400-600  1.10 WBC: 9.8  Procal:  CRP:   Pt with ruptured abscess requires Vanc. Labs between 4th and 5th dose.  Projected AUC 552                                                                                                   ^Target levels depends on dosing and monitoring method, AUC vs. trough based. For AUC based dosing units are mg*h/L. For trough based dosing units are mcg/mL.     *Creatinine clearance is estimated by using the Cockcroft-Gault equation for adult patients and the Brendolyn Patty for pediatric patients.    The decision to discontinue vancomycin therapy will be determined by the primary service.  Please contact the pharmacist with any questions regarding this patient's medication regimen.

## 2023-10-07 NOTE — Nurses Notes (Signed)
 Am assessment  Alert and Oriented lungs clear heart rhythm normal. Bowel sounds hyperactive. 2+ edema BLE no pain reported no needs voiced.

## 2023-10-07 NOTE — Nurses Notes (Signed)
Dr. Mohan in to see patient.

## 2023-10-07 NOTE — Nurses Notes (Addendum)
 Patient arrived to room 219 via wheelchair from ED. Alert and oriented. VSS on RA. IVF initiated. Admission completed. Assessment per flowsheet. No c/o pain at this time. Patient oriented to room and nurse call bell system. No needs voiced at this time.       BP 132/75   Pulse 98   Temp 36.9 C (98.4 F)   Resp 20   Ht 1.727 m (5\' 8" )   Wt 134 kg (294 lb 5 oz)   SpO2 91%   BMI 44.75 kg/m

## 2023-10-07 NOTE — ED Nurses Note (Signed)
 Report called to [facility] RN Med Surg.  Room 219 is ready for patient.  Patient to be transported in wheelchair by ED Tech.

## 2023-10-07 NOTE — Care Plan (Signed)
 Plan of care reviewed. VSS alert and oriented IV fluids and antibiotics maintained. FSBS/SSI given per [facility] no acute changes no needs voiced call bell in reach.   Problem: Adult Inpatient Plan of Care  Goal: Plan of Care Review  Outcome: Ongoing (see interventions/notes)  Goal: Patient-Specific Goal (Individualized)  Outcome: Ongoing (see interventions/notes)  Flowsheets (Taken 10/07/2023 0829)  Individualized Care Needs: IVF AND iv ABX  Anxieties, Fears or Concerns: none voiced  Goal: Absence of Hospital-Acquired Illness or Injury  Outcome: Ongoing (see interventions/notes)  Intervention: Identify and Manage Fall Risk  Recent Flowsheet Documentation  Taken 10/07/2023 0829 by Kayleen Memos, RN  Safety Promotion/Fall Prevention:   activity supervised   safety round/check completed  Intervention: Prevent Skin Injury  Recent Flowsheet Documentation  Taken 10/07/2023 6045 by Kayleen Memos, RN  Skin Protection: adhesive use limited  Goal: Optimal Comfort and Wellbeing  Outcome: Ongoing (see interventions/notes)  Intervention: Provide Person-Centered Care  Recent Flowsheet Documentation  Taken 10/07/2023 4098 by Kayleen Memos, RN  Trust Relationship/Rapport:   care explained   choices provided  Goal: Rounds/Family Conference  Outcome: Ongoing (see interventions/notes)     Problem: Wound  Goal: Optimal Coping  Outcome: Ongoing (see interventions/notes)  Goal: Optimal Functional Ability  Outcome: Ongoing (see interventions/notes)  Intervention: Optimize Functional Ability  Recent Flowsheet Documentation  Taken 10/07/2023 0829 by Kayleen Memos, RN  Activity Management: ambulated in room  Goal: Absence of Infection Signs and Symptoms  Outcome: Ongoing (see interventions/notes)  Intervention: Prevent or Manage Infection  Recent Flowsheet Documentation  Taken 10/07/2023 0829 by Kayleen Memos, RN  Fever Reduction/Comfort Measures:   lightweight bedding   lightweight clothing  Goal: Improved Oral Intake  Outcome: Ongoing (see interventions/notes)  Goal:  Optimal Pain Control and Function  Outcome: Ongoing (see interventions/notes)  Goal: Skin Health and Integrity  Outcome: Ongoing (see interventions/notes)  Intervention: Optimize Skin Protection  Recent Flowsheet Documentation  Taken 10/07/2023 0829 by Kayleen Memos, RN  Pressure Reduction Devices: Repositioning wedges/pillows utilized  Activity Management: ambulated in room  Goal: Optimal Wound Healing  Outcome: Ongoing (see interventions/notes)  Intervention: Promote Wound Healing  Recent Flowsheet Documentation  Taken 10/07/2023 0829 by Kayleen Memos, RN  Pressure Reduction Devices: Repositioning wedges/pillows utilized  Activity Management: ambulated in room     Problem: Fall Injury Risk  Goal: Absence of Fall and Fall-Related Injury  Outcome: Ongoing (see interventions/notes)  Intervention: Promote Injury-Free Environment  Recent Flowsheet Documentation  Taken 10/07/2023 1191 by Kayleen Memos, RN  Safety Promotion/Fall Prevention:   activity supervised   safety round/check completed

## 2023-10-08 ENCOUNTER — Observation Stay (HOSPITAL_COMMUNITY): Payer: No Typology Code available for payment source | Admitting: Certified Registered"

## 2023-10-08 ENCOUNTER — Encounter (HOSPITAL_COMMUNITY)
Admission: EM | Disposition: A | Discharge status: Home / Self Care | Payer: Self-pay | Source: Home / Self Care | Attending: Emergency Medicine

## 2023-10-08 HISTORY — PX: HX INCISION AND DRAINAGE: 2100001119

## 2023-10-08 LAB — POC BLOOD GLUCOSE (RESULTS)
GLUCOSE, POC: 243 mg/dL — ABNORMAL HIGH (ref 60–110)
GLUCOSE, POC: 215 mg/dL — ABNORMAL HIGH (ref 60–110)
GLUCOSE, POC: 361 mg/dL — ABNORMAL HIGH (ref 60–110)
GLUCOSE, POC: 339 mg/dL — ABNORMAL HIGH (ref 60–110)

## 2023-10-08 LAB — BASIC METABOLIC PANEL
ANION GAP: 9 mmol/L (ref 4–13)
BUN/CREA RATIO: 15 (ref 6–22)
BUN: 12 mg/dL (ref 8–25)
CALCIUM: 8.1 mg/dL — ABNORMAL LOW (ref 8.6–10.3)
CHLORIDE: 103 mmol/L (ref 96–111)
CO2 TOTAL: 26 mmol/L (ref 23–31)
CREATININE: 0.81 mg/dL (ref 0.75–1.35)
ESTIMATED GFR - MALE: >90 mL/min/BSA (ref >=60)
GLUCOSE: 255 mg/dL — ABNORMAL HIGH (ref 65–125)
POTASSIUM: 3.4 mmol/L — ABNORMAL LOW (ref 3.5–5.1)
SODIUM: 138 mmol/L (ref 136–145)

## 2023-10-08 LAB — LAVENDER TOP TUBE

## 2023-10-08 SURGERY — INCISION AND DRAINAGE ABSCESS
Anesthesia: General | Site: Neck | Wound class: Dirty or Infected Wounds-Include old traumatic wounds

## 2023-10-08 MED ORDER — FENTANYL (PF) 50 MCG/ML INJECTION SOLUTION
INTRAMUSCULAR | Status: AC
Start: 2023-10-08 — End: 2023-10-08
  Filled 2023-10-08: qty 2

## 2023-10-08 MED ORDER — PROPOFOL 10 MG/ML IV BOLUS
INJECTION | Freq: Once | INTRAVENOUS | Status: DC | PRN
Start: 2023-10-08 — End: 2023-10-08
  Administered 2023-10-08: 200 mg via INTRAVENOUS

## 2023-10-08 MED ORDER — BUPIVACAINE (PF) 0.25 % (2.5 MG/ML) INJECTION SOLUTION
INTRAMUSCULAR | Status: AC
Start: 2023-10-08 — End: 2023-10-08
  Filled 2023-10-08: qty 30

## 2023-10-08 MED ORDER — ONDANSETRON HCL (PF) 4 MG/2 ML INJECTION SOLUTION
4.0000 mg | Freq: Once | INTRAMUSCULAR | Status: DC | PRN
Start: 2023-10-08 — End: 2023-10-08

## 2023-10-08 MED ORDER — HYDROMORPHONE 1 MG/ML INJECTION WRAPPER
0.2000 mg | INJECTION | INTRAMUSCULAR | Status: DC | PRN
Start: 2023-10-08 — End: 2023-10-08

## 2023-10-08 MED ORDER — FENTANYL (PF) 50 MCG/ML INJECTION SOLUTION
Freq: Once | INTRAMUSCULAR | Status: DC | PRN
Start: 2023-10-08 — End: 2023-10-08
  Administered 2023-10-08 (×2): 50 ug via INTRAVENOUS

## 2023-10-08 MED ORDER — SODIUM CHLORIDE 0.9 % INTRAVENOUS SOLUTION
INTRAVENOUS | Status: DC
Start: 2023-10-08 — End: 2023-10-08

## 2023-10-08 MED ORDER — DOCUSATE SODIUM 100 MG CAPSULE
100.0000 mg | ORAL_CAPSULE | Freq: Two times a day (BID) | ORAL | Status: DC
Start: 2023-10-08 — End: 2023-10-09
  Administered 2023-10-08 – 2023-10-09 (×3): 0 mg via ORAL
  Filled 2023-10-08: qty 1

## 2023-10-08 MED ORDER — HYDROMORPHONE 1 MG/ML INJECTION WRAPPER
0.4000 mg | INJECTION | INTRAMUSCULAR | Status: DC | PRN
Start: 2023-10-08 — End: 2023-10-08

## 2023-10-08 MED ORDER — DEXAMETHASONE SODIUM PHOSPHATE 4 MG/ML INJECTION SOLUTION
Freq: Once | INTRAMUSCULAR | Status: DC | PRN
Start: 2023-10-08 — End: 2023-10-08
  Administered 2023-10-08: 8 mg via INTRAVENOUS

## 2023-10-08 MED ORDER — LIDOCAINE HCL 10 MG/ML (1 %) INJECTION SOLUTION
INTRAMUSCULAR | Status: AC
Start: 2023-10-08 — End: 2023-10-08
  Filled 2023-10-08: qty 20

## 2023-10-08 MED ORDER — LIDOCAINE (PF) 20 MG/ML (2 %) INJECTION SOLUTION
INTRAMUSCULAR | Status: AC
Start: 2023-10-08 — End: 2023-10-08
  Filled 2023-10-08: qty 5

## 2023-10-08 MED ORDER — MIDAZOLAM 1 MG/ML INJECTION WRAPPER
INTRAMUSCULAR | Status: AC
Start: 2023-10-08 — End: 2023-10-08
  Filled 2023-10-08: qty 2

## 2023-10-08 MED ORDER — LIDOCAINE (PF) 20 MG/ML (2 %) INTRAVENOUS SOLUTION
Freq: Once | INTRAVENOUS | Status: DC | PRN
Start: 2023-10-08 — End: 2023-10-08
  Administered 2023-10-08: 5 mL via INTRAVENOUS

## 2023-10-08 MED ORDER — LACTATED RINGERS INTRAVENOUS SOLUTION
INTRAVENOUS | Status: DC | PRN
Start: 2023-10-08 — End: 2023-10-08

## 2023-10-08 MED ORDER — ONDANSETRON HCL (PF) 4 MG/2 ML INJECTION SOLUTION
Freq: Once | INTRAMUSCULAR | Status: DC | PRN
Start: 2023-10-08 — End: 2023-10-08
  Administered 2023-10-08: 4 mg via INTRAVENOUS

## 2023-10-08 MED ORDER — DEXAMETHASONE SODIUM PHOSPHATE (PF) 10 MG/ML INJECTION SOLUTION
INTRAMUSCULAR | Status: AC
Start: 2023-10-08 — End: 2023-10-08
  Filled 2023-10-08: qty 1

## 2023-10-08 MED ORDER — ONDANSETRON HCL (PF) 4 MG/2 ML INJECTION SOLUTION
INTRAMUSCULAR | Status: AC
Start: 2023-10-08 — End: 2023-10-08
  Filled 2023-10-08: qty 2

## 2023-10-08 MED ORDER — VANCOMYCIN 1,000 MG INTRAVENOUS INJECTION
INTRAVENOUS | Status: AC
Start: 2023-10-08 — End: 2023-10-09
  Filled 2023-10-08: qty 20

## 2023-10-08 MED ORDER — SODIUM CHLORIDE 0.9 % INTRAVENOUS SOLUTION
INTRAVENOUS | Status: DC | PRN
Start: 2023-10-08 — End: 2023-10-08
  Administered 2023-10-08: 0 via INTRAVENOUS

## 2023-10-08 MED ORDER — MIDAZOLAM 1 MG/ML INJECTION WRAPPER
Freq: Once | INTRAMUSCULAR | Status: DC | PRN
Start: 2023-10-08 — End: 2023-10-08
  Administered 2023-10-08: 2 mg via INTRAVENOUS

## 2023-10-08 MED ORDER — POTASSIUM CHLORIDE ER 10 MEQ CAPSULE,EXTENDED RELEASE
20.0000 meq | ORAL_CAPSULE | Freq: Once | ORAL | Status: AC
Start: 2023-10-08 — End: 2023-10-08
  Administered 2023-10-08: 20 meq via ORAL
  Filled 2023-10-08: qty 2

## 2023-10-08 MED ORDER — PROPOFOL 10 MG/ML INTRAVENOUS EMULSION
INTRAVENOUS | Status: AC
Start: 2023-10-08 — End: 2023-10-08
  Filled 2023-10-08: qty 20

## 2023-10-08 SURGICAL SUPPLY — 7 items
CONV USE 337881 - PACK SURG GN STRL DISP LF (CUSTOM TRAYS & PACK) ×1 IMPLANT
ELECTRODE ESURG BLADE PNCL 10FT EDGE STRL PVC DISP BUTTON SWH CORD HLSTR LF  ACPT 3/32IN STD SHAFT (SURGICAL CUTTING SUPPLIES) ×1 IMPLANT
HANDLE RIGID STRL LF  DISP DVN SURG LIGHT (MED SURG SUPPLIES) ×1 IMPLANT
PACK GENERAL - ST ~~LOC~~ HOSPITAL (CUSTOM TRAYS & PACK) ×1 IMPLANT
PAD EG 2.8MR DDRGL SLD RTN ELECTRODE CABLE ADULT DISP (SURGICAL CUTTING SUPPLIES) ×1 IMPLANT
PEN SURG MRKNG DVN SKIN DISP NONSMEAR REG TIP FLXB RLR LBL GNTN VIOL STRL LF (MED SURG SUPPLIES) ×1 IMPLANT
TOWEL SURG BLU 27X17IN STD COTTON RADOPQ ABS PREWASH DELINT STRL LF  DISP (MED SURG SUPPLIES) ×1 IMPLANT

## 2023-10-08 NOTE — Nurses Notes (Signed)
 Dr Edwin Dada in to see patient.

## 2023-10-08 NOTE — Nurses Notes (Signed)
Dr. Mohan in to see patient.

## 2023-10-08 NOTE — Anesthesia Transfer of Care (Signed)
 ANESTHESIA TRANSFER OF CARE   [name] is a 63 y.o. ,male, Weight: 134 kg (294 lb 5 oz)   had Procedure(s):  INCISION AND DRAINAGE ABSCESS- POSTERIOR NECK  performed  10/08/23   Primary Service: Sena Hitch, MD    Past Medical History:   Diagnosis Date    Ankle injury     Cellulitis 2013    CPAP (continuous positive airway pressure) dependence     Diabetes     Dizzy spells     bradycardia    Esophageal reflux     takes tums sometimes    HTN     Hyperlipidemia     Knee injury     Morbid obesity (CMS HCC)     Nerve damage     Sleep apnea     c pap; setting 3    Type 2 diabetes mellitus (CMS HCC)     dx'd 5-6 yrs ago    Wears glasses       Allergy History as of 10/08/23       OXYCODONE-ACETAMINOPHEN         Noted Status Severity Type Reaction    12/04/11 1256 Linger, Clearence Cheek, MA 07/20/10 Deleted       07/20/10 1527 Lesleigh Noe, RN 07/20/10 Active                 OXYCODONE-ACETAMINOPHEN         Noted Status Severity Type Reaction    03/26/19 1542 Zigmund Gottron, RN 09/03/11 Active Low  Hives/ Urticaria    09/03/11 2234 Francena Hanly, RN 09/03/11 Active                 ACETAMINOPHEN         Noted Status Severity Type Reaction    02/01/22 1512 Danne Baxter, RN 02/01/22 Active       Comments: Other reaction(s): Mild hives               OXYCODONE (BULK)         Noted Status Severity Type Reaction    02/01/22 1512 Danne Baxter, RN 02/01/22 Active       Comments: Other reaction(s): Mild hives                   I completed my transfer of care / handoff to the receiving personnel during which we discussed:  Access, Airway, All key/critical aspects of case discussed, Analgesia, Antibiotics, Expectation of post procedure, Fluids/Product, Gave opportunity for questions and acknowledgement of understanding, Labs and PMHx      Post Location: PACU                                                             Last OR Temp: Temperature: 36.6 C (97.9 F)  ABG:  PCO2   Date Value Ref Range Status   07/20/2010 43.0  41.0 - 51.0 mm Hg Final     PO2   Date Value Ref Range Status   07/20/2010 41 35 - 50 mm Hg Final     POTASSIUM   Date Value Ref Range Status   10/08/2023 3.4 (L) 3.5 - 5.1 mmol/L Final   09/03/2011 3.4 (L) 3.5 - 5.1 mmol/L Final     CALCIUM   Date  Value Ref Range Status   10/08/2023 8.1 (L) 8.6 - 10.3 mg/dL Final     Comment:     Gadolinium-containing contrast can interfere with calcium measurement.     09/03/2011 8.5 8.5 - 10.4 mg/dL Final     Calculated P Axis   Date Value Ref Range Status   07/02/2019 40 degrees Final     Calculated R Axis   Date Value Ref Range Status   07/02/2019 23 degrees Final     Calculated T Axis   Date Value Ref Range Status   07/02/2019 50 degrees Final     GLUCOSE, POINT OF CARE   Date Value Ref Range Status   09/06/2011 168 (H) 70 - 105 mg/dL Final     Comment:     RN Notified     HEMOGLOBIN   Date Value Ref Range Status   07/20/2010 15.4 13.5 - 18.0 g/dL Final     Y8MV   Date Value Ref Range Status   07/20/2010 67.7 40.0 - 70.0 % Final     CARBOHYHEMOGLOBIN   Date Value Ref Range Status   07/20/2010 1.6 (H) 0.0 - 1.5 % Final     MET-HEMOGLOBIN   Date Value Ref Range Status   07/20/2010 0.0 0.0 - 3.0 % Final     BASE EXCESS   Date Value Ref Range Status   07/20/2010 Test Not Performed 0.0 - 2.0 mmol/L Final     BASE DEFICIT   Date Value Ref Range Status   07/20/2010 0.6 0.0 - 2.0 mmol/L Final     BICARBONATE   Date Value Ref Range Status   07/20/2010 23.5 22.0 - 26.0 mmol/L Final     %FIO2   Date Value Ref Range Status   07/20/2010 97 % Final     Airway:* No LDAs found *  Blood pressure (!) 150/83, pulse 82, temperature 36.6 C (97.9 F), resp. rate 16, height 1.727 m (5\' 8" ), weight 134 kg (294 lb 5 oz), SpO2 98%.

## 2023-10-08 NOTE — Anesthesia Postprocedure Evaluation (Signed)
 Anesthesia Post Op Evaluation    Patient: [name]  Procedure(s):  INCISION AND DRAINAGE ABSCESS- POSTERIOR NECK    Last Vitals:Temperature: 36.6 C (97.9 F) (10/08/23 1344)  Heart Rate: 82 (10/08/23 1344)  BP (Non-Invasive): (!) 150/83 (10/08/23 1344)  Respiratory Rate: 16 (10/08/23 1344)  SpO2: 98 % (10/08/23 1344)    There were no known notable events for this encounter.    Patient is sufficiently recovered from the effects of anesthesia to participate in the evaluation and has returned to their pre-procedure level.  Patient location during evaluation: PACU       Patient participation: complete - patient participated  Level of consciousness: responsive to verbal stimuli and sleepy but conscious  Multimodal Pain Management: Multimodal analgesia used between 6 hours prior to anesthesia start to PACU discharge  Pain management: adequate  Airway patency: patent    Anesthetic complications: no  Cardiovascular status: acceptable and hemodynamically stable  Respiratory status: acceptable and face mask  Hydration status: acceptable  Patient post-procedure temperature: Pt Normothermic   PONV Status: Absent

## 2023-10-08 NOTE — Nurses Notes (Addendum)
 Received pt back to room 219 from PACU. Pt alert and oriented x4. Denies pain. Dressing on neck C/D/I. VSS on RA. Pt sitting on edge of bed sipping ice water at this time. Call light within reach. Pt encouraged to ring for assistance when getting out of bed.

## 2023-10-08 NOTE — Interval H&P Note (Cosign Needed)
 [facility]  H&P Update Form     [name], [name]  Date of Admission:10/08/2023  Date of Birth:[birth year]     10/08/2023            H & P updated the day of the procedure.  1. H&P completed within 30 days of surgical procedure and have been reviewed within 24 hours of the surgery, the patient has been examined, and no change has occurred in the patient's condition since the H&P was completed.          Change in medications: No         2. Patient continues to be appropriate candidate for planned surgical procedure. YES             Debera Lat, APRN,FNP-BC       Deirdre Peer, MD FACS

## 2023-10-08 NOTE — Nurses Notes (Signed)
 Patient arrived with CRNA and nurse at bedside. Breathing WDL has oral airway in place SFM on 10L and is asleep.        Starleen Blue, RN

## 2023-10-08 NOTE — Nurses Notes (Signed)
 PACU nurse redressed wound.        Starleen Blue, RN

## 2023-10-08 NOTE — Care Plan (Signed)
 Plan of care reviewed. Alert and oriented. VSS on RA. Patient sitting on the edge of the bed and resting in  in bed throughout shift. Pt to surgery today. Dressing CDI. IV ABX. No complaints of pain. Ambulates independently to restroom. BSFS monitored and managed with SSI. Remains free of falls and injuries throughout shift. No questions or concerns voiced at this time. Call bell and personal belongings within reach.     Problem: Adult Inpatient Plan of Care  Goal: Plan of Care Review  Outcome: Ongoing (see interventions/notes)  Goal: Patient-Specific Goal (Individualized)  Outcome: Ongoing (see interventions/notes)  Goal: Absence of Hospital-Acquired Illness or Injury  Outcome: Ongoing (see interventions/notes)  Intervention: Identify and Manage Fall Risk  Recent Flowsheet Documentation  Taken 10/08/2023 0945 by Broadus John, RN  Safety Promotion/Fall Prevention:   activity supervised   fall prevention program maintained   nonskid shoes/slippers when out of bed   safety round/check completed  Intervention: Prevent Skin Injury  Recent Flowsheet Documentation  Taken 10/08/2023 1740 by Broadus John, RN  Body Position: sitting  Taken 10/08/2023 1600 by Broadus John, RN  Body Position: supine, head elevated  Taken 10/08/2023 1400 by Broadus John, RN  Body Position: sitting  Taken 10/08/2023 1200 by Broadus John, RN  Body Position: sitting  Taken 10/08/2023 1100 by Broadus John, RN  Skin Protection:   adhesive use limited   incontinence pads utilized  Taken 10/08/2023 0945 by Broadus John, RN  Body Position: supine, head elevated  Skin Protection:   adhesive use limited   incontinence pads utilized  Taken 10/08/2023 0800 by Broadus John, RN  Body Position: supine, head elevated  Intervention: Prevent and Manage VTE (Venous Thromboembolism) Risk  Recent Flowsheet Documentation  Taken 10/08/2023 0945 by Broadus John, RN  VTE Prevention/Management: ambulation promoted  Goal: Optimal Comfort and Wellbeing  Outcome: Ongoing (see interventions/notes)  Intervention:  Provide Person-Centered Care  Recent Flowsheet Documentation  Taken 10/08/2023 0945 by Broadus John, RN  Trust Relationship/Rapport:   care explained   questions answered  Goal: Rounds/Family Conference  Outcome: Ongoing (see interventions/notes)

## 2023-10-08 NOTE — Nurses Notes (Signed)
Report given to med surge nurse and patient transported via wheelchair to room. Lizann Edelman, RN

## 2023-10-08 NOTE — Nurses Notes (Signed)
 Spoke with Dr. Marlane Mingle regarding giving 0900 medications following surgery. Dr. Marlane Mingle verbalized that it is ok to administer morning medications.

## 2023-10-08 NOTE — Pharmacy (Signed)
 [facility] / Department of Pharmaceutical Services  Therapeutic Drug Monitoring: Vancomycin  10/08/2023      Patient name: [name], [name]  Date of Birth:  Jan 04, 1961    Actual Weight:  Weight: 134 kg (294 lb 5 oz) (10/07/23 0528)     BMI:  BMI (Calculated): 44.84 (10/07/23 0528)      Date RPh Current regimen (including mg/kg) Indication &  Organism AUC or trough based dosing Target Levels^ SCr (mg/dL) CrCl* (mL/min) Measured level(s)   (mcg/mL) Plan & predicted AUC/trough if initial dosing (including when levels are due) Comments   02/16 AMB Vanc 1500 mg IV (15 mg/kg adjusted) q12h  SSTI AUC 400-600  1.10  Pt with ruptured abscess requires Vanc. Labs between 4th and 5th dose.  Projected AUC 552   2-17 MLD 1500mg  Q12h  Trough 12-15 0.81 >100  Trough due 2-18 @0230   Also receiving Rocephin                                                                         ^Target levels depends on dosing and monitoring method, AUC vs. trough based. For AUC based dosing units are mg*h/L. For trough based dosing units are mcg/mL.     *Creatinine clearance is estimated by using the Cockcroft-Gault equation for adult patients and the Brendolyn Patty for pediatric patients.    The decision to discontinue vancomycin therapy will be determined by the primary service.  Please contact the pharmacist with any questions regarding this patient's medication regimen.

## 2023-10-08 NOTE — Progress Notes (Signed)
 [facility]   Progress Note    [name]  Date of service: 10/08/2023  Date of Admission:  10/07/2023  Hospital Day:  LOS: 0 days     Antibiotic day 2., IV vancomycin and IV ceftriaxone      Subjective:     Patient continues to feel better than he did on admission.    His nasal congestion, postnasal drainage and cough seemed to be improving.      Patient is experiencing some discomfort at the abscess site on the right aspect of posterior neck .  He rates the discomfort a 2 out of scale of 10.    Patient is without new complaints and/or concerns.      Patient is eating and drinking well.  He is moving his bowels and urinating without difficulty.      Patient is scheduled to have surgical I&D of abscess later today in the OR by Dr. Mikeal Hawthorne.        Objective:      Vital Signs:  Vitals:    10/07/23 0528 10/07/23 1414 10/07/23 2120 10/08/23 0341   BP:  (!) 152/63 (!) 166/79 (!) 133/94   Pulse:  91 100 95   Resp:  20 16 20    Temp:  36.3 C (97.3 F) 36.7 C (98.1 F) 36.7 C (98.1 F)   SpO2:  90% 95% 99%   Weight: 134 kg (294 lb 5 oz)      Height:       BMI:              I/O:  I/O last 24 hours:    Intake/Output Summary (Last 24 hours) at 10/08/2023 0738  Last data filed at 10/08/2023 0425  Gross per 24 hour   Intake 2159 ml   Output 1000 ml   Net 1159 ml          amLODIPine (NORVASC) tablet, 5 mg, Oral, Daily  aspirin chewable tablet 81 mg, 81 mg, Oral, Daily  cefTRIAXone (ROCEPHIN) 2 g in iso-osmotic 50 mL premix IVPB, 2 g, Intravenous, Q24H  Correction/SSIP insulin lispro 100 units/mL injection, 1-5 Units, Subcutaneous, 4x/day AC  cyanocobalamin (VITAMIN B12) tablet, 1,000 mcg, Oral, Daily  D10W bolus infusion 250 mL, 250 mL, Intravenous, Q1H PRN  dextrose (GLUTOSE) 40% oral gel, 15 g, Oral, Q15 Min PRN  docusate sodium (COLACE) capsule, 100 mg, Oral, 2x/day  [Held by provider] enoxaparin (LOVENOX) 40 mg/0.4 mL SubQ injection, 40 mg, Subcutaneous, Q24H  [Held by provider] furosemide (LASIX) tablet,  40 mg, Oral, Daily  glucagon (GLUCAGEN) injection 1 mg, 1 mg, IntraMUSCULAR, Once PRN  lactobacillus rhamnosus (CULTURELLE) active cultures capsule, 1 Capsule, Oral, 2x/day-Food  [Held by provider] metFORMIN (GLUCOPHAGE) tablet, 1,000 mg, Oral, 2x/day  metoprolol tartrate (LOPRESSOR) tablet, 25 mg, Oral, Daily  multivitamin (THERA) tablet, 1 Tablet, Oral, Daily  NS 1000 mL with potassium chloride 20 mEq premix infusion, , Intravenous, Continuous  NS flush syringe, 5 mL, Intracatheter, 2x/day   And  NS flush syringe, 5 mL, Intracatheter, Q1 MIN PRN  nystatin (NYSTOP) 100,000 units/g topical powder, , Apply Topically, 2x/day  ondansetron (ZOFRAN) 2 mg/mL injection, 4 mg, Intravenous, Q6H PRN  sertraline (ZOLOFT) tablet, 100 mg, Oral, Daily  vancomycin (VANCOCIN) 1,000 mg solution ---Cabinet Override, , ,   vancomycin (VANCOCIN) 1,000 mg solution ---Cabinet Override, , ,   vancomycin (VANCOCIN) 1,500 mg in NS 500 mL IVPB, 15 mg/kg (Adjusted), Intravenous, Q12H  Vancomycin IV - Pharmacist to Dose per  Protocol, , Does not apply, Daily PRN        Physical Exam:    General/Constitutional:  Patient is awake and appears alert.  He is resting comfortably and appears to be no acute distress.  Patient is pleasant and cooperative with examination.  Cardiovascular:  There is no JVD, although, exam is difficult.  Heart sounds are distant but appear regular.  It is difficult to assess for murmurs, rubs and/or gallops secondary to distant heart sounds.  There is bilateral lower extremity lymphedema.  Pedal pulses are diminished bilaterally.  However, lower extremities appear warm and well perfused.  Pulmonary/Chest:  Patient is breathing comfortably on room air.  Gastrointestinal:  There is truncal obesity.  Bowel sounds are present.  Abdomen is soft and nontender.  It is difficult to assess for hepatosplenomegaly and/or masses secondary to body habitus.  GU:  Not examined.  Musculoskeletal:  Extremities are free of cyanosis.  Skin:   Skin exam is limited.  There is evidence tinea cruris.  There is brawny discoloration of bilateral pretibial regions as well.  There is a reddened and erythematous area over the right side of posterior neck with evidence of purulent drainage.        Labs:  Results for orders placed or performed during the hospital encounter of 10/07/23 (from the past 24 hours)   MRSA/MSSA COLONIZATION SCREEN, PCR    Specimen: Nasal Swab   Result Value Ref Range    STAPHYLOCOCCUS AUREUS Positive (A) Negative, Indeterminate    MRSA COLONIZATION SCREEN Negative Negative   BASIC METABOLIC PANEL - AM ONCE   Result Value Ref Range    SODIUM 138 136 - 145 mmol/L    POTASSIUM 3.4 (L) 3.5 - 5.1 mmol/L    CHLORIDE 103 96 - 111 mmol/L    CO2 TOTAL 26 23 - 31 mmol/L    ANION GAP 9 4 - 13 mmol/L    CALCIUM 8.1 (L) 8.6 - 10.3 mg/dL    GLUCOSE 161 (H) 65 - 125 mg/dL    BUN 12 8 - 25 mg/dL    CREATININE 0.96 0.45 - 1.35 mg/dL    BUN/CREA RATIO 15 6 - 22    ESTIMATED GFR - MALE >90 >=60 mL/min/BSA   EXTRA TUBES - STJ    Narrative    The following orders were created for panel order EXTRA TUBES - STJ.  Procedure                               Abnormality         Status                     ---------                               -----------         ------                     LAVENDER TOP [id number]]                                In process                   Please view results for these tests on the individual orders.   POC BLOOD  GLUCOSE (RESULTS)   Result Value Ref Range    GLUCOSE, POC 236 (H) 60 - 110 mg/dl   POC BLOOD GLUCOSE (RESULTS)   Result Value Ref Range    GLUCOSE, POC 321 (H) 60 - 110 mg/dl   POC BLOOD GLUCOSE (RESULTS)   Result Value Ref Range    GLUCOSE, POC 315 (H) 60 - 110 mg/dl   POC BLOOD GLUCOSE (RESULTS)   Result Value Ref Range    GLUCOSE, POC 243 (H) 60 - 110 mg/dl          Imaging:             Microbiology:  Hospital Encounter on 10/07/23 (from the past 96 hours)   ADULT ROUTINE BLOOD CULTURE, SET OF 2 ADULT BOTTLES  (BACTERIA AND YEAST)    Collection Time: 10/07/23  3:03 AM    Specimen: Blood   Culture Result Status    BLOOD CULTURE, ROUTINE No Growth 18-24 hrs. Preliminary   ADULT ROUTINE BLOOD CULTURE, SET OF 2 ADULT BOTTLES (BACTERIA AND YEAST)    Collection Time: 10/07/23  3:03 AM    Specimen: Blood   Culture Result Status    BLOOD CULTURE, ROUTINE No Growth 18-24 hrs. Preliminary   MRSA/MSSA COLONIZATION SCREEN, PCR    Collection Time: 10/07/23  2:52 PM    Specimen: Nasal Swab   Culture Result Status    STAPHYLOCOCCUS AUREUS Positive (A) Final    MRSA COLONIZATION SCREEN Negative Final           Assessment/Plan:   Active Hospital Problems    Diagnosis    Primary Problem: Cutaneous abscess of neck    CPAP (continuous positive airway pressure) dependence    HTN (hypertension)    Diabetes mellitus (CMS HCC)    Morbid obesity (CMS HCC)          MY ORDERS LAST 24 (24h ago, onward)       Start     Ordered    10/09/23 1400  VANCOMYCIN, TROUGH  ONE TIME         10/07/23 1423    10/09/23 0600  VANCOMYCIN PEAK  ONE TIME         10/07/23 1423    10/08/23 0900  docusate sodium (COLACE) capsule  2 TIMES DAILY         10/08/23 0636    10/08/23 0645  EXTRA TUBES - STJ  ONE TIME         10/08/23 0634    10/08/23 0645  LAVENDER TOP TUBE  PROCEDURE ONCE         10/08/23 0634    10/08/23 0615  POC BLOOD GLUCOSE (RESULTS)  PROCEDURE ONCE         10/08/23 0608    10/08/23 0530  BASIC METABOLIC PANEL - AM ONCE  0530 - AM DRAW (labs only)         10/07/23 1025    10/07/23 2245  POC BLOOD GLUCOSE (RESULTS)  PROCEDURE ONCE         10/07/23 2122    10/07/23 2100  nystatin (NYSTOP) 100,000 units/g topical powder  2 TIMES DAILY         10/07/23 1334    10/07/23 1700  lactobacillus rhamnosus (CULTURELLE) active cultures capsule  2 TIMES DAILY WITH FOOD         10/07/23 1119    10/07/23 1615  POC BLOOD GLUCOSE (RESULTS)  PROCEDURE ONCE         10/07/23 1558  10/07/23 1500  vancomycin (VANCOCIN) 1,500 mg in NS 500 mL IVPB  (vancomycin pharmacy to dose  & IVPB)  EVERY 12 HOURS         10/07/23 1342    10/07/23 1500  cefTRIAXone (ROCEPHIN) 2 g in iso-osmotic 50 mL premix IVPB  (cefTRIAXone (ROCEPHIN) IVPB)  EVERY 24 HOURS         10/07/23 1343    10/07/23 1430  trimethoprim-sulfamethoxazole (BACTRIM DS) 160-800mg  per tablet  2 TIMES DAILY,   Status:  Discontinued         10/07/23 1327    10/07/23 1400  MRSA/MSSA COLONIZATION SCREEN, PCR  ONE TIME         10/07/23 1400    10/07/23 1340  Vancomycin IV - Pharmacist to Dose per Protocol  (vancomycin pharmacy to dose & IVPB)  DAILY PRN         10/07/23 1342    10/07/23 1145  POC BLOOD GLUCOSE (RESULTS)  PROCEDURE ONCE         10/07/23 1128                  Problem list present on admission:    1. Posterior neck soft tissue infection with abscess and surrounding cellulitis:   Etiology  Requiring hospital admission for broad-spectrum IV antibiotics, surgical I&D of absence, supportive care as well as further evaluation and treatment  Requiring administration of probiotic while receiving antibiotic treatment  WBC count unremarkable on admission  ESR elevated at 62 on admission  C reactive protein elevated at 114.1 on admission  MRSA/ MSSA screen nares by PCR positive  Nonsterile superficial wound culture (of limited value) pending  Blood culture x2 pending  CT scan soft tissue neck (without IV contrast enhancement) dated 10/07/2023 showed (1) large area of cellulitis/ phlegmon as well as (2) tiny abscess as well as (3) no evidence of deep tissue involvement  General surgery consulted  Requiring consideration for further interventions pending repeat clinical assessment and test results    2. Hyperglycemia in poor controlled mellitus type 2:  Hemoglobin A1c 12.5 on 10/02/2018  Repeat hemoglobin A1c pending  Requiring close monitoring of blood sugars prior to meals and at bedtime, particularly, in light of presenting hyperglycemia in the setting of acute infectious illness  Requiring coverage of elevated blood sugars with an  aggressive correctional insulin sliding scale  Requiring consideration for further interventions pending repeat clinical assessment and test results    3. Obstructive sleep apnea requiring CPAP:  Encouraging patient to use home CPAP while in hospital    4. Obesity:  Obesity with obesity related health concerns  Requiring further evaluation and treatment by PCP and consultants as an outpatient    5. Bilateral lower extremity lymphedema:    6. Bilateral lower extremity venous stasis:    7. Medical noncompliance:    8. Deep vein thrombosis prophylaxis:    Patient appears to be at moderate high risk for VTE due to acute medical illness and relative immobility  Requiring VTE chemo-prophylaxis with subcu enoxaparin adjusted for age, weight and kidney functions        DVT/PE Prophylaxis: Enoxaparin        Disposition Planning:  Anticipate return to home.        Sena Hitch, MD    This note was partially generated using MModal Fluency Direct system, and there may be some incorrect words, spellings, and punctuation that were not noted in checking the note before saving

## 2023-10-08 NOTE — Anesthesia Preprocedure Evaluation (Signed)
 ANESTHESIA PRE-OP EVALUATION  Planned Procedure: INCISION AND DRAINAGE ABSCESS- POSTERIOR NECK (Neck)  Review of Systems     anesthesia history negative     patient summary reviewed  nursing notes reviewed        Pulmonary   asthma, sleep apnea, CPAP and rescue inhaler,   Cardiovascular    Hypertension, well controlled and hyperlipidemia ,No peripheral edema,        GI/Hepatic/Renal    GERD and well controlled        Endo/Other    morbid obesity,   type 2 diabetes/ poorly controlled/ controlled with oral medications    Neuro/Psych/MS   negative neuro/psych ROS,      Cancer    negative hematology/oncology ROS,                     Physical Assessment      Airway       Mallampati: III    TM distance: >3 FB    Neck ROM: limited  Mouth Opening: fair.  No Facial hair  No Beard  No endotracheal tube present  No Tracheostomy present    Dental       Dentition intact             Pulmonary    Breath sounds clear to auscultation  (-) no rhonchi, no decreased breath sounds, no wheezes, no rales and no stridor     Cardiovascular    Rhythm: regular  Rate: Normal  (-) no friction rub, carotid bruit is not present, no peripheral edema and no murmur     Other findings              Plan  ASA 3     Planned anesthesia type: general     general anesthesia with laryngeal mask airway      PONV Plan:  I plan to administer pharmcologic prophalaxis antiemetics  POV PLAN:   plan for postoperative opioid use    SLEEP APNEA  Patient is at risk of obstructive sleep apnea and Education provided regarding risk of obstructive sleep apnea        Intravenous induction     Anesthesia issues/risks discussed are: PONV, Difficult Airway, Dental Injuries, Eye /Visual Loss, Post-op Agitation/Tantrum, Stroke, Aspiration, Nerve Injuries, Post-op Cognitive Dysfunction, Cardiac Events/MI, Intraoperative Awareness/ Recall and Sore Throat.  Anesthetic plan and risks discussed with patient  signed consent obtained          Patient's NPO status is appropriate for  Anesthesia.           Plan discussed with CRNA and surgeon.    Noel Gerold, CRNA  )            I acknowledge and agree with the Anesthesia Plan of Care.     Rebbeca Paul, MD

## 2023-10-08 NOTE — Care Plan (Signed)
 Problem: Adult Inpatient Plan of Care  Goal: Rounds/Family Conference  Outcome: Ongoing (see interventions/notes)  Flowsheets (Taken 10/08/2023 0948)  Participants:   nursing   occupational therapy   pharmacy   physical therapy   physician   respiratory therapy   social work/services   speech language pathology   Antibiotic tx, IV fluids with potassium.  Has home CPAP.  Monitoring labs.  Surgery consult- plan OR today.  Discussed Antibiotic, VTE appropriateness, and evaluated completion of home medications.

## 2023-10-08 NOTE — Nurses Notes (Signed)
Report given to Erica in PACU.

## 2023-10-08 NOTE — Care Plan (Signed)
 Site on neck continues to have purulent drainage. Receiving Vanc and rocephin. Glucose monitored and SSI administered per orders. NS with potassium infusing at 100/hr. Patient is independent in room. Home CPAP on overnight. Plan of care reviewed. Call bell in reach.     Problem: Adult Inpatient Plan of Care  Goal: Plan of Care Review  Outcome: Ongoing (see interventions/notes)  Goal: Patient-Specific Goal (Individualized)  Outcome: Ongoing (see interventions/notes)  Goal: Absence of Hospital-Acquired Illness or Injury  Outcome: Ongoing (see interventions/notes)  Intervention: Identify and Manage Fall Risk  Recent Flowsheet Documentation  Taken 10/07/2023 2120 by Karie Fetch, RN  Safety Promotion/Fall Prevention: safety round/check completed  Intervention: Prevent Skin Injury  Recent Flowsheet Documentation  Taken 10/08/2023 0200 by Karie Fetch, RN  Body Position: side lying, left  Taken 10/08/2023 0000 by Karie Fetch, RN  Body Position: side lying, left  Taken 10/07/2023 2120 by Karie Fetch, RN  Body Position: supine, head elevated  Skin Protection: adhesive use limited  Goal: Optimal Comfort and Wellbeing  Outcome: Ongoing (see interventions/notes)  Goal: Rounds/Family Conference  Outcome: Ongoing (see interventions/notes)

## 2023-10-09 LAB — BASIC METABOLIC PANEL
ANION GAP: 15 mmol/L — ABNORMAL HIGH (ref 4–13)
BUN/CREA RATIO: 15 (ref 6–22)
BUN: 12 mg/dL (ref 8–25)
CALCIUM: 7.8 mg/dL — ABNORMAL LOW (ref 8.6–10.3)
CHLORIDE: 106 mmol/L (ref 96–111)
CO2 TOTAL: 14 mmol/L — ABNORMAL LOW (ref 23–31)
CREATININE: 0.79 mg/dL (ref 0.75–1.35)
ESTIMATED GFR - MALE: >90 mL/min/BSA (ref >=60)
GLUCOSE: 298 mg/dL — ABNORMAL HIGH (ref 65–125)
POTASSIUM: 4.6 mmol/L (ref 3.5–5.1)
SODIUM: 135 mmol/L — ABNORMAL LOW (ref 136–145)

## 2023-10-09 LAB — VANCOMYCIN, TROUGH: VANCOMYCIN TROUGH: 6.8 ug/mL — ABNORMAL LOW (ref 10.0–20.0)

## 2023-10-09 LAB — POC BLOOD GLUCOSE (RESULTS): GLUCOSE, POC: 246 mg/dL — ABNORMAL HIGH (ref 60–110)

## 2023-10-09 MED ORDER — ALBUTEROL SULFATE HFA 90 MCG/ACTUATION AEROSOL INHALER
1.0000 | INHALATION_SPRAY | Freq: Four times a day (QID) | RESPIRATORY_TRACT | Status: AC | PRN
Start: 2023-10-09 — End: ?

## 2023-10-09 MED ORDER — SULFAMETHOXAZOLE 800 MG-TRIMETHOPRIM 160 MG TABLET
1.0000 | ORAL_TABLET | Freq: Two times a day (BID) | ORAL | 0 refills | Status: AC
Start: 2023-10-09 — End: 2023-10-14

## 2023-10-09 MED ORDER — INSULIN GLARGINE-YFGN (U-100) 100 UNIT/ML SUBQ - CHARGE BY DOSE
20.0000 [IU] | Freq: Every day | SUBCUTANEOUS | Status: DC
Start: 2023-10-09 — End: 2023-10-09
  Administered 2023-10-09: 20 [IU] via SUBCUTANEOUS

## 2023-10-09 MED ORDER — ACETAMINOPHEN 325 MG TABLET
650.0000 mg | ORAL_TABLET | ORAL | Status: DC | PRN
Start: 2023-10-09 — End: 2023-10-09
  Administered 2023-10-09: 650 mg via ORAL
  Filled 2023-10-09: qty 2

## 2023-10-09 NOTE — Care Plan (Signed)
 Problem: Adult Inpatient Plan of Care  Goal: Plan of Care Review  Outcome: Ongoing (see interventions/notes)  Goal: Patient-Specific Goal (Individualized)  Outcome: Ongoing (see interventions/notes)  Flowsheets (Taken 10/08/2023 2000)  Individualized Care Needs: assist with needs  Anxieties, Fears or Concerns: none voiced  Goal: Absence of Hospital-Acquired Illness or Injury  Outcome: Ongoing (see interventions/notes)  Intervention: Identify and Manage Fall Risk  Recent Flowsheet Documentation  Taken 10/08/2023 2000 by Delrae Rend, RN  Safety Promotion/Fall Prevention:   activity supervised   safety round/check completed  Intervention: Prevent Skin Injury  Recent Flowsheet Documentation  Taken 10/08/2023 2000 by Delrae Rend, RN  Body Position: sitting  Skin Protection: adhesive use limited  Intervention: Prevent and Manage VTE (Venous Thromboembolism) Risk  Recent Flowsheet Documentation  Taken 10/08/2023 2000 by Delrae Rend, RN  VTE Prevention/Management: ambulation promoted  Goal: Optimal Comfort and Wellbeing  Outcome: Ongoing (see interventions/notes)  Intervention: Provide Person-Centered Care  Recent Flowsheet Documentation  Taken 10/08/2023 2000 by Delrae Rend, RN  Trust Relationship/Rapport:   care explained   choices provided   reassurance provided   questions encouraged  Goal: Rounds/Family Conference  Outcome: Ongoing (see interventions/notes)     Problem: Fall Injury Risk  Goal: Absence of Fall and Fall-Related Injury  Outcome: Ongoing (see interventions/notes)  Intervention: Identify and Manage Contributors  Recent Flowsheet Documentation  Taken 10/08/2023 2000 by Delrae Rend, RN  Self-Care Promotion:   BADL personal routines maintained   independence encouraged   BADL personal objects within reach  Medication Review/Management: medications reviewed  Intervention: Promote Injury-Free Environment  Recent Flowsheet Documentation  Taken 10/08/2023 2000 by Delrae Rend, RN  Safety Promotion/Fall Prevention:   activity supervised    safety round/check completed     Problem: Wound  Goal: Optimal Coping  Outcome: Ongoing (see interventions/notes)  Intervention: Support Patient and Family Response  Recent Flowsheet Documentation  Taken 10/08/2023 2000 by Delrae Rend, RN  Family/Support System Care: self-care encouraged  Supportive Measures: active listening utilized  Goal: Optimal Functional Ability  Outcome: Ongoing (see interventions/notes)  Intervention: Optimize Functional Ability  Recent Flowsheet Documentation  Taken 10/08/2023 2000 by Delrae Rend, RN  Activity Management: ROM, active encouraged  Activity Assistance Provided: independent  Goal: Absence of Infection Signs and Symptoms  Outcome: Ongoing (see interventions/notes)  Intervention: Prevent or Manage Infection  Recent Flowsheet Documentation  Taken 10/08/2023 2000 by Delrae Rend, RN  Fever Reduction/Comfort Measures:   lightweight clothing   lightweight bedding  Goal: Improved Oral Intake  Outcome: Ongoing (see interventions/notes)  Goal: Optimal Pain Control and Function  Outcome: Ongoing (see interventions/notes)  Intervention: Prevent or Manage Pain  Recent Flowsheet Documentation  Taken 10/08/2023 2000 by Delrae Rend, RN  Sleep/Rest Enhancement: awakenings minimized  Goal: Skin Health and Integrity  Outcome: Ongoing (see interventions/notes)  Intervention: Optimize Skin Protection  Recent Flowsheet Documentation  Taken 10/08/2023 2000 by Delrae Rend, RN  Pressure Reduction Techniques: Frequent weight shifting encouraged  Pressure Reduction Devices: Repositioning wedges/pillows utilized  Activity Management: ROM, active encouraged  Head of Bed (HOB) Positioning: HOB elevated  Goal: Optimal Wound Healing  Outcome: Ongoing (see interventions/notes)  Intervention: Promote Wound Healing  Recent Flowsheet Documentation  Taken 10/08/2023 2000 by Delrae Rend, RN  Pressure Reduction Techniques: Frequent weight shifting encouraged  Pressure Reduction Devices: Repositioning wedges/pillows utilized  Sleep/Rest  Enhancement: awakenings minimized  Activity Management: ROM, active encouraged

## 2023-10-09 NOTE — Discharge Summary (Signed)
 [facility]  DISCHARGE SUMMARY      PATIENT NAME:  [name], [name]  MRN:  [id number]  DOB:  04-12-1961    ENCOUNTER START DATE:  10/07/2023  INPATIENT ADMISSION DATE:   DISCHARGE DATE:  10/09/2023    ATTENDING PHYSICIAN: Dorann Ou, DO  SERVICE: [facility]  PRIMARY CARE PHYSICIAN: Myrtis Ser, NP     Reason for Admission       Diagnosis    Cutaneous abscess of neck [[id number]]            DISCHARGE DIAGNOSIS:   Same    Principal Problem:  Cutaneous abscess of neck    Active Hospital Problems    Diagnosis Date Noted    Principal Problem: Cutaneous abscess of neck [L02.11] 10/07/2023    CPAP (continuous positive airway pressure) dependence [Z99.89]     HTN (hypertension) [I10] 07/20/2010    Diabetes mellitus (CMS HCC) [E11.9] 07/20/2010    Morbid obesity (CMS HCC) [E66.01] 07/20/2010      Resolved Hospital Problems   No resolved problems to display.     Active Non-Hospital Problems    Diagnosis Date Noted    Cellulitis, scrotum 09/04/2011    Sleep apnea 09/04/2011    Chest pain 07/20/2010    Hyperlipidemia 07/20/2010    Sleep apnea 07/20/2010      Allergies   Allergen Reactions    Oxycodone (Bulk)      Other reaction(s): Mild hives    Tylox [Oxycodone-Acetaminophen] Hives/ Urticaria            DISCHARGE MEDICATIONS:     Current Discharge Medication List        START taking these medications.        Details   sulfamethoxazole-trimethoprim 800-160 mg tablet  Commonly known as: BACTRIM DS   160 mg, Oral, 2 TIMES DAILY  Qty: 10 Tablet  Refills: 0            CONTINUE these medications which have CHANGED during your visit.        Details   fluticasone propion-salmeteroL 250-50 mcg/dose oral diskus inhaler  Commonly known as: ADVAIR  What changed:   when to take this  reasons to take this   1 INHALATION , Inhalation, 2 TIMES DAILY  Qty: 3 Inhaler  Refills: 1     metoprolol tartrate 25 mg Tablet  Commonly known as: LOPRESSOR  What changed: when to take this   25 mg, Oral, EVERY 12 HOURS  Qty: 180 Tab  Refills: 1             CONTINUE these medications - NO CHANGES were made during your visit.        Details   albuterol sulfate 90 mcg/actuation oral inhaler  Commonly known as: ProAir HFA   1-2 Puffs, Inhalation, EVERY 6 HOURS PRN, Using seasonal  Refills: 0     amLODIPine 5 mg Tablet  Commonly known as: NORVASC   5 mg, DAILY  Refills: 0     Aspirin 81 mg Tablet   81 mg, DAILY  Refills: 0     Dexcom G7 Receiver Misc  Generic drug: Blood-Glucose Meter,Continuous   As instructed  Qty: 1 Each  Refills: 0     furosemide 40 mg Tablet  Commonly known as: LASIX   TAKE ONE TABLET BY MOUTH ONCE DAILY FOR 90 DAYS  Qty: 90 Tab  Refills: 1     HumaLOG KwikPen Insulin 100 unit/mL Insulin Pen  Generic drug: insulin  lispro   2 TIMES DAILY BEFORE MEALS  Refills: 0     insulin aspart U-100 100 unit/mL (3 mL) Insulin Pen  Commonly known as: NovoLOG Flexpen U-100 Insulin   PER SLIDING SCALE PROTOCOL AS DIRECTED (MAX DAILY DOSE UP TO 60 UNITS)  Qty: 60 mL  Refills: 1     insulin degludec 100 unit/mL (3 mL) Insulin Pen  Commonly known as: Evaristo Bury FlexTouch U-100   70 units bedtime  Qty: 75 mL  Refills: 1     MetFORMIN 1,000 mg Tablet  Commonly known as: GLUCOPHAGE   1,000 mg, 2 TIMES DAILY  Refills: 0     multivitamin Tablet   1 Tablet, DAILY  Refills: 0     naproxen 500 mg Tablet  Commonly known as: NAPROSYN   2 Tablets, DAILY  Refills: 0     Pen Needle (Disposable) 29 gauge x 1/2" Needle  Commonly known as: BD UF Original Pen Needle   USE TO INJECT INSULIN AS DIRECTED UP TO 5 TIMES DAILY  Qty: 500 Each  Refills: 1     potassium chloride 10 mEq Tablet Sustained Release  Commonly known as: KLOR-CON   TAKE ONE TABLET BY MOUTH ONCE DAILY WITH FOOD AND lasix  Refills: 0     rosuvastatin 20 mg Tablet  Commonly known as: CRESTOR   20 mg, Oral, EVERY EVENING  Qty: 90 Tab  Refills: 1     sertraline 100 mg Tablet  Commonly known as: ZOLOFT   100 mg, DAILY  Refills: 0     traZODone 150 mg Tablet  Commonly known as: DESYREL   take one-half TO one TABLET BY  MOUTH EVERY NIGHT (needs TO see connie williams AND schedule appointment WITH dr Jimmey Ralph)  Refills: 0     valsartan-hydroCHLOROthiazide 320-25 mg Tablet  Commonly known as: DIOVAN HCT   1 Tablet, Oral, DAILY  Qty: 90 Tab  Refills: 0     VITAMIN B-12 ORAL   DAILY  Refills: 0            STOP taking these medications.      cyclobenzaprine 10 mg Tablet  Commonly known as: FLEXERIL            Discharge med list refreshed?  YES        DISCHARGE INSTRUCTIONS:  Post-Discharge Follow Up Appointments       Friday Oct 12, 2023    Post-Operative Visit with Blenda Nicely, PA-C at  8:20 AM    Follow up with Long, Candace Gallus, MD FACS    Phone: [phone]    Where: [facility]      Tuesday Oct 23, 2023    Procedure with Jacqulyn Bath Candace Gallus, MD FACS at  2:45 PM      Monday Dec 17, 2023    Return Patient Visit with Nita Sickle, FNP-BC at  2:20 PM      Endocrinology, [facility], [place]  [address]  [place] [address]  [phone] General Surgery, [address]  [address], [place]  [address]  [place] [address]  [phone]          No discharge procedures on file.       REASON FOR HOSPITALIZATION AND HOSPITAL COURSE:  This is a 63 y.o. male with history of sleep apnea on CPAP, uncontrolled DM2, and hypertension who was seen in the ER for a chief complaint of neck abscess.  Patient reported abscess to the back of  his neck that started draining purulent fluid evening prior to presentation.  He was seen at outside clinic on 10/02/23 for sinus congestion, they noticed the abscess at that time and he was given cefdinir with outpatient General surgery referral.  He is unsure of how long the abscess has been there, possibly over a week.  He denied any shortness of breath, fevers/chills, or dizziness.  In ED, workup revealed potassium 3.3, glucose 536, CRP 114.1.  CT soft tissue neck obtained, showed large area of cellulitis/phlegmon containing a tiny abscess,  no evidence for deep tissue involvement.  Abscess was drained by ER provider, wound and blood cultures obtained.  Dr. Pearlean Brownie with General surgery consulted in the ER, recommended admission for IV antibiotics.  He received 15 units of regular insulin, 1 L NS bolus, and Unasyn while in the ER.  Patient was admitted to hospitalist service for further evaluation and management.  During admission, patient was taken to the OR on 10/08/23 and had OR I+D, cultures obtained.  Patient remained on Rocephin/Vanc, will transition to Bactrim at discharge.  Patient seen by surgery service and cleared for discharge home today.  Dry dressing changes ordered to neck daily and PRN.  He will f/u with surgery office on Friday.  He is to f/u with surgery office sooner or return to ER with concerns/changes.  Pain controlled with PRN Tylenol during admission.     CONDITION ON DISCHARGE:  A. Ambulation: Full ambulation  B. Self-care Ability: Complete  C. Cognitive Status Alert and Oriented x 3  D. DNR status at discharge: Full Code    Advance Directive Information      Flowsheet Row Most Recent Value   Does the Patient have an Advance Directive? No, Information Offered and Given            DISCHARGE DISPOSITION:  Home discharge  F/U with surgery office on Friday, 10/12/23.  Continue medications as outlined above. Take probiotic OTC while taking antibiotics.  Activity as tolerated.  Dry dressing changes to posterior neck daily and PRN.  1800 ADA diet.  Patient to f/u with surgery office sooner or return to ER with concerns/changes.      Dorann Ou, DO    Copies sent to Care Team         Relationship Specialty Notifications Start End    Myrtis Ser, NP PCP - General NURSE PRACTITIONER  10/07/21     Phone: [phone] Fax: [phone]         [address] [place] [address]            Referring providers can utilize [email or web address] to access their referred [facility] patient's information.

## 2023-10-09 NOTE — Progress Notes (Signed)
 [facility]  Hospitalist -- Progress Note  Name: [name]  Age: 63 y.o.   Gender: male  MRN: [id number]  Date of Admission:  10/07/2023  PCP: Myrtis Ser, NP  Attending: Dorann Ou, DO  Consultants: Surgery service      Subjective:  Patient seen in room.  No new overnight events per staff.  Patient is POD#1 I+D to neck.  He denied any acute complaints at time of exam.  Patient was seen by surgery service, dry dressing changes ordered for discharge.  Patient requesting discharge home.    Objective:  Filed Vitals:    10/08/23 1800 10/08/23 1830 10/08/23 2026 10/09/23 0415   BP: (!) 166/79 (!) 152/82 (!) 151/79 (!) 147/68   Pulse: 91 89 90 88   Resp:   18 16   Temp:   36.8 C (98.2 F) 36.5 C (97.7 F)   SpO2: 95% 95% 94% 96%       Physical Exam:  Today's Physical Exam:  Constitutional:  Patient is sitting up in bed, in no acute cardiopulmonary distress at time of exam  ENT:  ENMT without erythema or injection, mucous membranes moist, No oral lesions.   Neck:  no thyromegaly or lymphadenopathy and supple, symmetrical, trachea midline  Respiratory:  Clear to auscultation bilaterally.   Cardiovascular:  regular rate and rhythm  Gastrointestinal:  Soft, non-tender, Bowel sounds present  Musculoskeletal:  Head atraumatic and normocephalic  Integumentary:  Skin warm and dry, I+D area to posterior neck noted with packing removed, no significant drainage noted at time of exam.  Neurologic:  Alert and oriented x3  Psychiatric:  Normal affect, behavior, memory, thought content, judgement, and speech.    Results for orders placed or performed during the hospital encounter of 10/07/23 (from the past 24 hours)   BASIC METABOLIC PANEL - AM ONCE   Result Value Ref Range    SODIUM 135 (L) 136 - 145 mmol/L    POTASSIUM 4.6 3.5 - 5.1 mmol/L    CHLORIDE 106 96 - 111 mmol/L    CO2 TOTAL 14 (L) 23 - 31 mmol/L    ANION GAP 15 (H) 4 - 13 mmol/L    CALCIUM 7.8 (L) 8.6 - 10.3 mg/dL    GLUCOSE 454 (H) 65 - 125 mg/dL    BUN  12 8 - 25 mg/dL    CREATININE 0.98 1.19 - 1.35 mg/dL    BUN/CREA RATIO 15 6 - 22    ESTIMATED GFR - MALE >90 >=60 mL/min/BSA   VANCOMYCIN, TROUGH   Result Value Ref Range    VANCOMYCIN TROUGH 6.8 (L) 10.0 - 20.0 ug/mL    DOSE DATE 10/09/2023     DOSE TIME  3:00 AM    POC BLOOD GLUCOSE (RESULTS)   Result Value Ref Range    GLUCOSE, POC 215 (H) 60 - 110 mg/dl   POC BLOOD GLUCOSE (RESULTS)   Result Value Ref Range    GLUCOSE, POC 361 (H) 60 - 110 mg/dl   POC BLOOD GLUCOSE (RESULTS)   Result Value Ref Range    GLUCOSE, POC 339 (H) 60 - 110 mg/dl   POC BLOOD GLUCOSE (RESULTS)   Result Value Ref Range    GLUCOSE, POC 246 (H) 60 - 110 mg/dl         Assessment and Plan:  Cutaneous abscess of neck  Patient Active Problem List   Diagnosis    Chest pain    HTN (hypertension)    Diabetes  mellitus (CMS HCC)    Hyperlipidemia    Morbid obesity (CMS HCC)    Sleep apnea    Cellulitis, scrotum    Sleep apnea    Cutaneous abscess of neck    CPAP (continuous positive airway pressure) dependence     Posterior neck abscess - S/P I+D.  Patient seen by surgery service and cleared for discharge home today.  Will complete course of Bactrim at discharge.  Monitor culture results.  Dry dressing changes per surgery service with outpatient f/u.      Dorann Ou, DO

## 2023-10-09 NOTE — Progress Notes (Signed)
 [facility]  [address]  [place] [address]  [phone]  [phone]  Progress Note    ZIERE DOCKEN  16-Oct-1960  [id number]  10/09/2023    Subjective:  [name] is a 63 y.o. year old male admitted for an abscess of the back of the head.  He is s/p I and D done yesterday.  Today he is feeling much better.    Objective:  Vitals:    10/08/23 1800 10/08/23 1830 10/08/23 2026 10/09/23 0415   BP: (!) 166/79 (!) 152/82 (!) 151/79 (!) 147/68   Pulse: 91 89 90 88   Resp:   18 16   Temp:   36.8 C (98.2 F) 36.5 C (97.7 F)   SpO2: 95% 95% 94% 96%   Weight:       Height:       BMI:               General: Pleasant male in no acute distress  Skin: The packing was removed from the patient's wound.  There was a small amount of serosanguinous drainage.  No purulence today.  Much less red that it was yesterday.    CBC with Diff (Last 48 Hours):  No results for input(s): "WBC", "HGB", "HCT", "MCV", "PLTCNT", "BANDS", "PMNS", "LYMPHO", "LYMPHOCYTES", "MONOCYTES", "EOSINO", "EOSINOPHIL", "BASOPHILS" in the last 48 hours.    BMP (Last 48 Hours):    Recent Results in last 48 hours     10/08/23  0611 10/09/23  0236   SODIUM 138 135*   POTASSIUM 3.4* 4.6   CHLORIDE 103 106   CO2 26 14*   BUN 12 12   CREATININE 0.81 0.79   CALCIUM 8.1* 7.8*         Assessment:  S/p I and D of wound of the back of the head, doing well    Plan:  Discharge home today.  Patient lives in [place] so he wants to see if he can have dressing changes done at the clinic there.  Follow up in the surgery office in one week.    Switch to oral antibiotics.    Deirdre Peer, MD FACS

## 2023-10-09 NOTE — Nurses Notes (Signed)
 Pts daughter arrived. Pt ambulated to vehicle with nurse. All pt belongings taken home with pt.

## 2023-10-09 NOTE — Nurses Notes (Signed)
 Received order for d/c home.  AVS reviewed with patient.  A written copy of the AVS and discharge instructions was given to the patient. Pt aware that he is to pick up Bactrim @ [facility] in [place]. Questions sufficiently answered as needed. Pt awaiting ride home from his daughter.

## 2023-10-09 NOTE — Nurses Notes (Signed)
 Patient free of falls and patient ambulates independently. Medications reviewed with patient, glucose monitored, and coverage given per sliding scale. Posterior neck dressing intact. Patient denies any pain or discomfort at this time. Call bell within reach. Patient expresses no further concerns at this time.

## 2023-10-09 NOTE — OR Surgeon (Signed)
 OPERATIVE REPORT    Name: TREVEL DILLENBECK  MRN: [id number]  Date of Surgery: 10/08/23  Date of Birth: 1961-02-24    Preoperative diagnosis: Abscess of the back of the head    Postoperative diagnosis: Abscess of the back of the head    Operation: Incision and Drainage of Abscess of the back of the head    Surgeon: Deirdre Peer, MD FACS    Anesthesia: General    Estimated Blood Loss: Minimal    Specimen: See note below    Indications for Procedure: The patient is a 63 y.o. year old male who presented with an abscess of the back of the head    Findings:  Abscess of the back of the head     Description of Procedure: The patient was taken to the operating room and placed on the operating room table in the supine position.  Satisfactory general anesthesia was induced. The occipital area was prepped and draped in sterile fashion. An incision was made in the open part of the wound where it had been draining and purulent material was drained and cultured.  A counter incision was made further down in the fluctuant part of the abscess and another large pocket of pus was encountered.  The wound was irrigated with saline.   The wound was packed with Betadine-soaked gauze.  Sterile dressing was applied. The patient tolerated the procedure well and was brought to the recovery room in good condition.     Deirdre Peer, MD FACS

## 2023-10-09 NOTE — Care Plan (Signed)
 Problem: Adult Inpatient Plan of Care  Goal: Rounds/Family Conference  Outcome: Ongoing (see interventions/notes)  Flowsheets (Taken 10/09/2023 1037)  Participants:   nursing   occupational therapy   physical therapy   pharmacy   physician   respiratory therapy   social work/services   speech language pathology   IV Antibiotic tx, po probiotic.  Surgery consult.  Wound care.  Monitoring labs.  Podiatry consult.  Has home CPAP.   Discussed Antibiotic, VTE appropriateness, and evaluated completion of home medications.

## 2023-10-09 NOTE — Nurses Notes (Signed)
 Dr Edwin Dada in to see patient.

## 2023-10-09 NOTE — Nurses Notes (Signed)
 Dr Gennette Pac in to see patient.

## 2023-10-09 NOTE — Nurses Notes (Signed)
 Dr. Jacqulyn Bath removed pts surgical dressing from the back of the neck and instructed pt to take a shower and cleanse the area. Following shower, Dr. Jacqulyn Bath returned to assess abscess and advised to place a dressing over the area for d/c home. ABD pad applied and secured with tubular net bandage and paper tape.

## 2023-10-09 NOTE — Consults (Signed)
 [facility]  [address]  [place] [address]  [phone]  [phone]  Consult History and Physical    Name: [name]  MRN: [id number]  Date:   10/09/2023  Date of Birth: [birth year]  PCP: Myrtis Ser, NP    Reason for Consultation:  Abscess on the back of the head    Chief Complaint   Patient presents with    Cyst     Complaints of having a abscess on the back of right head that ruptured.        History of Present Illness: [name] is a 63 y.o. male who was admitted with an abscess of the back of the head.  He had seen his PCP and had been scheduled for an appointment in the surgery office but that appointment wasn't going to be for several weeks.  After that the area became redder and more tender so the patient came to the emergency department and was admitted from there.  He has been on IV antibiotics but the area has not gotten much better.      Past Medical/Surgical History:  Past Medical History:   Diagnosis Date    Ankle injury     Cellulitis 2013    CPAP (continuous positive airway pressure) dependence     Diabetes     Dizzy spells     bradycardia    Esophageal reflux     takes tums sometimes    HTN     Hyperlipidemia     Knee injury     Morbid obesity (CMS HCC)     Nerve damage     Sleep apnea     c pap; setting 3    Type 2 diabetes mellitus (CMS HCC)     dx'd 5-6 yrs ago    Wears glasses      Past Surgical History:   Procedure Laterality Date    COLONOSCOPY  11/22/2017    Dr Bryson Dames CATARACT REMOVAL Right     HX CATARACT REMOVAL Left 04/01/2019    Phacoemulsification of cataract left eye with IOL placement (04/01/2019).    HX HERNIA REPAIR      Bilateral    HX HERNIA REPAIR      HX HERNIA REPAIR      inguinal and umbilical repair     HX ORCHIOPEXY      HX OTHER      at age 76 to veins twisted in groin    HX TONSILLECTOMY      HX WISDOM TEETH EXTRACTION      KNEE ARTHROSCOPY Bilateral        Current Medications:  Medications Prior to Admission       Prescriptions     albuterol sulfate (PROAIR HFA) 90 mcg/actuation Inhalation HFA Aerosol Inhaler    Take 1-2 Puffs by inhalation Every 6 hours as needed    Patient taking differently:  Take 1-2 Puffs by inhalation Every 6 hours as needed Using seasonal    amLODIPine (NORVASC) 5 mg Oral Tablet    Take 1 Tablet (5 mg total) by mouth Once a day    Aspirin 81 mg Oral Tablet    take 81 mg by mouth Once a day.    Blood-Glucose Meter,Continuous (DEXCOM G7 RECEIVER) Does not apply Misc    As instructed    cyanocobalamin, vitamin B-12, (VITAMIN B-12 ORAL)    Take by mouth Once a day    cyclobenzaprine (FLEXERIL) 10  mg Oral Tablet    Take 0.5 Tablets (5 mg total) by mouth Once a day    Patient not taking:  Reported on 10/07/2023    fluticasone-salmeterol (ADVAIR) 250-50 mcg/dose Inhalation Disk with Device oral diskus inhaler    Take 1 INHALATION by inhalation Twice daily for 90 days    Patient taking differently:  Take 1 INHALATION  by inhalation Twice per day as needed    furosemide (LASIX) 40 mg Oral Tablet    TAKE ONE TABLET BY MOUTH ONCE DAILY FOR 90 DAYS    insulin aspart U-100 (NOVOLOG FLEXPEN U-100 INSULIN) 100 unit/mL (3 mL) Subcutaneous Insulin Pen    PER SLIDING SCALE PROTOCOL AS DIRECTED (MAX DAILY DOSE UP TO 60 UNITS)    insulin degludec (TRESIBA FLEXTOUCH U-100) 100 unit/mL (3 mL) Subcutaneous Insulin Pen    70 units bedtime    insulin lispro (HUMALOG KWIKPEN INSULIN) 100 unit/mL Subcutaneous Insulin Pen    Inject under the skin Twice a day before meals    Metformin (GLUCOPHAGE) 1,000 mg Oral Tablet    Take 1 Tablet (1,000 mg total) by mouth Twice daily    metoprolol tartrate (LOPRESSOR) 25 mg Oral Tablet    Take 1 Tab (25 mg total) by mouth Every 12 hours for 90 days    Patient taking differently:  Take 1 Tablet (25 mg total) by mouth Once a day    multivitamin Oral Tablet    Take 1 Tablet by mouth Once a day    naproxen (NAPROSYN) 500 mg Oral Tablet    Take 2 Tablets (1,000 mg total) by mouth Once a day    Pen Needle,  Disposable, (BD UF ORIGINAL PEN NEEDLE) 29 gauge x 1/2" Needle    USE TO INJECT INSULIN AS DIRECTED UP TO 5 TIMES DAILY    potassium chloride (KLOR-CON) 10 mEq Oral Tablet Sustained Release    TAKE ONE TABLET BY MOUTH ONCE DAILY WITH FOOD AND lasix    rosuvastatin (CRESTOR) 20 mg Oral Tablet    Take 1 Tab (20 mg total) by mouth Every evening for 90 days    sertraline (ZOLOFT) 100 mg Oral Tablet    Take 1 Tablet (100 mg total) by mouth Once a day    traZODone (DESYREL) 150 mg Oral Tablet    take one-half TO one TABLET BY MOUTH EVERY NIGHT (needs TO see connie williams AND schedule appointment WITH dr Jimmey Ralph)    valsartan-hydroCHLOROthiazide (DIOVAN HCT) 320-25 mg Oral Tablet    Take 1 Tab by mouth Once a day for 90 days            Allergies:  Allergies   Allergen Reactions    Acetaminophen      Other reaction(s): Mild hives    Oxycodone (Bulk)      Other reaction(s): Mild hives    Tylox [Oxycodone-Acetaminophen] Hives/ Urticaria       Social History:  Social History     Socioeconomic History    Marital status: Married     Spouse name: CAROLYN    Number of children: 1   Occupational History     Employer: [employer]   Tobacco Use    Smoking status: Never    Smokeless tobacco: Never   Vaping Use    Vaping status: Never Used   Substance and Sexual Activity    Alcohol use: No    Drug use: No   Other Topics Concern    Right hand dominant Yes  Drives Yes    Seat Belt Yes    Ability to Walk 1 Flight of Steps without SOB/CP Yes    Routine Exercise No    Ability to Walk 2 Flight of Steps without SOB/CP Yes    Ability To Do Own ADL's Yes    Other Activity Level Yes   Social History Narrative    ** Merged History Encounter **          Social Determinants of Health     Social Connections: Low Risk  (10/07/2023)    Social Connections     SDOH Social Isolation: 5 or more times a week       Family History:  Family Medical History:       Problem Relation (Age of Onset)    Cancer Mother    Congestive Heart Failure  Maternal Grandmother    Diabetes Paternal Uncle            Review of Systems:  Constitutional: negative for fevers, chills and anorexia  Respiratory: negative for cough, sputum or hemoptysis  Cardiovascular: negative for chest pain, dyspnea and palpitations  Gastrointestinal: negative for nausea, vomiting, change in bowel habits, melena, diarrhea, constipation and abdominal pain  Genitourinary:negative for frequency and dysuria  Integument/breast: negative for rash and skin lesion(s)  Musculoskeletal:negative for back pain and muscle weakness    Labs    CBC Results Differential Results   No results found for this or any previous visit (from the past 30 hours). No results found for this or any previous visit (from the past 30 hours).     BMP Results Other Chemistries Results   Results for orders placed or performed during the hospital encounter of 10/07/23 (from the past 30 hours)   BASIC METABOLIC PANEL - AM ONCE    Collection Time: 10/09/23  2:36 AM   Result Value    SODIUM 135 (L)    POTASSIUM 4.6    CHLORIDE 106    CO2 TOTAL 14 (L)    BUN 12    CREATININE 0.79    No results found for this or any previous visit (from the past 30 hours).     Liver/Pancreas Enzyme Results Liver Function Results   No results found for this or any previous visit (from the past 30 hours). No results found for this or any previous visit (from the past 30 hours).     Cardiac Results Coags Results   No results found for this or any previous visit (from the past 30 hours). No results found for this or any previous visit (from the past 30 hours).               Physical Exam:   Filed Vitals:    10/08/23 1800 10/08/23 1830 10/08/23 2026 10/09/23 0415   BP: (!) 166/79 (!) 152/82 (!) 151/79 (!) 147/68   Pulse: 91 89 90 88   Resp:   18 16   Temp:   36.8 C (98.2 F) 36.5 C (97.7 F)   SpO2: 95% 95% 94% 96%      HEENT: PERRL, EOMI,Heart: RRR  Lungs: normal respiratory effort.  Skin: Patient has a large abscess on the back of his head.  There is  a small opening at the top that is draining purulent material    Assessment:  63 y.o. male with a large abscess on the back of his head    Plan:  I and D in the operating room today.  Risks vs. Benefits of surgery (as outlined in the consent form that was reviewed with the patient and signed by the patient or their representative) have been explained to the patient including effect of patient's comorbidities noted above on increasing the risk of the intended procedure and he understands and wishes to proceed. Also discussed with the patient the likelihood of the patient achieving his goals, as well as potential problems, including problems associated with the above comorbidities, that may occur during recuperation and may complicate recovery.         Deirdre Peer, MD FACS

## 2023-10-09 NOTE — Ancillary Notes (Signed)
 Discharge planning with pt. Pt lives with his wife and is typically independent. Pt has no reported problems with transportation, obtaining medications, food or housing insecurity, or substance abuse. Pt states that he has depression, but that he has seen a therapist and psychiatrist and that this is controlled with medication currently.  Pt has a CPAP from [employer].  Pt denies DME needs. Pt feels he is ok with his current dressing with assistance from his daughter.  Pt declines home health or rehab referrals.  Pt anticipates no d/c needs.

## 2023-10-10 ENCOUNTER — Other Ambulatory Visit (INDEPENDENT_AMBULATORY_CARE_PROVIDER_SITE_OTHER): Payer: Self-pay

## 2023-10-10 ENCOUNTER — Encounter (INDEPENDENT_AMBULATORY_CARE_PROVIDER_SITE_OTHER): Payer: Self-pay

## 2023-10-10 DIAGNOSIS — Z7189 Other specified counseling: Secondary | ICD-10-CM

## 2023-10-10 LAB — WOUND, SUPERFICIAL/NON-STERILE SITE, AEROBIC CULTURE AND GRAM STAIN

## 2023-10-10 NOTE — Nursing Note (Signed)
 Transition of Care Contact Information  Discharge Date: 10/09/2023  Transition Facility Type--Hospital (Inpatient or Observation)  Facility Name--[facility]  Interactive Contact(s): Completed or attempted contact indicated by Date/Time  First Attempt Call: 10/10/2023 11:59 AM  Second Attempted Contact: 10/10/2023 12:00 PM  Contact Method(s)-- Patient/Caregiver Telephone, MyChart Patient Portal  Clinical Staff Name/Role who contacted--Romana Deaton RN  Transition Note:CTC attempted outreach via telephone for hospital discharge follow up with no avail. Voice mail left identifying myself as well as reason for call and my contact information. A MyChart message was also sent to patient with reasoning for outreach along with my contact information and the Nurse Navigator triage line telephone number.   Will attempt final outreach attempt next business day.  Rebeca Allegra, RN 10/10/2023 12:02

## 2023-10-11 LAB — POC BLOOD GLUCOSE (RESULTS): GLUCOSE, POC: 219 mg/dL — ABNORMAL HIGH (ref 60–110)

## 2023-10-11 NOTE — Addendum Note (Signed)
 Addended by: Fredonia Highland on: 10/11/2023 03:52 PM     Modules accepted: Orders

## 2023-10-11 NOTE — Nursing Note (Signed)
 Transition of Care Contact Information  Discharge Date: 10/09/2023  Transition Facility Type--Hospital (Inpatient or Observation)  Facility Name--[facility]  Interactive Contact(s): Completed or attempted contact indicated by Date/Time  First Attempt Call: 10/10/2023 11:59 AM  Second Attempted Contact: 10/10/2023 12:00 PM  Third Attempted Contact: 10/11/2023  3:36 PM  Contact Method(s)-- Patient/Caregiver Telephone, MyChart Patient Portal  Clinical Staff Name/Role who contacted--Michell Giuliano RN  Transition Note:CTC attempted outreach via telephone for hospital discharge follow up with no avail. Voice mail left identifying myself as well as reason for call and my contact information. A MyChart message was also sent to patient with reasoning for outreach along with my contact information and the Nurse Navigator triage line telephone number.   Will attempt final outreach attempt next business day.  Rebeca Allegra, RN 10/10/2023 12:02 CTC outbound call to pt for third and final attempt for TOC. No Answer. CTC closing TOC program at this time but is making a referral for [facility]  CCM and RPH Referral for diabetes management.  Pt recently admitted for I&D of abscess on his neck.                  HA1C 12.4 (H) 10/07/2023       Rebeca Allegra, RN  10/11/2023 15:50

## 2023-10-12 ENCOUNTER — Encounter (INDEPENDENT_AMBULATORY_CARE_PROVIDER_SITE_OTHER): Payer: Self-pay | Admitting: Physician Assistant

## 2023-10-12 ENCOUNTER — Other Ambulatory Visit: Payer: Self-pay

## 2023-10-12 ENCOUNTER — Other Ambulatory Visit (INDEPENDENT_AMBULATORY_CARE_PROVIDER_SITE_OTHER): Payer: Self-pay

## 2023-10-12 ENCOUNTER — Ambulatory Visit: Payer: No Typology Code available for payment source | Attending: Physician Assistant | Admitting: Physician Assistant

## 2023-10-12 ENCOUNTER — Encounter (INDEPENDENT_AMBULATORY_CARE_PROVIDER_SITE_OTHER): Payer: Self-pay

## 2023-10-12 VITALS — BP 138/72 | HR 96 | Temp 97.0°F | Resp 18 | Ht 68.0 in | Wt 295.1 lb

## 2023-10-12 DIAGNOSIS — L02811 Cutaneous abscess of head [any part, except face]: Secondary | ICD-10-CM

## 2023-10-12 DIAGNOSIS — Z5189 Encounter for other specified aftercare: Secondary | ICD-10-CM | POA: Insufficient documentation

## 2023-10-12 DIAGNOSIS — Z9889 Other specified postprocedural states: Secondary | ICD-10-CM | POA: Insufficient documentation

## 2023-10-12 DIAGNOSIS — Z7189 Other specified counseling: Secondary | ICD-10-CM

## 2023-10-12 LAB — ADULT ROUTINE BLOOD CULTURE, SET OF 2 BOTTLES (BACTERIA AND YEAST)
BLOOD CULTURE, ROUTINE: NO GROWTH
BLOOD CULTURE, ROUTINE: NO GROWTH

## 2023-10-12 MED ORDER — CLINDAMYCIN HCL 300 MG CAPSULE
300.0000 mg | ORAL_CAPSULE | Freq: Four times a day (QID) | ORAL | 0 refills | Status: AC
Start: 2023-10-12 — End: 2023-10-19

## 2023-10-12 NOTE — Progress Notes (Deleted)
295.1

## 2023-10-12 NOTE — Progress Notes (Signed)
 [facility]/General Surgery   Progress Note  [name], [name]  MRN: [id number]  Date of Admission:  10/12/2023  Date of Birth:  [birth year]      PCP: Myrtis Ser, NP  Chief Complaint:  Wound check       HPI: [name] is a 63 y.o. white male presents to the clinic for wound check. Patient is s/p I&D of abscess on the back of the head on 10/08/23 by Dr. Jacqulyn Bath. He was in the hospital from 10/07/23 to 10/09/23 and received Rocephin/Vanc. Discharged on Bactrim DS. Wound cultures still in process. Blood cultures negative. He reports doing well. He has been washing the wound twice daily and keeping it covered. Reports itching but no pain, fever, or chills.      Past Medical History:   Diagnosis Date    Ankle injury     Cellulitis 2013    CPAP (continuous positive airway pressure) dependence     Diabetes     Dizzy spells     bradycardia    Esophageal reflux     takes tums sometimes    HTN     Hyperlipidemia     Knee injury     Morbid obesity (CMS HCC)     Nerve damage     Sleep apnea     c pap; setting 3    Type 2 diabetes mellitus (CMS HCC)     dx'd 5-6 yrs ago    Wears glasses          Past Surgical History:   Procedure Laterality Date    COLONOSCOPY  11/22/2017    Dr Bryson Dames CATARACT REMOVAL Right     HX CATARACT REMOVAL Left 04/01/2019    Phacoemulsification of cataract left eye with IOL placement (04/01/2019).    HX HERNIA REPAIR      Bilateral    HX HERNIA REPAIR      HX HERNIA REPAIR      inguinal and umbilical repair     HX ORCHIOPEXY      HX OTHER      at age 32 to veins twisted in groin    HX TONSILLECTOMY      HX WISDOM TEETH EXTRACTION      KNEE ARTHROSCOPY Bilateral            Current Outpatient Medications:     albuterol sulfate (PROAIR HFA) 90 mcg/actuation Inhalation oral inhaler, Take 1-2 Puffs by inhalation Every 6 hours as needed Using seasonal, Disp: , Rfl:     amLODIPine (NORVASC) 5 mg Oral Tablet, Take 1 Tablet (5 mg total) by mouth Once a day, Disp: , Rfl:     ascorbic acid, vitamin  C, (VITAMIN C) 500 mg Oral Tablet, Take 1 Tablet (500 mg total) by mouth Once a day, Disp: , Rfl:     Aspirin 81 mg Oral Tablet, take 81 mg by mouth Once a day., Disp: , Rfl:     Blood-Glucose Meter,Continuous (DEXCOM G7 RECEIVER) Does not apply Misc, As instructed, Disp: 1 Each, Rfl: 0    buPROPion (WELLBUTRIN SR) 100 mg Oral tablet sustained-release 12 hr, Take 1 Tablet (100 mg total) by mouth Once a day, Disp: , Rfl:     calcium citrate-vitamin D3 (CITRACAL) 200 mg-6.25 mcg (250 unit) Oral Tablet, Take by mouth Once a day, Disp: , Rfl:     clindamycin (CLEOCIN) 300 mg Oral Capsule, Take 1 Capsule (300 mg total) by mouth Four times  a day for 7 days, Disp: 28 Capsule, Rfl: 0    cyanocobalamin, vitamin B-12, (VITAMIN B-12 ORAL), Take by mouth Once a day, Disp: , Rfl:     fluticasone propionate (FLONASE) 50 mcg/actuation Nasal Spray, Suspension, Administer 2 Sprays into each nostril Once a day, Disp: , Rfl:     fluticasone-salmeterol (ADVAIR) 250-50 mcg/dose Inhalation Disk with Device oral diskus inhaler, Take 1 INHALATION by inhalation Twice daily for 90 days (Patient taking differently: Take 1 INHALATION  by inhalation Twice per day as needed), Disp: 3 Inhaler, Rfl: 1    furosemide (LASIX) 40 mg Oral Tablet, TAKE ONE TABLET BY MOUTH ONCE DAILY FOR 90 DAYS, Disp: 90 Tab, Rfl: 1    insulin aspart U-100 (NOVOLOG FLEXPEN U-100 INSULIN) 100 unit/mL (3 mL) Subcutaneous Insulin Pen, PER SLIDING SCALE PROTOCOL AS DIRECTED (MAX DAILY DOSE UP TO 60 UNITS), Disp: 60 mL, Rfl: 1    insulin degludec (TRESIBA FLEXTOUCH U-100) 100 unit/mL (3 mL) Subcutaneous Insulin Pen, 70 units bedtime, Disp: 75 mL, Rfl: 1    Metformin (GLUCOPHAGE) 1,000 mg Oral Tablet, Take 1 Tablet (1,000 mg total) by mouth Twice daily, Disp: , Rfl:     metoprolol tartrate (LOPRESSOR) 25 mg Oral Tablet, Take 1 Tab (25 mg total) by mouth Every 12 hours for 90 days, Disp: 180 Tab, Rfl: 1    multivitamin Oral Tablet, Take 1 Tablet by mouth Once a day, Disp: ,  Rfl:     naproxen (NAPROSYN) 500 mg Oral Tablet, Take 2 Tablets (1,000 mg total) by mouth Once a day, Disp: , Rfl:     Pen Needle, Disposable, (BD UF ORIGINAL PEN NEEDLE) 29 gauge x 1/2" Needle, USE TO INJECT INSULIN AS DIRECTED UP TO 5 TIMES DAILY, Disp: 500 Each, Rfl: 1    potassium chloride (KLOR-CON) 10 mEq Oral Tablet Sustained Release, TAKE ONE TABLET BY MOUTH ONCE DAILY WITH FOOD AND lasix, Disp: , Rfl:     sertraline (ZOLOFT) 100 mg Oral Tablet, Take 1 Tablet (100 mg total) by mouth Once a day, Disp: , Rfl:     traZODone (DESYREL) 150 mg Oral Tablet, take one-half TO one TABLET BY MOUTH EVERY NIGHT (needs TO see connie williams AND schedule appointment WITH dr Jimmey Ralph) (Patient not taking: Reported on 10/12/2023), Disp: , Rfl:     trimethoprim-sulfamethoxazole (BACTRIM DS) 160-800mg  per tablet, Take 1 Tablet (160 mg total) by mouth Twice daily for 5 days, Disp: 10 Tablet, Rfl: 0    valsartan-hydroCHLOROthiazide (DIOVAN HCT) 320-25 mg Oral Tablet, Take 1 Tab by mouth Once a day for 90 days, Disp: 90 Tab, Rfl: 0  Allergies   Allergen Reactions    Oxycodone (Bulk)      Other reaction(s): Mild hives    Tylox [Oxycodone-Acetaminophen] Hives/ Urticaria     Social History     Tobacco Use    Smoking status: Never    Smokeless tobacco: Never   Substance Use Topics    Alcohol use: No     Family History:     Family Medical History:       Problem Relation (Age of Onset)    Cancer Mother    Congestive Heart Failure Maternal Grandmother    Diabetes Paternal Uncle            ROS: Other than ROS in the HPI, all other systems were negative.    EXAM:  BP 138/72   Pulse 96   Temp 36.1 C (97 F)   Resp 18  Ht 1.727 m (5\' 8" )   Wt 134 kg (295 lb 1.6 oz)   SpO2 97%   BMI 44.87 kg/m       General: Pleasant male in no acute distress.  HEENT: PERRL, EOMI, no visible neck masses.  Heart: RRR.  Lungs: Normal respiratory effort. Clear to auscultation bilaterally.  Abdomen: Soft, nontender, nondistended.  Skin: Dressing removed  from right posterior head. Two I&D sites noted that connect upon probing. Wound irrigated with normal saline. Erythema still present surrounding I&D sites. Clean, dry dressing applied.  Extremities: Patient appears able to walk without difficulty. Hands show no clubbing or cyanosis. No edema.      Assessment: 63 y.o. male s/p I&D abscess back of the head, doing well.    Plan:     1. Visit for wound check (Primary)  Discontinue Bactrim; Clindamycin Rx sent to [facility].  Continue washing wounds daily.  Keep covered in between.  Follow-up in two weeks for wound check.  Return sooner with any concerns or worsening s/s.    2. Status post incision and drainage  See above.      Blenda Nicely, PA-C

## 2023-10-12 NOTE — Nursing Note (Signed)
 Patient is a 63 y.o. male presenting for post op abscess I&D 10/09/2023. Patient denies any other issues.    Edwin Dada, CCMA  10/12/2023, 08:49

## 2023-10-12 NOTE — Progress Notes (Signed)
 Care Coordination Identified Note    DATE: 10/12/2023, 08:35  PATIENT NAME: [name]  MRN: [id number]  DOB: 20-Nov-1960  PCP: Myrtis Ser, NP  INSURANCE: Payor: [employer] / Plan: [employer] / Product Type: PPO /     Received referral for Disease Management from CTC, Misty Garlow. The patient has been marked as identified at this time for potential enrollment. Chart review completed.  A future outgoing call will be made to patient within 48 hours to discuss Care Coordination and obtain verbal consent if interested.     Darlys Gales, [facility]  10/12/2023  08:35

## 2023-10-13 LAB — WOUND, SUPERFICIAL/NON-STERILE SITE, AEROBIC CULTURE AND GRAM STAIN

## 2023-10-14 LAB — ANAEROBIC CULTURE

## 2023-10-16 ENCOUNTER — Other Ambulatory Visit (INDEPENDENT_AMBULATORY_CARE_PROVIDER_SITE_OTHER): Payer: Self-pay

## 2023-10-16 DIAGNOSIS — Z7189 Other specified counseling: Secondary | ICD-10-CM

## 2023-10-16 NOTE — Progress Notes (Signed)
 Diabetes Outreach-attempt 1    I reviewed patients chart and left a VM, with my call back information.  I will try again in a couple days to see if he would like to enroll in diabetes disease management program.      Active Diabetes Meds   Medication Sig    Blood-Glucose Meter,Continuous (DEXCOM G7 RECEIVER) Does not apply Misc As instructed    insulin aspart U-100 (NOVOLOG FLEXPEN U-100 INSULIN) 100 unit/mL (3 mL) Subcutaneous Insulin Pen PER SLIDING SCALE PROTOCOL AS DIRECTED (MAX DAILY DOSE UP TO 60 UNITS)    insulin degludec (TRESIBA FLEXTOUCH U-100) 100 unit/mL (3 mL) Subcutaneous Insulin Pen 70 units bedtime    Metformin (GLUCOPHAGE) 1,000 mg Oral Tablet Take 1 Tablet (1,000 mg total) by mouth Twice daily       Diabetes Monitors  A1C: 12.4  A1C Date: 10/07/2023  Kidney Health:   Urine Microalbumin/Cr Ratio Ur Microalb/Cr Ratio date   eGFR --99 eGFR date--10/09/2023      Retinal Exam Date: 01/04/2022 retinopathy status not documented  Last Foot Exam: Not Found      Merideth Abbey, [place]  [phone]

## 2023-10-19 ENCOUNTER — Other Ambulatory Visit (INDEPENDENT_AMBULATORY_CARE_PROVIDER_SITE_OTHER): Payer: Self-pay

## 2023-10-19 DIAGNOSIS — Z7189 Other specified counseling: Secondary | ICD-10-CM

## 2023-10-19 NOTE — Progress Notes (Signed)
 Diabetes Outreach-attempt 2    I reviewed patients chart and left a VM with my call back information, I will send a MyChart message with information regarding the diabetes disease management program.  At this time I am closing outreach and will wait for patient to call.         Active Diabetes Meds   Medication Sig    Blood-Glucose Meter,Continuous (DEXCOM G7 RECEIVER) Does not apply Misc As instructed    insulin aspart U-100 (NOVOLOG FLEXPEN U-100 INSULIN) 100 unit/mL (3 mL) Subcutaneous Insulin Pen PER SLIDING SCALE PROTOCOL AS DIRECTED (MAX DAILY DOSE UP TO 60 UNITS)    insulin degludec (TRESIBA FLEXTOUCH U-100) 100 unit/mL (3 mL) Subcutaneous Insulin Pen 70 units bedtime    Metformin (GLUCOPHAGE) 1,000 mg Oral Tablet Take 1 Tablet (1,000 mg total) by mouth Twice daily       Diabetes Monitors  A1C: 12.4  A1C Date: 10/07/2023  Kidney Health:   Urine Microalbumin/Cr Ratio Ur Microalb/Cr Ratio date   eGFR --99 eGFR date--10/09/2023      Retinal Exam Date: 01/04/2022 retinopathy status not documented  Last Foot Exam: Not Found      Merideth Abbey, [place]  [phone]

## 2023-10-23 ENCOUNTER — Encounter (INDEPENDENT_AMBULATORY_CARE_PROVIDER_SITE_OTHER): Payer: Self-pay

## 2023-10-23 ENCOUNTER — Ambulatory Visit (INDEPENDENT_AMBULATORY_CARE_PROVIDER_SITE_OTHER): Payer: Self-pay | Admitting: Surgery

## 2023-10-26 ENCOUNTER — Ambulatory Visit (INDEPENDENT_AMBULATORY_CARE_PROVIDER_SITE_OTHER): Payer: Self-pay | Admitting: Physician Assistant

## 2023-10-29 ENCOUNTER — Other Ambulatory Visit: Payer: Self-pay

## 2023-10-29 ENCOUNTER — Encounter (INDEPENDENT_AMBULATORY_CARE_PROVIDER_SITE_OTHER): Payer: Self-pay | Admitting: Physician Assistant

## 2023-10-29 ENCOUNTER — Ambulatory Visit: Payer: Self-pay | Attending: Physician Assistant | Admitting: Physician Assistant

## 2023-10-29 VITALS — BP 158/86 | HR 89 | Temp 97.3°F | Resp 18 | Ht 68.0 in | Wt 305.0 lb

## 2023-10-29 DIAGNOSIS — Z5189 Encounter for other specified aftercare: Secondary | ICD-10-CM | POA: Insufficient documentation

## 2023-10-29 DIAGNOSIS — Z9889 Other specified postprocedural states: Secondary | ICD-10-CM | POA: Insufficient documentation

## 2023-10-29 NOTE — Nursing Note (Signed)
 Patient is a 63 year old male presenting in the clinic for a wound check. Voiced no concerns and denies pain and discomfort. Tommie Ard, LPN

## 2023-10-29 NOTE — Progress Notes (Signed)
 [facility]/General Surgery   Progress Note  Jeorge, Reister  MRN: [id number]  Date of Admission:  10/29/2023  Date of Birth:  [birth year]      PCP: Myrtis Ser, NP  Chief Complaint:  Wound check       HPI: [name] is a 63 y.o. white male presents to the clinic for wound check. Patient is I&D of abscess on the back of the head on 10/08/23 by Dr. Jacqulyn Bath. He was in the hospital from 10/07/23 to 10/09/23 and received Rocephin/Vanc. Discharged on Bactrim DS. Antibiotics were switched to Clindamycin on 10/12/2023 which he has since completed. Wound cultures revealed staph aureus and strep agalactiae. Blood cultures negative. Patient reports doing well. Denies pain, drainage, fever, or chills.       Past Medical History:   Diagnosis Date    Ankle injury     Cellulitis 2013    CPAP (continuous positive airway pressure) dependence     Diabetes     Dizzy spells     bradycardia    Esophageal reflux     takes tums sometimes    HTN     Hyperlipidemia     Knee injury     Morbid obesity (CMS HCC)     Nerve damage     Sleep apnea     c pap; setting 3    Type 2 diabetes mellitus (CMS HCC)     dx'd 5-6 yrs ago    Wears glasses          Past Surgical History:   Procedure Laterality Date    COLONOSCOPY  11/22/2017    Dr Bryson Dames CATARACT REMOVAL Right     HX CATARACT REMOVAL Left 04/01/2019    Phacoemulsification of cataract left eye with IOL placement (04/01/2019).    HX HERNIA REPAIR      Bilateral    HX HERNIA REPAIR      HX HERNIA REPAIR      inguinal and umbilical repair     HX INCISION AND DRAINAGE  10/08/2023    posterior neck, Dr. Maximino Greenland ORCHIOPEXY      HX OTHER      at age 51 to veins twisted in groin    HX TONSILLECTOMY      HX WISDOM TEETH EXTRACTION      KNEE ARTHROSCOPY Bilateral            Current Outpatient Medications:     albuterol sulfate (PROAIR HFA) 90 mcg/actuation Inhalation oral inhaler, Take 1-2 Puffs by inhalation Every 6 hours as needed Using seasonal, Disp: , Rfl:     amLODIPine  (NORVASC) 5 mg Oral Tablet, Take 1 Tablet (5 mg total) by mouth Once a day, Disp: , Rfl:     ascorbic acid, vitamin C, (VITAMIN C) 500 mg Oral Tablet, Take 1 Tablet (500 mg total) by mouth Once a day, Disp: , Rfl:     Aspirin 81 mg Oral Tablet, take 81 mg by mouth Once a day., Disp: , Rfl:     Blood-Glucose Meter,Continuous (DEXCOM G7 RECEIVER) Does not apply Misc, As instructed, Disp: 1 Each, Rfl: 0    buPROPion (WELLBUTRIN SR) 100 mg Oral tablet sustained-release 12 hr, Take 1 Tablet (100 mg total) by mouth Once a day, Disp: , Rfl:     calcium citrate-vitamin D3 (CITRACAL) 200 mg-6.25 mcg (250 unit) Oral Tablet, Take by mouth Once a day, Disp: , Rfl:  cyanocobalamin, vitamin B-12, (VITAMIN B-12 ORAL), Take by mouth Once a day, Disp: , Rfl:     fluticasone propionate (FLONASE) 50 mcg/actuation Nasal Spray, Suspension, Administer 2 Sprays into each nostril Once a day, Disp: , Rfl:     fluticasone-salmeterol (ADVAIR) 250-50 mcg/dose Inhalation Disk with Device oral diskus inhaler, Take 1 INHALATION by inhalation Twice daily for 90 days (Patient taking differently: Take 1 INHALATION  by inhalation Twice per day as needed), Disp: 3 Inhaler, Rfl: 1    furosemide (LASIX) 40 mg Oral Tablet, TAKE ONE TABLET BY MOUTH ONCE DAILY FOR 90 DAYS, Disp: 90 Tab, Rfl: 1    insulin aspart U-100 (NOVOLOG FLEXPEN U-100 INSULIN) 100 unit/mL (3 mL) Subcutaneous Insulin Pen, PER SLIDING SCALE PROTOCOL AS DIRECTED (MAX DAILY DOSE UP TO 60 UNITS), Disp: 60 mL, Rfl: 1    insulin degludec (TRESIBA FLEXTOUCH U-100) 100 unit/mL (3 mL) Subcutaneous Insulin Pen, 70 units bedtime, Disp: 75 mL, Rfl: 1    Metformin (GLUCOPHAGE) 1,000 mg Oral Tablet, Take 1 Tablet (1,000 mg total) by mouth Twice daily, Disp: , Rfl:     metoprolol tartrate (LOPRESSOR) 25 mg Oral Tablet, Take 1 Tab (25 mg total) by mouth Every 12 hours for 90 days, Disp: 180 Tab, Rfl: 1    multivitamin Oral Tablet, Take 1 Tablet by mouth Once a day, Disp: , Rfl:     naproxen  (NAPROSYN) 500 mg Oral Tablet, Take 2 Tablets (1,000 mg total) by mouth Once a day, Disp: , Rfl:     Pen Needle, Disposable, (BD UF ORIGINAL PEN NEEDLE) 29 gauge x 1/2" Needle, USE TO INJECT INSULIN AS DIRECTED UP TO 5 TIMES DAILY, Disp: 500 Each, Rfl: 1    potassium chloride (KLOR-CON) 10 mEq Oral Tablet Sustained Release, TAKE ONE TABLET BY MOUTH ONCE DAILY WITH FOOD AND lasix, Disp: , Rfl:     sertraline (ZOLOFT) 100 mg Oral Tablet, Take 1 Tablet (100 mg total) by mouth Once a day, Disp: , Rfl:     traZODone (DESYREL) 150 mg Oral Tablet, , Disp: , Rfl:     valsartan-hydroCHLOROthiazide (DIOVAN HCT) 320-25 mg Oral Tablet, Take 1 Tab by mouth Once a day for 90 days, Disp: 90 Tab, Rfl: 0  Allergies   Allergen Reactions    Oxycodone (Bulk)      Other reaction(s): Mild hives    Tylox [Oxycodone-Acetaminophen] Hives/ Urticaria     Social History     Tobacco Use    Smoking status: Never    Smokeless tobacco: Never   Substance Use Topics    Alcohol use: No     Family History:     Family Medical History:       Problem Relation (Age of Onset)    Cancer Mother    Congestive Heart Failure Maternal Grandmother    Diabetes Paternal Uncle            ROS: Other than ROS in the HPI, all other systems were negative.    EXAM:  BP (!) 158/86   Pulse 89   Temp 36.3 C (97.3 F)   Resp 18   Ht 1.727 m (5\' 8" )   Wt (!) 138 kg (305 lb)   SpO2 93%   BMI 46.38 kg/m       General: Pleasant male in no acute distress.  HEENT: PERRL, EOMI, no visible neck masses.  Heart: RRR.  Lungs: Normal respiratory effort. Clear to auscultation bilaterally.  Abdomen: Soft, nontender, nondistended.  Skin:  Dressing  removed from right posterior head. Two I&D sites present healed nearly completely with the exception of small amount of scab on both. Erythema resolved and only minimal induration remains. Non-tender. No further dressing needed.  Extremities: Patient appears able to walk without difficulty. Hands show no clubbing or cyanosis. No  edema.      Assessment: 63 y.o. male s/p I&D abscess back of the head, doing well.     Plan:     1. Visit for wound check (Primary)  No further dressing needed.  Follow-up as needed.    2. Status post incision and drainage  See above.      Blenda Nicely, PA-C

## 2023-12-17 ENCOUNTER — Encounter (INDEPENDENT_AMBULATORY_CARE_PROVIDER_SITE_OTHER): Payer: No Typology Code available for payment source | Admitting: Family

## 2024-01-08 ENCOUNTER — Other Ambulatory Visit: Payer: Self-pay

## 2024-01-09 ENCOUNTER — Ambulatory Visit (INDEPENDENT_AMBULATORY_CARE_PROVIDER_SITE_OTHER): Payer: Self-pay | Admitting: Family

## 2024-01-09 ENCOUNTER — Encounter (INDEPENDENT_AMBULATORY_CARE_PROVIDER_SITE_OTHER): Payer: Self-pay | Admitting: Family

## 2024-01-09 ENCOUNTER — Other Ambulatory Visit: Payer: Self-pay

## 2024-01-09 VITALS — BP 136/70 | HR 89 | Resp 18 | Ht 68.0 in | Wt 296.0 lb

## 2024-01-09 DIAGNOSIS — E785 Hyperlipidemia, unspecified: Secondary | ICD-10-CM

## 2024-01-09 DIAGNOSIS — E1165 Type 2 diabetes mellitus with hyperglycemia: Secondary | ICD-10-CM

## 2024-01-09 DIAGNOSIS — I10 Essential (primary) hypertension: Secondary | ICD-10-CM

## 2024-01-09 LAB — POCT HGB A1C: POCT HGB A1C: 8 % — AB (ref 4–6)

## 2024-01-09 MED ORDER — FREESTYLE LIBRE 3 READER
0 refills | Status: AC
Start: 2024-01-09 — End: ?

## 2024-01-09 MED ORDER — FREESTYLE LIBRE 3 PLUS SENSOR DEVICE
3 refills | Status: AC
Start: 2024-01-09 — End: ?

## 2024-01-09 NOTE — Nursing Note (Signed)
 01/09/24 1514   Diabetes Care   Date of Last Hemoglobin A1c 01/09/24   Hemoglobin A1c ( external) 8.0   Height and Weight   Height 1.727 m (5\' 8" )   Weight 134 kg (296 lb)   % weight change -3 %   BSA (Calculated) 2.54   BMI (Calculated) 45.1   Ideal Body Weight (RT Use - Calculated) 68.4 kg     Arley Lah, [place]

## 2024-01-09 NOTE — Progress Notes (Signed)
 [facility]  [facility] Diabetes Progress Note    Date: 01/09/2024  Name:  [name]   Age: 63 y.o.  Attending: Barbra Ley, FNP-BC  PCP: Valentine Gasmen, NP    Assessment/Recommendations:     Uncontrolled type 2 diabetes mellitus with hyperglycemia (CMS [facility]):  Now has new insurance, able to afford Mounjaro . Sample of 5 mg weekly provided.  Check libre 3+.  Continue metformin .  Continue Tresiba , if lows overnight, change to 76 units daily.  NovoLog  5 plus 2/50 > 150 AC (if Mounjaro  5 mg weekly not strong enough ), 2/50 > 150 skip meal or bedtime.     Essential hypertension: BP controlled, Norvasc .     Hyperlipidemia, unspecified hyperlipidemia type: Crestor . LDL 30, TG 481  01/2023    CBC, BMP 09/2023, Lipids, MA, TSH (1.04)  01/2023 reviewed.     Return in about 3 months (around 04/10/2024) for In Person Visit.    Chief Complaint:    Chief Complaint   Patient presents with    Hypertension    Hyperlipidemia    Diabetes Follow up     History of Present Illness:  No f/u since 02/2023  Onset: in his early 60s  CDE: none  Complications: mild neuropathy, ? DR   A1C: 8.0% 01/09/2024<-- 12.4% 09/2023<-- 12.4% 02/21/2023<--10.1% 05/04/2022 <--9.4% 02/01/2022 <--14.4% 10/06/2021 <-- 13% 11/2020   Rx: Metformin  1000 mg bid, Mounjaro  2.5 mg weekly, Tresiba  U-200 84 units daily and Novolog  2/50> 150 SS , tried on Bydureon, tolerating Farxiga ,  Ozempic in past  FSBS: 100-300s  Diet: less CHO  Exercise: little due to knee  Hypoglycemia: none  Statin: Crestor   Eye exam:  12/2021, has f/u 01/2024  Urine microalbumin/creatinine: 288 01/2023  Foot exam:  Diabetes Monitors  A1C - Glucose - Lipids Microalbumin   Results in Last 18 Months   Lab Test 02/21/23  0000 10/07/23  0504 01/09/24  0000   HA1C  --  12.4*  --    POCTHA1C 12.4*  --  8.0*    No results for input(s): "MICALBRNUR", "MICALBCRERAT" in the last 13086 hours.     Retinal Exam Date: 01/04/2022  Last Foot Exam: Not Found      Allergies:  Allergies   Allergen  Reactions    Oxycodone (Bulk)      Other reaction(s): Mild hives    Tylox [Oxycodone-Acetaminophen ] Hives/ Urticaria       Medications:    Outpatient Medications Marked as Taking for the 01/09/24 encounter (Office Visit) with Barbra Ley, FNP-BC   Medication Sig    albuterol  sulfate (PROAIR  HFA) 90 mcg/actuation Inhalation oral inhaler Take 1-2 Puffs by inhalation Every 6 hours as needed Using seasonal    amLODIPine  (NORVASC ) 5 mg Oral Tablet Take 1 Tablet (5 mg total) by mouth Daily    ascorbic acid, vitamin C, (VITAMIN C) 500 mg Oral Tablet Take 1 Tablet (500 mg total) by mouth Daily    Aspirin  81 mg Oral Tablet Take 1 Tablet (81 mg total) by mouth Daily    Blood-Glucose Meter,Continuous (FREESTYLE LIBRE 3 READER) Does not apply Misc As instructed    Blood-Glucose Sensor (FREESTYLE LIBRE 3 PLUS SENSOR) Does not apply Device Every 15 days    buPROPion (WELLBUTRIN SR) 100 mg Oral tablet sustained-release 12 hr Take 1 Tablet (100 mg total) by mouth Daily    calcium citrate-vitamin D3 (CITRACAL) 200 mg-6.25 mcg (250 unit) Oral Tablet Take by mouth Once a day    cyanocobalamin , vitamin  B-12, (VITAMIN B-12 ORAL) Take by mouth Once a day    fluticasone  propionate (FLONASE ) 50 mcg/actuation Nasal Spray, Suspension Administer 2 Sprays into each nostril Daily    furosemide  (LASIX ) 40 mg Oral Tablet TAKE ONE TABLET BY MOUTH ONCE DAILY FOR 90 DAYS    insulin  aspart U-100 (NOVOLOG  FLEXPEN U-100 INSULIN ) 100 unit/mL (3 mL) Subcutaneous Insulin  Pen PER SLIDING SCALE PROTOCOL AS DIRECTED (MAX DAILY DOSE UP TO 60 UNITS)    Metformin  (GLUCOPHAGE ) 1,000 mg Oral Tablet Take 1 Tablet (1,000 mg total) by mouth Twice daily    multivitamin Oral Tablet Take 1 Tablet by mouth Daily    naproxen  (NAPROSYN ) 500 mg Oral Tablet Take 2 Tablets (1,000 mg total) by mouth Daily    Pen Needle, Disposable, (BD UF ORIGINAL PEN NEEDLE) 29 gauge x 1/2" Needle USE TO INJECT INSULIN  AS DIRECTED UP TO 5 TIMES DAILY    potassium chloride  (KLOR-CON ) 10 mEq  Oral Tablet Sustained Release TAKE ONE TABLET BY MOUTH ONCE DAILY WITH FOOD AND lasix     sertraline  (ZOLOFT ) 100 mg Oral Tablet Take 1 Tablet (100 mg total) by mouth Daily    tirzepatide  (MOUNJARO ) 2.5 mg/0.5 mL Subcutaneous Pen Injector Inject 0.5 mL (2.5 mg total) under the skin Every 7 days Per PCP       Past Medical History:  Past Medical History:   Diagnosis Date    Ankle injury     Cellulitis 2013    CPAP (continuous positive airway pressure) dependence     Diabetes     Dizzy spells     bradycardia    Esophageal reflux     takes tums sometimes    HTN     Hyperlipidemia     Knee injury     Morbid obesity (CMS HCC)     Nerve damage     Sleep apnea     c pap; setting 3    Type 2 diabetes mellitus     dx'd 5-6 yrs ago    Wears glasses          Past Surgical History:   Procedure Laterality Date    COLONOSCOPY  11/22/2017    Dr Arcola Beards CATARACT REMOVAL Right     HX CATARACT REMOVAL Left 04/01/2019    Phacoemulsification of cataract left eye with IOL placement (04/01/2019).    HX HERNIA REPAIR      Bilateral    HX HERNIA REPAIR      HX HERNIA REPAIR      inguinal and umbilical repair     HX INCISION AND DRAINAGE  10/08/2023    posterior neck, Dr. Alise Appl ORCHIOPEXY      HX OTHER      at age 7 to veins twisted in groin    HX TONSILLECTOMY      HX WISDOM TEETH EXTRACTION      KNEE ARTHROSCOPY Bilateral            Social History:    Social History     Socioeconomic History    Marital status: Married     Spouse name: CAROLYN    Number of children: 1   Occupational History     Employer: [employer]   Tobacco Use    Smoking status: Never    Smokeless tobacco: Never   Vaping Use    Vaping status: Never Used   Substance and Sexual Activity    Alcohol use: No  Drug use: No   Other Topics Concern    Right hand dominant Yes    Drives Yes    Seat Belt Yes    Ability to Walk 1 Flight of Steps without SOB/CP Yes    Routine Exercise No    Ability to Walk 2 Flight of Steps without SOB/CP Yes    Ability To Do  Own ADL's Yes    Other Activity Level Yes   Social History Narrative    ** Merged History Encounter **          Social Determinants of Health     Social Connections: Low Risk  (10/07/2023)    Social Connections     SDOH Social Isolation: 5 or more times a week       Family History:  Family Medical History:       Problem Relation (Age of Onset)    Cancer Mother    Congestive Heart Failure Maternal Grandmother    Diabetes Paternal Uncle              Review of Systems:  Constitutional: negative for fevers, chills, sweats, rash,  fatigue, positive for weight loss  HEENT: negative for headache, decreased vision, blurred vision.  Neck: negative for mass, pain, swelling  Respiratory: negative for cough, wheezing, dyspnea on exertion  Cardiovascular: negative for chest pain, chest pressure/discomfort,  palpitations, orthopnea, paroxysmal nocturnal dyspnea and lower extremity edema  Gastrointestinal: negative for dysphagia, reflux symptoms, nausea, vomiting, melena, diarrhea, constipation, abdominal pain  Genitourinary: negative for frequency, dysuria, nocturia, urinary incontinence, hesitancy, hematuria  Hematologic/lymphatic: negative for easy bruising or bleeding, anemia  Musculoskeletal: negative for muscle weakness, myalgias, arthralgias, back pain   Neurological: negative for headaches, dizziness, seizures, speech problems, head injury and weakness, burning, numbness   Psychiatric: negative for depression, anxiety  Endocrine: negative for polyuria, polydipsia, heat or cold intolerance    Objective     Vital Signs:  BP 136/70   Pulse 89   Resp 18   Ht 1.727 m (5\' 8" )   Wt 134 kg (296 lb)   SpO2 95%   BMI 45.01 kg/m         Examination:  General:  No acute distress  HEENT:  Eyes clear.    Neck:  Supple.  Cardiac:  Regular rate and rhythm, no gallops, murmurs or rubs.  Pulmonary:  Clear to ausculation bilaterally.  Pulmonary effort normal.  Gastrointestinal:  Nontender, nondistended, bowel sounds positive.     Musculoskeletal:  No deformity or lesions.  Skin:  Warm and of normal moisture.  Neurologic:  Alert and oriented.  Nonfocal.  No tremor.    Psychiatric:  Affect normal.  Diabetic foot exam:            Diagnostic Review:  Last BMP  (Last result in the past 2 years)        Na   K   Cl   CO2   BUN   Cr   Calcium   Glucose   Glucose-Fasting        10/09/23 0236 135  Comment: Hemolysis can alter results at this level (slight).   4.6   106   14   12   0.79   7.8  Comment: Gadolinium-containing contrast can interfere with calcium measurement.     298               Last BMP  (Last result in the past 2 years)  Na   K   Cl   CO2   BUN   Cr   Calcium   Glucose   Glucose-Fasting        10/09/23 0236 135  Comment: Hemolysis can alter results at this level (slight).   4.6   106   14   12   0.79   7.8  Comment: Gadolinium-containing contrast can interfere with calcium measurement.     298               Lab Results   Component Value Date    HA1C 12.4 (H) 10/07/2023     Lab Results   Component Value Date    TSH 1.494 10/02/2018     TSH (uIU/mL)   Date Value   10/02/2018 1.494       Barbra Ley FNP-BC

## 2024-01-21 ENCOUNTER — Other Ambulatory Visit (INDEPENDENT_AMBULATORY_CARE_PROVIDER_SITE_OTHER): Payer: Self-pay | Admitting: Family

## 2024-01-21 DIAGNOSIS — E1165 Type 2 diabetes mellitus with hyperglycemia: Secondary | ICD-10-CM

## 2024-01-21 LAB — PARACHUTE DME

## 2024-02-25 ENCOUNTER — Encounter (INDEPENDENT_AMBULATORY_CARE_PROVIDER_SITE_OTHER): Payer: Self-pay | Admitting: Family

## 2024-02-25 NOTE — Nursing Note (Signed)
 Prior auth sent to plan through cover my meds for Novolog. Awaiting results. Margo Aye, MA

## 2024-04-28 ENCOUNTER — Encounter (INDEPENDENT_AMBULATORY_CARE_PROVIDER_SITE_OTHER): Payer: Self-pay | Admitting: Family

## 2024-06-04 ENCOUNTER — Other Ambulatory Visit (INDEPENDENT_AMBULATORY_CARE_PROVIDER_SITE_OTHER): Payer: Self-pay | Admitting: Family

## 2024-06-04 DIAGNOSIS — E1165 Type 2 diabetes mellitus with hyperglycemia: Secondary | ICD-10-CM

## 2024-06-26 ENCOUNTER — Encounter (INDEPENDENT_AMBULATORY_CARE_PROVIDER_SITE_OTHER): Admitting: Family

## 2024-06-30 ENCOUNTER — Encounter (INDEPENDENT_AMBULATORY_CARE_PROVIDER_SITE_OTHER): Admitting: Family

## 2024-09-22 ENCOUNTER — Other Ambulatory Visit (HOSPITAL_COMMUNITY): Payer: Self-pay

## 2024-09-22 DIAGNOSIS — L03115 Cellulitis of right lower limb: Secondary | ICD-10-CM

## 2024-09-29 ENCOUNTER — Ambulatory Visit (HOSPITAL_BASED_OUTPATIENT_CLINIC_OR_DEPARTMENT_OTHER): Admitting: Infectious Disease
